# Patient Record
Sex: Female | Born: 1970 | Race: White | Hispanic: No | Marital: Married | Smoking: Never smoker
Health system: Southern US, Community
[De-identification: ages and names within clinical notes are randomized; demographics above are authoritative.]

## PROBLEM LIST (undated history)

## (undated) DIAGNOSIS — D219 Benign neoplasm of connective and other soft tissue, unspecified: Secondary | ICD-10-CM

## (undated) DIAGNOSIS — F419 Anxiety disorder, unspecified: Secondary | ICD-10-CM

## (undated) DIAGNOSIS — F410 Panic disorder [episodic paroxysmal anxiety] without agoraphobia: Secondary | ICD-10-CM

## (undated) DIAGNOSIS — R87612 Low grade squamous intraepithelial lesion on cytologic smear of cervix (LGSIL): Secondary | ICD-10-CM

## (undated) DIAGNOSIS — I10 Essential (primary) hypertension: Secondary | ICD-10-CM

## (undated) DIAGNOSIS — T4145XA Adverse effect of unspecified anesthetic, initial encounter: Secondary | ICD-10-CM

## (undated) HISTORY — DX: Panic disorder (episodic paroxysmal anxiety): F41.0

## (undated) HISTORY — DX: Low grade squamous intraepithelial lesion on cytologic smear of cervix (LGSIL): R87.612

## (undated) HISTORY — DX: Essential (primary) hypertension: I10

## (undated) HISTORY — PX: WISDOM TOOTH EXTRACTION: SHX21

## (undated) HISTORY — DX: Anxiety disorder, unspecified: F41.9

## (undated) HISTORY — DX: Benign neoplasm of connective and other soft tissue, unspecified: D21.9

---
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
## 1989-07-15 HISTORY — PX: CHOLECYSTECTOMY: SHX55

## 1996-07-15 HISTORY — PX: LAPAROSCOPIC OVARIAN CYSTECTOMY: SHX6248

## 1999-07-16 DIAGNOSIS — R87612 Low grade squamous intraepithelial lesion on cytologic smear of cervix (LGSIL): Secondary | ICD-10-CM

## 1999-07-16 HISTORY — DX: Low grade squamous intraepithelial lesion on cytologic smear of cervix (LGSIL): R87.612

## 1999-07-16 HISTORY — PX: LASIK: SHX215

## 2015-01-18 ENCOUNTER — Ambulatory Visit: Payer: 59 | Attending: Gynecologic Oncology | Admitting: Gynecologic Oncology

## 2015-01-18 ENCOUNTER — Encounter: Payer: Self-pay | Admitting: Gynecologic Oncology

## 2015-01-18 ENCOUNTER — Other Ambulatory Visit (HOSPITAL_COMMUNITY)
Admission: RE | Admit: 2015-01-18 | Discharge: 2015-01-18 | Disposition: A | Discharge status: Home / Self Care | Payer: 59 | Source: Ambulatory Visit | Attending: Gynecologic Oncology | Admitting: Gynecologic Oncology

## 2015-01-18 VITALS — BP 166/91 | HR 74 | Temp 98.2°F | Ht 63.5 in | Wt 183.9 lb

## 2015-01-18 DIAGNOSIS — R1900 Intra-abdominal and pelvic swelling, mass and lump, unspecified site: Secondary | ICD-10-CM | POA: Insufficient documentation

## 2015-01-18 DIAGNOSIS — N80129 Deep endometriosis of ovary, unspecified ovary: Secondary | ICD-10-CM | POA: Insufficient documentation

## 2015-01-18 DIAGNOSIS — D219 Benign neoplasm of connective and other soft tissue, unspecified: Secondary | ICD-10-CM | POA: Insufficient documentation

## 2015-01-18 DIAGNOSIS — N839 Noninflammatory disorder of ovary, fallopian tube and broad ligament, unspecified: Secondary | ICD-10-CM | POA: Diagnosis not present

## 2015-01-18 DIAGNOSIS — D259 Leiomyoma of uterus, unspecified: Secondary | ICD-10-CM | POA: Insufficient documentation

## 2015-01-18 DIAGNOSIS — Z01411 Encounter for gynecological examination (general) (routine) with abnormal findings: Secondary | ICD-10-CM | POA: Insufficient documentation

## 2015-01-18 DIAGNOSIS — R971 Elevated cancer antigen 125 [CA 125]: Secondary | ICD-10-CM | POA: Diagnosis not present

## 2015-01-18 DIAGNOSIS — Z1151 Encounter for screening for human papillomavirus (HPV): Secondary | ICD-10-CM | POA: Insufficient documentation

## 2015-01-18 DIAGNOSIS — N838 Other noninflammatory disorders of ovary, fallopian tube and broad ligament: Secondary | ICD-10-CM

## 2015-01-18 DIAGNOSIS — N801 Endometriosis of ovary: Secondary | ICD-10-CM | POA: Insufficient documentation

## 2015-01-18 NOTE — Progress Notes (Signed)
Consult Note: Gyn-Onc  Dana Livingston 44 y.o. female  CC:  Chief Complaint  Patient presents with  . ovarian mass    HPI: Patient is seen today in consultation at the request of Dr. Molli Posey.  Patient is a 44 year old gravida 0 whose last menstrual period was 01/10/2015. She continues to currently have regular cycles then about 4-5 months ago she missed 2 cycles. She took some herbal supplements and that her cycles became normal again. On May 27 when she was lying down she felt an abdominal pelvic mass. She was seen by Dr. Matthew Saras on June 23. At that time she had an ultrasound that revealed intramural fibroids measuring 3.8 cm, 2.6 cm, and 2 cm. The left ovary had a 4.4 x 3 cm cyst filled with low level echoes. It is felt that it could be an endometrioma. In addition there was a 3.4 x 2.1 cm cyst with low-level internal echoes and a thin septation which could be a hemorrhagic cyst or an endometrioma. Within the right ovary there was a 9.7 x 7 x 8.2 cm complex cyst with septations. There is no blood flow seen within the cyst. There was blood flow seen within the ovaries however there is no free fluid is CA-125 was performed it was 177.4.  It is for this reason that she is referred to [place] today. She states that since she first noticed the mass is not changed in any way. She does have some pain in the right lower quadrant. She has some urinary frequency. She's not sure that she'll need to her fibroids or this mass. She does drink a lot of teas that have dandilion that may or may not be contributing to some increased urinary frequency. She does not drink coffee as it causes GI distress but drink caffeinated beverages. She did have some issues with her stools and was worked up for this in [place]. This issue is improved completely when she stopped drinking coffee. She denies any early satiety. She's on a vegan diet. She has regular bowel movements. She weighed well over 200 pounds in the past but  modified her diet and was able to lose weight.  She has not been doing her mammograms. Her Pap smear was last performed 4 years ago. She had a Pap smear showing low-grade dysplasia in 2001.  Review of Systems  Constitutional: Denies fever. Denies any hot flashes but she states that she gets hot or more often than she used to. Skin: No rash Cardiovascular: No chest pain, shortness of breath, or edema  Pulmonary: No cough Gastro Intestinal: Reporting intermittent lower abdominal soreness.  No nausea, vomiting, constipation, or diarrhea reported.  Genitourinary: Increased urinary frequency, no urgency, or dysuria.  Cycles as above Psychology: Increased anxiety related to the mass.  Current Meds:  Outpatient Encounter Prescriptions as of 01/18/2015  Medication Sig  . ALPRAZolam (XANAX) 0.25 MG tablet TK 1 T PO  Q 8 H PRA  . Chaste Tree (VITEX EXTRACT PO) Take 2 capsules by mouth daily.  . SOY ISOFLAVONE PO Take 1 capsule by mouth daily.  . TURMERIC PO Take 2 tablets by mouth daily.   No facility-administered encounter medications on file as of 01/18/2015.    Allergy:  Allergies  Allergen Reactions  . Ciprofloxacin Palpitations  . Tetracyclines & Related Nausea And Vomiting    Social Hx:   History   Social History  . Marital Status: Married    Spouse Name: N/A  . Number of Children:  N/A  . Years of Education: N/A   Occupational History  . Not on file.   Social History Main Topics  . Smoking status: Never Smoker   . Smokeless tobacco: Not on file  . Alcohol Use: No  . Drug Use: No  . Sexual Activity: Yes   Other Topics Concern  . Not on file   Social History Narrative  . No narrative on file    Past Surgical Hx:  Past Surgical History  Procedure Laterality Date  . Cholecystectomy  1991  . Tonsillectomy  1981  . Laparoscopic ovarian cystectomy Left 1998    Past Medical Hx:  Past Medical History  Diagnosis Date  . Anxiety   . Low grade squamous intraepithelial  lesion (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 2001    normal pap smears since 2002    Oncology Hx:   No history exists.    Family Hx:  Family History  Problem Relation Age of Onset  . Thyroid disease Mother   . COPD Father   . Hypertension Father   . Diabetes Father   . Heart disease Father   . Lung cancer Maternal Grandmother   . COPD Maternal Grandfather   . Colon cancer Paternal Uncle     Vitals:  Blood pressure 166/91, pulse 74, temperature 98.2 F (36.8 C), temperature source Oral, height 5' 3.5" (1.613 m), weight 183 lb 14.4 oz (83.416 kg), SpO2 98 %.  Physical Exam: Well-nourished well-developed female in no acute distress.  Neck: Supple, no lymphadenopathy, no thyromegaly.  Lungs: Clear to auscultation bilaterally. Cardiac: Regular rate and rhythm.  Abdomen: Well-healed right upper quadrant transverse incision. No evidence of an incisional hernia. No fluid wave. Abdomen is soft, nontender, nondistended. Within the right lower quadrant right midabdomen there is approximately 10 cm abdominal pelvic mass that is palpable. It is freely mobile.  Groins: No lymphadenopathy.  Extremity: No edema.  Pelvic: Normal external genitalia. The vagina is well epithelialized. The cervix is visualized. Is nulliparous. There's no visible lesions. Pap smear was submitted without difficulty. Bimanual examination reveals the uterus to be mid plane. It is slightly distended measuring approximately 10 weeks size. There is a mass palpable above the uterus it is mobile with the uterus and appears to be separate likely consistent with a right ovarian mass. There is no mass appreciated on the left. Rectal confirms. There is no nodularity.  Assessment/Plan: 44 year old with a complex 9 cm right ovarian mass and a smaller left ovarian masses felt to be an endometrioma. She has known uterine fibroids and has had thwm for some time. Her CA-125 is elevated 177. I reviewed all of this with the patient and her  husband. Greater than 30 minutes face to face time with counseling alone was spent. I discussed with her that I believe that the risk of malignancy is low (proximally 5%) but not 0 and I would recommend surgical evaluation. I discussed with him proceeding with a robotic-assisted right oophorectomy. I do not believe we could proceed with an ovarian cystectomy without risk of rupture. The ovary be delivered through the abdomen except for frozen section. If the ovaries benign, we will evaluate the left ovary referred for consideration of a cystectomy and then conclude the procedure she will be able to go home. If the right ovary has a malignancy I would recommend hysterectomy with removal of the contralateral ovary and fallopian tube and surgical staging.  She had a lot of questions regarding this and they were addressed. We'll  address the role of performing a hysterectomy in the setting of cancer and she ultimately agreed. We discussed the role of preoperative imaging with a CT scan. Based on her exam and the fact that there was no significant free fluid on the ultrasound identified do not believe that a CT was significantly change our surgical approach. She would not be able to proceed in a minimally invasive fashion due to adhesive disease or other factors we would proceed with a laparotomy. She was able to undergo left ovarian cystectomy after her laparotomy there was no significant adhesive disease based on her report.  I did discuss with them that I will be out of the country starting this coming Sunday will not be back to operate until August 2. She was offered surgery in [place] with one of my partners with an earlier date. We otherwise do not have any OR availability here in [place] until August 2. She and her husband will call us back letting us know how they wish to proceed.  We discussed length of stay. If her surgery is consistent only with an oophorectomy should be able to go home the same  day. If we need to proceed with more extensive surgery she notes that she would be in the hospital overnight and discharge when she meets postoperative course. We discussed FMLA paperwork and that we are happy to complete that for her if she send it to [place]. Again, her questions were elicited in answer to her satisfaction. She and her husband will notify us once he make a decision regarding the timing of surgery and where they would like to have it performed. She did express interest in having me to her surgery as she had the opportunity to meet with me today.  Nehal Witting A., MD 01/18/2015, 3:54 PM

## 2015-01-18 NOTE — Patient Instructions (Signed)
Please contact our office at [phone] with your decision to proceed with surgery in [facility] on August 2 with Dr. Alycia Rossetti or sooner at [facility].

## 2015-01-20 ENCOUNTER — Telehealth: Payer: Self-pay | Admitting: *Deleted

## 2015-01-20 LAB — CYTOLOGY - PAP

## 2015-01-20 NOTE — Telephone Encounter (Signed)
Notified pt Pap smear results were normal. Pt verbalized understanding no further concerns.

## 2015-02-08 NOTE — Patient Instructions (Addendum)
Dana Livingston  02/08/2015   Your procedure is scheduled on: Tuesday 02/14/2015  Report to [facility] Main  Entrance take [facility]  elevators to 3rd floor to  [place] at [place] AM.  Call this number if you have problems the morning of surgery [phone]   Remember: ONLY 1 PERSON MAY GO WITH YOU TO SHORT STAY TO GET  READY MORNING OF [place].  Do not eat food or drink liquids :After Midnight.               PLEASE FOLLOW CLEAR LIQUID DIET ALL DAY ON Monday 02/13/2015 TIL MIDNIGHT!   Take these medicines the morning of surgery with A SIP OF WATER: none                               You may not have any metal on your body including hair pins and              piercings  Do not wear jewelry, make-up, lotions, powders or perfumes, deodorant             Do not wear nail polish.  Do not shave  48 hours prior to surgery.              Men may shave face and neck.   Do not bring valuables to the hospital. [place].  Contacts, dentures or bridgework may not be worn into surgery.  Leave suitcase in the car. After surgery it may be brought to your room.     Patients discharged the day of surgery will not be allowed to drive home.  Name and phone number of your driver:  Special Instructions: N/A              Please read over the following fact sheets you were given: _____________________________________________________________________             [facility] - Preparing for Surgery Before surgery, you can play an important role.  Because skin is not sterile, your skin needs to be as free of germs as possible.  You can reduce the number of germs on your skin by washing with CHG (chlorahexidine gluconate) soap before surgery.  CHG is an antiseptic cleaner which kills germs and bonds with the skin to continue killing germs even after washing. Please DO NOT use if you have an allergy to CHG or antibacterial soaps.   If your skin becomes reddened/irritated stop using the CHG and inform your nurse when you arrive at Short Stay. Do not shave (including legs and underarms) for at least 48 hours prior to the first CHG shower.  You may shave your face/neck. Please follow these instructions carefully:  1.  Shower with CHG Soap the night before surgery and the  morning of Surgery.  2.  If you choose to wash your hair, wash your hair first as usual with your  normal  shampoo.  3.  After you shampoo, rinse your hair and body thoroughly to remove the  shampoo.                           4.  Use CHG as you would any other liquid soap.  You  can apply chg directly  to the skin and wash                       Gently with a scrungie or clean washcloth.  5.  Apply the CHG Soap to your body ONLY FROM THE NECK DOWN.   Do not use on face/ open                           Wound or open sores. Avoid contact with eyes, ears mouth and genitals (private parts).                       Wash face,  Genitals (private parts) with your normal soap.             6.  Wash thoroughly, paying special attention to the area where your surgery  will be performed.  7.  Thoroughly rinse your body with warm water from the neck down.  8.  DO NOT shower/wash with your normal soap after using and rinsing off  the CHG Soap.                9.  Pat yourself dry with a clean towel.            10.  Wear clean pajamas.            11.  Place clean sheets on your bed the night of your first shower and do not  sleep with pets. Day of Surgery : Do not apply any lotions/deodorants the morning of surgery.  Please wear clean clothes to the hospital/surgery center.  FAILURE TO FOLLOW THESE INSTRUCTIONS MAY RESULT IN THE CANCELLATION OF YOUR SURGERY PATIENT SIGNATURE_________________________________  NURSE SIGNATURE__________________________________  ________________________________________________________________________   Adam Phenix  An incentive  spirometer is a tool that can help keep your lungs clear and active. This tool measures how well you are filling your lungs with each breath. Taking long deep breaths may help reverse or decrease the chance of developing breathing (pulmonary) problems (especially infection) following:  A long period of time when you are unable to move or be active. BEFORE THE PROCEDURE   If the spirometer includes an indicator to show your best effort, your nurse or respiratory therapist will set it to a desired goal.  If possible, sit up straight or lean slightly forward. Try not to slouch.  Hold the incentive spirometer in an upright position. INSTRUCTIONS FOR USE   Sit on the edge of your bed if possible, or sit up as far as you can in bed or on a chair.  Hold the incentive spirometer in an upright position.  Breathe out normally.  Place the mouthpiece in your mouth and seal your lips tightly around it.  Breathe in slowly and as deeply as possible, raising the piston or the ball toward the top of the column.  Hold your breath for 3-5 seconds or for as long as possible. Allow the piston or ball to fall to the bottom of the column.  Remove the mouthpiece from your mouth and breathe out normally.  Rest for a few seconds and repeat Steps 1 through 7 at least 10 times every 1-2 hours when you are awake. Take your time and take a few normal breaths between deep breaths.  The spirometer may include an indicator to show your best effort. Use the indicator as a goal to work toward during  each repetition.  After each set of 10 deep breaths, practice coughing to be sure your lungs are clear. If you have an incision (the cut made at the time of surgery), support your incision when coughing by placing a pillow or rolled up towels firmly against it. Once you are able to get out of bed, walk around indoors and cough well. You may stop using the incentive spirometer when instructed by your caregiver.  RISKS AND  COMPLICATIONS  Take your time so you do not get dizzy or light-headed.  If you are in pain, you may need to take or ask for pain medication before doing incentive spirometry. It is harder to take a deep breath if you are having pain. AFTER USE  Rest and breathe slowly and easily.  It can be helpful to keep track of a log of your progress. Your caregiver can provide you with a simple table to help with this. If you are using the spirometer at home, follow these instructions: [place] IF:   You are having difficultly using the spirometer.  You have trouble using the spirometer as often as instructed.  Your pain medication is not giving enough relief while using the spirometer.  You develop fever of 100.5 F (38.1 C) or higher. SEEK IMMEDIATE MEDICAL CARE IF:   You cough up bloody sputum that had not been present before.  You develop fever of 102 F (38.9 C) or greater.  You develop worsening pain at or near the incision site. MAKE SURE YOU:   Understand these instructions.  Will watch your condition.  Will get help right away if you are not doing well or get worse. Document Released: 11/11/2006 Document Revised: 09/23/2011 Document Reviewed: 01/12/2007 ExitCare Patient Information 2014 ExitCare, [place].   ________________________________________________________________________  WHAT IS A BLOOD TRANSFUSION? Blood Transfusion Information  A transfusion is the replacement of blood or some of its parts. Blood is made up of multiple cells which provide different functions.  Red blood cells carry oxygen and are used for blood loss replacement.  White blood cells fight against infection.  Platelets control bleeding.  Plasma helps clot blood.  Other blood products are available for specialized needs, such as hemophilia or other clotting disorders. BEFORE THE TRANSFUSION  Who gives blood for transfusions?   Healthy volunteers who are fully evaluated to make sure  their blood is safe. This is blood bank blood. Transfusion therapy is the safest it has ever been in the practice of medicine. Before blood is taken from a donor, a complete history is taken to make sure that person has no history of diseases nor engages in risky social behavior (examples are intravenous drug use or sexual activity with multiple partners). The donor's travel history is screened to minimize risk of transmitting infections, such as malaria. The donated blood is tested for signs of infectious diseases, such as HIV and hepatitis. The blood is then tested to be sure it is compatible with you in order to minimize the chance of a transfusion reaction. If you or a relative donates blood, this is often done in anticipation of surgery and is not appropriate for emergency situations. It takes many days to process the donated blood. RISKS AND COMPLICATIONS Although transfusion therapy is very safe and saves many lives, the main dangers of transfusion include:   Getting an infectious disease.  Developing a transfusion reaction. This is an allergic reaction to something in the blood you were given. Every precaution is taken to prevent  this. The decision to have a blood transfusion has been considered carefully by your caregiver before blood is given. Blood is not given unless the benefits outweigh the risks. AFTER THE TRANSFUSION  Right after receiving a blood transfusion, you will usually feel much better and more energetic. This is especially true if your red blood cells have gotten low (anemic). The transfusion raises the level of the red blood cells which carry oxygen, and this usually causes an energy increase.  The nurse administering the transfusion will monitor you carefully for complications. HOME CARE INSTRUCTIONS  No special instructions are needed after a transfusion. You may find your energy is better. Speak with your caregiver about any limitations on activity for underlying diseases  you may have. SEEK MEDICAL CARE IF:   Your condition is not improving after your transfusion.  You develop redness or irritation at the intravenous (IV) site. SEEK IMMEDIATE MEDICAL CARE IF:  Any of the following symptoms occur over the next 12 hours:  Shaking chills.  You have a temperature by mouth above 102 F (38.9 C), not controlled by medicine.  Chest, back, or muscle pain.  People around you feel you are not acting correctly or are confused.  Shortness of breath or difficulty breathing.  Dizziness and fainting.  You get a rash or develop hives.  You have a decrease in urine output.  Your urine turns a dark color or changes to pink, red, or brown. Any of the following symptoms occur over the next 10 days:  You have a temperature by mouth above 102 F (38.9 C), not controlled by medicine.  Shortness of breath.  Weakness after normal activity.  The white part of the eye turns yellow (jaundice).  You have a decrease in the amount of urine or are urinating less often.  Your urine turns a dark color or changes to pink, red, or brown. Document Released: 06/28/2000 Document Revised: 09/23/2011 Document Reviewed: 02/15/2008 ExitCare Patient Information 2014 [place], [place].  _______________________________________________________________________   CLEAR LIQUID DIET       -Follow day before surgery on Monday 02/13/2015   Foods Allowed                                                                     Foods Excluded  Coffee and tea, regular and decaf                             liquids that you cannot  Plain Jell-O in any flavor                                             see through such as: Fruit ices (not with fruit pulp)                                     milk, soups, orange juice  Iced Popsicles  All solid food Carbonated beverages, regular and diet                                    Cranberry, grape and apple  juices Sports drinks like Gatorade Lightly seasoned clear broth or consume(fat free) Sugar, honey syrup  Sample Menu Breakfast                                Lunch                                     Supper Cranberry juice                    Beef broth                            Chicken broth Jell-O                                     Grape juice                           Apple juice Coffee or tea                        Jell-O                                      Popsicle                                                Coffee or tea                        Coffee or tea  _____________________________________________________________________

## 2015-02-09 ENCOUNTER — Encounter (HOSPITAL_COMMUNITY): Payer: Self-pay

## 2015-02-09 ENCOUNTER — Encounter (HOSPITAL_COMMUNITY)
Admission: RE | Admit: 2015-02-09 | Discharge: 2015-02-09 | Disposition: A | Discharge status: Home / Self Care | Payer: 59 | Source: Ambulatory Visit | Attending: Obstetrics & Gynecology | Admitting: Obstetrics & Gynecology

## 2015-02-09 DIAGNOSIS — Z3200 Encounter for pregnancy test, result unknown: Secondary | ICD-10-CM | POA: Diagnosis not present

## 2015-02-09 HISTORY — DX: Adverse effect of unspecified anesthetic, initial encounter: T41.45XA

## 2015-02-09 LAB — URINALYSIS, ROUTINE W REFLEX MICROSCOPIC
Bilirubin Urine: NEGATIVE
Glucose, UA: NEGATIVE mg/dL
Ketones, ur: NEGATIVE mg/dL
LEUKOCYTES UA: NEGATIVE
NITRITE: NEGATIVE
PH: 6 (ref 5.0–8.0)
PROTEIN: NEGATIVE mg/dL
Specific Gravity, Urine: 1.006 (ref 1.005–1.030)
Urobilinogen, UA: 0.2 mg/dL (ref 0.0–1.0)

## 2015-02-09 LAB — COMPREHENSIVE METABOLIC PANEL
ALT: 12 U/L — ABNORMAL LOW (ref 14–54)
AST: 19 U/L (ref 15–41)
Albumin: 4.1 g/dL (ref 3.5–5.0)
Alkaline Phosphatase: 77 U/L (ref 38–126)
Anion gap: 6 (ref 5–15)
BUN: 11 mg/dL (ref 6–20)
CO2: 25 mmol/L (ref 22–32)
Calcium: 9.1 mg/dL (ref 8.9–10.3)
Chloride: 106 mmol/L (ref 101–111)
Creatinine, Ser: 0.67 mg/dL (ref 0.44–1.00)
GFR calc Af Amer: >60 mL/min (ref >60)
GLUCOSE: 97 mg/dL (ref 65–99)
POTASSIUM: 4.1 mmol/L (ref 3.5–5.1)
SODIUM: 137 mmol/L (ref 135–145)
Total Bilirubin: 0.4 mg/dL (ref 0.3–1.2)
Total Protein: 7.3 g/dL (ref 6.5–8.1)

## 2015-02-09 LAB — CBC WITH DIFFERENTIAL/PLATELET
Basophils Absolute: 0.1 10*3/uL (ref 0.0–0.1)
Basophils Relative: 1 % (ref 0–1)
Eosinophils Absolute: 0.3 10*3/uL (ref 0.0–0.7)
Eosinophils Relative: 3 % (ref 0–5)
HCT: 35.6 % — ABNORMAL LOW (ref 36.0–46.0)
HEMOGLOBIN: 11.5 g/dL — AB (ref 12.0–15.0)
Lymphocytes Relative: 19 % (ref 12–46)
Lymphs Abs: 1.9 10*3/uL (ref 0.7–4.0)
MCH: 28.2 pg (ref 26.0–34.0)
MCHC: 32.3 g/dL (ref 30.0–36.0)
MCV: 87.3 fL (ref 78.0–100.0)
Monocytes Absolute: 0.9 10*3/uL (ref 0.1–1.0)
Monocytes Relative: 9 % (ref 3–12)
NEUTROS ABS: 6.8 10*3/uL (ref 1.7–7.7)
Neutrophils Relative %: 68 % (ref 43–77)
PLATELETS: 338 10*3/uL (ref 150–400)
RBC: 4.08 MIL/uL (ref 3.87–5.11)
RDW: 12.6 % (ref 11.5–15.5)
WBC: 9.9 10*3/uL (ref 4.0–10.5)

## 2015-02-09 LAB — URINE MICROSCOPIC-ADD ON

## 2015-02-09 LAB — PREGNANCY, URINE: PREG TEST UR: NEGATIVE

## 2015-02-09 LAB — ABO/RH: ABO/RH(D): O POS

## 2015-02-14 ENCOUNTER — Ambulatory Visit (HOSPITAL_COMMUNITY): Payer: 59 | Admitting: Anesthesiology

## 2015-02-14 ENCOUNTER — Encounter (HOSPITAL_COMMUNITY)
Admission: RE | Disposition: A | Discharge status: Home / Self Care | Payer: Self-pay | Source: Ambulatory Visit | Attending: Obstetrics & Gynecology

## 2015-02-14 ENCOUNTER — Encounter (HOSPITAL_COMMUNITY): Payer: Self-pay | Admitting: Anesthesiology

## 2015-02-14 ENCOUNTER — Ambulatory Visit (HOSPITAL_COMMUNITY)
Admission: RE | Admit: 2015-02-14 | Discharge: 2015-02-14 | Disposition: A | Discharge status: Home / Self Care | Payer: 59 | Source: Ambulatory Visit | Attending: Obstetrics & Gynecology | Admitting: Obstetrics & Gynecology

## 2015-02-14 DIAGNOSIS — D252 Subserosal leiomyoma of uterus: Secondary | ICD-10-CM

## 2015-02-14 DIAGNOSIS — N801 Endometriosis of ovary: Secondary | ICD-10-CM | POA: Diagnosis not present

## 2015-02-14 DIAGNOSIS — D259 Leiomyoma of uterus, unspecified: Secondary | ICD-10-CM | POA: Diagnosis present

## 2015-02-14 DIAGNOSIS — R971 Elevated cancer antigen 125 [CA 125]: Secondary | ICD-10-CM

## 2015-02-14 DIAGNOSIS — N809 Endometriosis, unspecified: Secondary | ICD-10-CM | POA: Diagnosis not present

## 2015-02-14 DIAGNOSIS — N802 Endometriosis of fallopian tube: Secondary | ICD-10-CM | POA: Insufficient documentation

## 2015-02-14 DIAGNOSIS — N838 Other noninflammatory disorders of ovary, fallopian tube and broad ligament: Secondary | ICD-10-CM

## 2015-02-14 HISTORY — PX: ROBOTIC ASSISTED LAPAROSCOPIC OVARIAN CYSTECTOMY: SHX6081

## 2015-02-14 HISTORY — PX: ROBOTIC ASSISTED SALPINGO OOPHERECTOMY: SHX6082

## 2015-02-14 HISTORY — PX: ROBOTIC ASSISTED TOTAL HYSTERECTOMY WITH BILATERAL SALPINGO OOPHERECTOMY: SHX6086

## 2015-02-14 LAB — TYPE AND SCREEN
ABO/RH(D): O POS
Antibody Screen: NEGATIVE

## 2015-02-14 SURGERY — SALPINGO-OOPHORECTOMY, ROBOT-ASSISTED
Anesthesia: General | Site: Abdomen | Laterality: Right

## 2015-02-14 MED ORDER — KETOROLAC TROMETHAMINE 15 MG/ML IJ SOLN: 15.0000 mg | Freq: Four times a day (QID) | INTRAMUSCULAR | Status: DC

## 2015-02-14 MED ORDER — GLYCOPYRROLATE 0.2 MG/ML IJ SOLN
INTRAMUSCULAR | Status: DC | PRN
  Administered 2015-02-14: .6 mg via INTRAVENOUS

## 2015-02-14 MED ORDER — ROCURONIUM BROMIDE 100 MG/10ML IV SOLN
INTRAVENOUS | Status: AC
  Filled 2015-02-14: qty 1

## 2015-02-14 MED ORDER — MORPHINE SULFATE 10 MG/ML IJ SOLN: 2.0000 mg | INTRAMUSCULAR | Status: DC | PRN

## 2015-02-14 MED ORDER — GLYCOPYRROLATE 0.2 MG/ML IJ SOLN
INTRAMUSCULAR | Status: AC
Start: 2015-02-14 — End: 2015-02-14
  Filled 2015-02-14: qty 3

## 2015-02-14 MED ORDER — FENTANYL CITRATE (PF) 250 MCG/5ML IJ SOLN
INTRAMUSCULAR | Status: AC
  Filled 2015-02-14: qty 25

## 2015-02-14 MED ORDER — FENTANYL CITRATE (PF) 100 MCG/2ML IJ SOLN
25.0000 ug | INTRAMUSCULAR | Status: DC | PRN
  Administered 2015-02-14 (×2): 25 ug via INTRAVENOUS
  Administered 2015-02-14: 50 ug via INTRAVENOUS

## 2015-02-14 MED ORDER — FENTANYL CITRATE (PF) 100 MCG/2ML IJ SOLN
INTRAMUSCULAR | Status: DC | PRN
  Administered 2015-02-14: 100 ug via INTRAVENOUS
  Administered 2015-02-14 (×3): 50 ug via INTRAVENOUS

## 2015-02-14 MED ORDER — PROPOFOL 10 MG/ML IV BOLUS
INTRAVENOUS | Status: DC | PRN
  Administered 2015-02-14: 160 mg via INTRAVENOUS

## 2015-02-14 MED ORDER — LIDOCAINE HCL (CARDIAC) 20 MG/ML IV SOLN
INTRAVENOUS | Status: AC
  Filled 2015-02-14: qty 5

## 2015-02-14 MED ORDER — MIDAZOLAM HCL 2 MG/2ML IJ SOLN
INTRAMUSCULAR | Status: AC
  Filled 2015-02-14: qty 4

## 2015-02-14 MED ORDER — NEOSTIGMINE METHYLSULFATE 10 MG/10ML IV SOLN
INTRAVENOUS | Status: AC
  Filled 2015-02-14: qty 1

## 2015-02-14 MED ORDER — LIDOCAINE HCL (CARDIAC) 20 MG/ML IV SOLN
INTRAVENOUS | Status: DC | PRN
  Administered 2015-02-14: 50 mg via INTRAVENOUS

## 2015-02-14 MED ORDER — MIDAZOLAM HCL 5 MG/5ML IJ SOLN
INTRAMUSCULAR | Status: DC | PRN
  Administered 2015-02-14: 2 mg via INTRAVENOUS

## 2015-02-14 MED ORDER — KETOROLAC TROMETHAMINE 15 MG/ML IJ SOLN
15.0000 mg | Freq: Four times a day (QID) | INTRAMUSCULAR | Status: DC
  Administered 2015-02-14: 15 mg via INTRAVENOUS
  Filled 2015-02-14: qty 1

## 2015-02-14 MED ORDER — STERILE WATER FOR IRRIGATION IR SOLN
Status: DC | PRN
  Administered 2015-02-14: 1000 mL

## 2015-02-14 MED ORDER — MEPERIDINE HCL 50 MG/ML IJ SOLN: 6.2500 mg | INTRAMUSCULAR | Status: DC | PRN

## 2015-02-14 MED ORDER — LACTATED RINGERS IV SOLN: INTRAVENOUS | Status: DC

## 2015-02-14 MED ORDER — PROPOFOL 10 MG/ML IV BOLUS
INTRAVENOUS | Status: AC
  Filled 2015-02-14: qty 20

## 2015-02-14 MED ORDER — NEOSTIGMINE METHYLSULFATE 10 MG/10ML IV SOLN
INTRAVENOUS | Status: DC | PRN
  Administered 2015-02-14: 3.5 mg via INTRAVENOUS

## 2015-02-14 MED ORDER — HYDROMORPHONE HCL 1 MG/ML IJ SOLN
INTRAMUSCULAR | Status: DC | PRN
  Administered 2015-02-14: 0.5 mg via INTRAVENOUS
  Administered 2015-02-14: 1 mg via INTRAVENOUS
  Administered 2015-02-14: 0.5 mg via INTRAVENOUS

## 2015-02-14 MED ORDER — DEXAMETHASONE SODIUM PHOSPHATE 10 MG/ML IJ SOLN
INTRAMUSCULAR | Status: DC | PRN
  Administered 2015-02-14: 10 mg via INTRAVENOUS

## 2015-02-14 MED ORDER — LACTATED RINGERS IR SOLN
Status: DC | PRN
  Administered 2015-02-14: 1000 mL

## 2015-02-14 MED ORDER — FENTANYL CITRATE (PF) 100 MCG/2ML IJ SOLN
INTRAMUSCULAR | Status: AC
  Filled 2015-02-14: qty 2

## 2015-02-14 MED ORDER — PROMETHAZINE HCL 25 MG/ML IJ SOLN: 6.2500 mg | INTRAMUSCULAR | Status: DC | PRN

## 2015-02-14 MED ORDER — ROCURONIUM BROMIDE 100 MG/10ML IV SOLN
INTRAVENOUS | Status: DC | PRN
  Administered 2015-02-14 (×2): 10 mg via INTRAVENOUS
  Administered 2015-02-14: 50 mg via INTRAVENOUS
  Administered 2015-02-14: 20 mg via INTRAVENOUS

## 2015-02-14 MED ORDER — ACETAMINOPHEN 325 MG PO TABS: 650.0000 mg | ORAL_TABLET | ORAL | Status: DC | PRN

## 2015-02-14 MED ORDER — ACETAMINOPHEN 650 MG RE SUPP
650.0000 mg | RECTAL | Status: DC | PRN
  Filled 2015-02-14: qty 1

## 2015-02-14 MED ORDER — SODIUM CHLORIDE 0.9 % IJ SOLN: 3.0000 mL | Freq: Two times a day (BID) | INTRAMUSCULAR | Status: DC

## 2015-02-14 MED ORDER — METOCLOPRAMIDE HCL 5 MG/ML IJ SOLN
10.0000 mg | Freq: Once | INTRAMUSCULAR | Status: AC
  Administered 2015-02-14: 10 mg via INTRAVENOUS
  Filled 2015-02-14: qty 2

## 2015-02-14 MED ORDER — ONDANSETRON HCL 4 MG/2ML IJ SOLN
INTRAMUSCULAR | Status: AC
  Filled 2015-02-14: qty 2

## 2015-02-14 MED ORDER — HYDROMORPHONE HCL 2 MG/ML IJ SOLN
INTRAMUSCULAR | Status: AC
  Filled 2015-02-14: qty 1

## 2015-02-14 MED ORDER — SODIUM CHLORIDE 0.9 % IV SOLN: 250.0000 mL | INTRAVENOUS | Status: DC | PRN

## 2015-02-14 MED ORDER — SODIUM CHLORIDE 0.9 % IJ SOLN: 3.0000 mL | INTRAMUSCULAR | Status: DC | PRN

## 2015-02-14 MED ORDER — ONDANSETRON HCL 4 MG/2ML IJ SOLN
INTRAMUSCULAR | Status: DC | PRN
  Administered 2015-02-14: 4 mg via INTRAVENOUS

## 2015-02-14 MED ORDER — OXYCODONE-ACETAMINOPHEN 5-325 MG PO TABS: 1.0000 | ORAL_TABLET | Freq: Four times a day (QID) | ORAL | Status: DC | PRN

## 2015-02-14 MED ORDER — LACTATED RINGERS IV SOLN
INTRAVENOUS | Status: DC
  Administered 2015-02-14: 1000 mL via INTRAVENOUS
  Administered 2015-02-14: 10:00:00 via INTRAVENOUS

## 2015-02-14 MED ORDER — OXYCODONE HCL 5 MG PO TABS
5.0000 mg | ORAL_TABLET | ORAL | Status: DC | PRN
  Administered 2015-02-14: 5 mg via ORAL
  Filled 2015-02-14: qty 1

## 2015-02-14 SURGICAL SUPPLY — 53 items
BENZOIN TINCTURE PRP APPL 2/3 (GAUZE/BANDAGES/DRESSINGS) ×4 IMPLANT
CABLE HIGH FREQUENCY MONO STRZ (ELECTRODE) ×4 IMPLANT
CHLORAPREP W/TINT 26ML (MISCELLANEOUS) ×4 IMPLANT
CORDS BIPOLAR (ELECTRODE) ×4 IMPLANT
COVER SURGICAL LIGHT HANDLE (MISCELLANEOUS) ×4 IMPLANT
COVER TIP SHEARS 8 DVNC (MISCELLANEOUS) ×3 IMPLANT
COVER TIP SHEARS 8MM DA VINCI (MISCELLANEOUS) ×1
DRAPE SHEET LG 3/4 BI-LAMINATE (DRAPES) ×8 IMPLANT
DRAPE SURG IRRIG POUCH 19X23 (DRAPES) ×4 IMPLANT
DRAPE TABLE BACK 44X90 PK DISP (DRAPES) ×8 IMPLANT
DRAPE UTILITY XL STRL (DRAPES) ×4 IMPLANT
DRAPE WARM FLUID 44X44 (DRAPE) ×4 IMPLANT
DRSG TEGADERM 2-3/8X2-3/4 SM (GAUZE/BANDAGES/DRESSINGS) ×16 IMPLANT
DRSG TEGADERM 4X4.75 (GAUZE/BANDAGES/DRESSINGS) ×4 IMPLANT
DRSG TEGADERM 6X8 (GAUZE/BANDAGES/DRESSINGS) ×4 IMPLANT
ELECT REM PT RETURN 9FT ADLT (ELECTROSURGICAL) ×4
ELECTRODE REM PT RTRN 9FT ADLT (ELECTROSURGICAL) ×3 IMPLANT
GAUZE SPONGE 2X2 8PLY STRL LF (GAUZE/BANDAGES/DRESSINGS) ×3 IMPLANT
GLOVE BIO SURGEON STRL SZ 6.5 (GLOVE) ×16 IMPLANT
GLOVE BIO SURGEON STRL SZ7.5 (GLOVE) IMPLANT
GLOVE BIOGEL PI IND STRL 7.0 (GLOVE) ×6 IMPLANT
GLOVE BIOGEL PI INDICATOR 7.0 (GLOVE) ×2
GOWN STRL REUS W/ TWL XL LVL3 (GOWN DISPOSABLE) ×6 IMPLANT
GOWN STRL REUS W/TWL XL LVL3 (GOWN DISPOSABLE) ×2
HOLDER FOLEY CATH W/STRAP (MISCELLANEOUS) ×4 IMPLANT
KIT ACCESSORY DA VINCI DISP (KITS) ×1
KIT ACCESSORY DVNC DISP (KITS) ×3 IMPLANT
KIT BASIN OR (CUSTOM PROCEDURE TRAY) ×4 IMPLANT
MANIPULATOR UTERINE 4.5 ZUMI (MISCELLANEOUS) ×4 IMPLANT
OCCLUDER COLPOPNEUMO (BALLOONS) ×4 IMPLANT
POUCH SPECIMEN RETRIEVAL 10MM (ENDOMECHANICALS) ×4 IMPLANT
SET TUBE IRRIG SUCTION NO TIP (IRRIGATION / IRRIGATOR) ×4 IMPLANT
SHEET LAVH (DRAPES) ×4 IMPLANT
SOLUTION ANTI FOG 6CC (MISCELLANEOUS) ×4 IMPLANT
SOLUTION ELECTROLUBE (MISCELLANEOUS) ×4 IMPLANT
SPONGE GAUZE 2X2 STER 10/PKG (GAUZE/BANDAGES/DRESSINGS) ×1
SPONGE LAP 18X18 X RAY DECT (DISPOSABLE) IMPLANT
STRIP CLOSURE SKIN 1/2X4 (GAUZE/BANDAGES/DRESSINGS) IMPLANT
SUT VIC AB 0 CT1 27 (SUTURE) ×1
SUT VIC AB 0 CT1 27XBRD ANTBC (SUTURE) ×3 IMPLANT
SUT VIC AB 4-0 PS2 27 (SUTURE) ×8 IMPLANT
SUT VICRYL 0 UR6 27IN ABS (SUTURE) ×4 IMPLANT
SYR 50ML LL SCALE MARK (SYRINGE) ×4 IMPLANT
SYR BULB IRRIGATION 50ML (SYRINGE) IMPLANT
TOWEL OR 17X26 10 PK STRL BLUE (TOWEL DISPOSABLE) ×8 IMPLANT
TRAP SPECIMEN MUCOUS 40CC (MISCELLANEOUS) ×4 IMPLANT
TRAY FOLEY W/METER SILVER 14FR (SET/KITS/TRAYS/PACK) ×4 IMPLANT
TRAY LAPAROSCOPIC (CUSTOM PROCEDURE TRAY) ×4 IMPLANT
TROCAR 12M 150ML BLUNT (TROCAR) ×4 IMPLANT
TROCAR BLADELESS OPT 5 100 (ENDOMECHANICALS) ×4 IMPLANT
TROCAR XCEL 12X100 BLDLESS (ENDOMECHANICALS) ×4 IMPLANT
TUBING INSUFFLATION 10FT LAP (TUBING) ×4 IMPLANT
WATER STERILE IRR 1500ML POUR (IV SOLUTION) IMPLANT

## 2015-02-14 NOTE — Anesthesia Postprocedure Evaluation (Signed)
  Anesthesia Post-op Note  Patient: Dana Livingston  Procedure(s) Performed: Procedure(s) (LRB): ROBOTIC ASSISTED RIGHT OOPHORECTOMY  (Bilateral)  Patient Location: PACU  Anesthesia Type: General  Level of Consciousness: awake and alert   Airway and Oxygen Therapy: Patient Spontanous Breathing  Post-op Pain: mild  Post-op Assessment: Post-op Vital signs reviewed, Patient's Cardiovascular Status Stable, Respiratory Function Stable, Patent Airway and No signs of Nausea or vomiting  Last Vitals:  Filed Vitals:   02/14/15 1152  BP: 129/70  Pulse: 61  Temp: 36.4 C  Resp: 14    Post-op Vital Signs: stable   Complications: No apparent anesthesia complications

## 2015-02-14 NOTE — Transfer of Care (Signed)
Immediate Anesthesia Transfer of Care Note  Patient: Dana Livingston  Procedure(s) Performed: Procedure(s): ROBOTIC ASSISTED RIGHT OOPHORECTOMY  (Bilateral)  Patient Location: PACU  Anesthesia Type:General  Level of Consciousness:  sedated, patient cooperative and responds to stimulation  Airway & Oxygen Therapy:Patient Spontanous Breathing and Patient connected to face mask oxgen  Post-op Assessment:  Report given to PACU RN and Post -op Vital signs reviewed and stable  Post vital signs:  Reviewed and stable  Last Vitals:  Filed Vitals:   02/14/15 0526  BP: 150/96  Pulse: 78  Temp: 36.4 C  Resp: 18    Complications: No apparent anesthesia complications

## 2015-02-14 NOTE — Progress Notes (Signed)
Attempted to get patient out of bed to BR to void. Became nauseated, dizzy and llight headed when stretcher raised to sitting position. Stated she was going to eat a little crystalized ginger she brought from home for the nausea. It did not relieve her and she asked for something for nausea. Call into Dr Marcell Barlow for PRN

## 2015-02-14 NOTE — Progress Notes (Signed)
Bruises on right upper arm noted from BP cuff

## 2015-02-14 NOTE — Discharge Instructions (Signed)
Unilateral Salpingo-Oophorectomy, Care After Refer to this sheet in the next few weeks. These instructions provide you with information on caring for yourself after your procedure. Your health care provider may also give you more specific instructions. Your treatment has been planned according to current medical practices, but problems sometimes occur. Call your health care provider if you have any problems or questions after your procedure. WHAT TO EXPECT AFTER THE PROCEDURE After your procedure, it is typical to have the following:  Abdominal pain that can be controlled with pain medicine.  Vaginal spotting.  Constipation. HOME CARE INSTRUCTIONS   Get plenty of rest and sleep.  Only take over-the-counter or prescription medicines as directed by your health care provider. Do not take aspirin. It can cause bleeding.  Keep incision areas clean and dry. Remove or change any bandages (dressings) only as directed by your health care provider.  Follow your health care provider's advice regarding diet.  Drink enough fluids to keep your urine clear or pale yellow.  Limit exercise and activities as directed by your health care provider. Do not lift anything heavier than 5 pounds (2.3 kg) until your health care provider approves.  Do not drive until your health care provider approves.  Do not drink alcohol until your health care provider approves.  Do not have sexual intercourse until your health care provider says it is OK.  Take your temperature twice a day and write it down.  If you become constipated, you may:  Ask your health care provider about taking a mild laxative.  Add more fruit and bran to your diet.  Drink more fluids.  Follow up with your health care provider as directed. SEEK MEDICAL CARE IF:   You have swelling or redness in the incision area.  You develop a rash.  You feel lightheaded.  You have pain that is not controlled with medicine.  You have pain,  swelling, or redness where the IV access tube was placed. SEEK IMMEDIATE MEDICAL CARE IF:  You have a fever.  You develop increasing abdominal pain.  You see pus coming out of the incision, or the incision is separating.  You notice a bad smell coming from the wound or dressing.  You have excessive vaginal bleeding.  You feel sick to your stomach (nauseous) and vomit.  You have leg or chest pain.  You have pain when you urinate.  You develop shortness of breath.  You pass out. Document Released: 04/27/2009 Document Revised: 04/21/2013 Document Reviewed: 12/23/2012 [address] [facility] Patient Information 2015 [place], [place]. This information is not intended to replace advice given to you by your health care provider. Make sure you discuss any questions you have with your health care provider.                                                   PATIENT INSTRUCTIONS POST-ANESTHESIA  IMMEDIATELY FOLLOWING SURGERY:  Do not drive or operate machinery for the first twenty four hours after surgery.  Do not make any important decisions for twenty four hours after surgery or while taking narcotic pain medications or sedatives.  If you develop intractable nausea and vomiting or a severe headache please notify your doctor immediately.  FOLLOW-UP:  Please make an appointment with your surgeon as instructed.

## 2015-02-14 NOTE — H&P (View-Only) (Signed)
Consult Note: Gyn-Onc  Dana Livingston 44 y.o. female  CC:  Chief Complaint  Patient presents with  . ovarian mass    HPI: Patient is seen today in consultation at the request of Dr. Molli Posey.  Patient is a 44 year old gravida 0 whose last menstrual period was 01/10/2015. She continues to currently have regular cycles then about 4-5 months ago she missed 2 cycles. She took some herbal supplements and that her cycles became normal again. On May 27 when she was lying down she felt an abdominal pelvic mass. She was seen by Dr. Matthew Saras on June 23. At that time she had an ultrasound that revealed intramural fibroids measuring 3.8 cm, 2.6 cm, and 2 cm. The left ovary had a 4.4 x 3 cm cyst filled with low level echoes. It is felt that it could be an endometrioma. In addition there was a 3.4 x 2.1 cm cyst with low-level internal echoes and a thin septation which could be a hemorrhagic cyst or an endometrioma. Within the right ovary there was a 9.7 x 7 x 8.2 cm complex cyst with septations. There is no blood flow seen within the cyst. There was blood flow seen within the ovaries however there is no free fluid is CA-125 was performed it was 177.4.  It is for this reason that she is referred to [place] today. She states that since she first noticed the mass is not changed in any way. She does have some pain in the right lower quadrant. She has some urinary frequency. She's not sure that she'll need to her fibroids or this mass. She does drink a lot of teas that have dandilion that may or may not be contributing to some increased urinary frequency. She does not drink coffee as it causes GI distress but drink caffeinated beverages. She did have some issues with her stools and was worked up for this in [place]. This issue is improved completely when she stopped drinking coffee. She denies any early satiety. She's on a vegan diet. She has regular bowel movements. She weighed well over 200 pounds in the past but  modified her diet and was able to lose weight.  She has not been doing her mammograms. Her Pap smear was last performed 4 years ago. She had a Pap smear showing low-grade dysplasia in 2001.  Review of Systems  Constitutional: Denies fever. Denies any hot flashes but she states that she gets hot or more often than she used to. Skin: No rash Cardiovascular: No chest pain, shortness of breath, or edema  Pulmonary: No cough Gastro Intestinal: Reporting intermittent lower abdominal soreness.  No nausea, vomiting, constipation, or diarrhea reported.  Genitourinary: Increased urinary frequency, no urgency, or dysuria.  Cycles as above Psychology: Increased anxiety related to the mass.  Current Meds:  Outpatient Encounter Prescriptions as of 01/18/2015  Medication Sig  . ALPRAZolam (XANAX) 0.25 MG tablet TK 1 T PO  Q 8 H PRA  . Chaste Tree (VITEX EXTRACT PO) Take 2 capsules by mouth daily.  . SOY ISOFLAVONE PO Take 1 capsule by mouth daily.  . TURMERIC PO Take 2 tablets by mouth daily.   No facility-administered encounter medications on file as of 01/18/2015.    Allergy:  Allergies  Allergen Reactions  . Ciprofloxacin Palpitations  . Tetracyclines & Related Nausea And Vomiting    Social Hx:   History   Social History  . Marital Status: Married    Spouse Name: N/A  . Number of Children:  N/A  . Years of Education: N/A   Occupational History  . Not on file.   Social History Main Topics  . Smoking status: Never Smoker   . Smokeless tobacco: Not on file  . Alcohol Use: No  . Drug Use: No  . Sexual Activity: Yes   Other Topics Concern  . Not on file   Social History Narrative  . No narrative on file    Past Surgical Hx:  Past Surgical History  Procedure Laterality Date  . Cholecystectomy  1991  . Tonsillectomy  1981  . Laparoscopic ovarian cystectomy Left 1998    Past Medical Hx:  Past Medical History  Diagnosis Date  . Anxiety   . Low grade squamous intraepithelial  lesion (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 2001    normal pap smears since 2002    Oncology Hx:   No history exists.    Family Hx:  Family History  Problem Relation Age of Onset  . Thyroid disease Mother   . COPD Father   . Hypertension Father   . Diabetes Father   . Heart disease Father   . Lung cancer Maternal Grandmother   . COPD Maternal Grandfather   . Colon cancer Paternal Uncle     Vitals:  Blood pressure 166/91, pulse 74, temperature 98.2 F (36.8 C), temperature source Oral, height 5' 3.5" (1.613 m), weight 183 lb 14.4 oz (83.416 kg), SpO2 98 %.  Physical Exam: Well-nourished well-developed female in no acute distress.  Neck: Supple, no lymphadenopathy, no thyromegaly.  Lungs: Clear to auscultation bilaterally. Cardiac: Regular rate and rhythm.  Abdomen: Well-healed right upper quadrant transverse incision. No evidence of an incisional hernia. No fluid wave. Abdomen is soft, nontender, nondistended. Within the right lower quadrant right midabdomen there is approximately 10 cm abdominal pelvic mass that is palpable. It is freely mobile.  Groins: No lymphadenopathy.  Extremity: No edema.  Pelvic: Normal external genitalia. The vagina is well epithelialized. The cervix is visualized. Is nulliparous. There's no visible lesions. Pap smear was submitted without difficulty. Bimanual examination reveals the uterus to be mid plane. It is slightly distended measuring approximately 10 weeks size. There is a mass palpable above the uterus it is mobile with the uterus and appears to be separate likely consistent with a right ovarian mass. There is no mass appreciated on the left. Rectal confirms. There is no nodularity.  Assessment/Plan: 44 year old with a complex 9 cm right ovarian mass and a smaller left ovarian masses felt to be an endometrioma. She has known uterine fibroids and has had thwm for some time. Her CA-125 is elevated 177. I reviewed all of this with the patient and her  husband. Greater than 30 minutes face to face time with counseling alone was spent. I discussed with her that I believe that the risk of malignancy is low (proximally 5%) but not 0 and I would recommend surgical evaluation. I discussed with him proceeding with a robotic-assisted right oophorectomy. I do not believe we could proceed with an ovarian cystectomy without risk of rupture. The ovary be delivered through the abdomen except for frozen section. If the ovaries benign, we will evaluate the left ovary referred for consideration of a cystectomy and then conclude the procedure she will be able to go home. If the right ovary has a malignancy I would recommend hysterectomy with removal of the contralateral ovary and fallopian tube and surgical staging.  She had a lot of questions regarding this and they were addressed. We'll  address the role of performing a hysterectomy in the setting of cancer and she ultimately agreed. We discussed the role of preoperative imaging with a CT scan. Based on her exam and the fact that there was no significant free fluid on the ultrasound identified do not believe that a CT was significantly change our surgical approach. She would not be able to proceed in a minimally invasive fashion due to adhesive disease or other factors we would proceed with a laparotomy. She was able to undergo left ovarian cystectomy after her laparotomy there was no significant adhesive disease based on her report.  I did discuss with them that I will be out of the country starting this coming Sunday will not be back to operate until August 2. She was offered surgery in [place] with one of my partners with an earlier date. We otherwise do not have any OR availability here in [place] until August 2. She and her husband will call us back letting us know how they wish to proceed.  We discussed length of stay. If her surgery is consistent only with an oophorectomy should be able to go home the same  day. If we need to proceed with more extensive surgery she notes that she would be in the hospital overnight and discharge when she meets postoperative course. We discussed FMLA paperwork and that we are happy to complete that for her if she send it to [place]. Again, her questions were elicited in answer to her satisfaction. She and her husband will notify us once he make a decision regarding the timing of surgery and where they would like to have it performed. She did express interest in having me to her surgery as she had the opportunity to meet with me today.  Neysa Arts A., MD 01/18/2015, 3:54 PM

## 2015-02-14 NOTE — Anesthesia Preprocedure Evaluation (Signed)
Anesthesia Evaluation  Patient identified by MRN, date of birth, ID band Patient awake    Reviewed: Allergy & Precautions, NPO status , Patient's Chart, lab work & pertinent test results  Airway Mallampati: II  TM Distance: >3 FB Neck ROM: Full    Dental no notable dental hx.    Pulmonary neg pulmonary ROS,  breath sounds clear to auscultation  Pulmonary exam normal       Cardiovascular negative cardio ROS Normal cardiovascular examRhythm:Regular Rate:Normal     Neuro/Psych negative neurological ROS  negative psych ROS   GI/Hepatic negative GI ROS, Neg liver ROS,   Endo/Other  negative endocrine ROS  Renal/GU negative Renal ROS  negative genitourinary   Musculoskeletal negative musculoskeletal ROS (+)   Abdominal   Peds negative pediatric ROS (+)  Hematology negative hematology ROS (+)   Anesthesia Other Findings   Reproductive/Obstetrics negative OB ROS                             Anesthesia Physical Anesthesia Plan  ASA: II  Anesthesia Plan: General   Post-op Pain Management:    Induction: Intravenous  Airway Management Planned: Oral ETT  Additional Equipment:   Intra-op Plan:   Post-operative Plan: Extubation in OR  Informed Consent: I have reviewed the patients History and Physical, chart, labs and discussed the procedure including the risks, benefits and alternatives for the proposed anesthesia with the patient or authorized representative who has indicated his/her understanding and acceptance.   Dental advisory given  Plan Discussed with: CRNA  Anesthesia Plan Comments:         Anesthesia Quick Evaluation

## 2015-02-14 NOTE — Op Note (Signed)
PATIENT: Dana Livingston DATE OF BIRTH: 04-08-1971 ENCOUNTER DATE: 02/14/15   Preop Diagnosis: Complex pelvic mass, elevated CA-125  Postoperative Diagnosis: Endometrioma, fibroid uterus  Surgery: Right salpingo-oophorectomy, drainage of left ovarian cyst. 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 Alycia Rossetti, MD; Lahoma Crocker, MD   Anesthesia: General   Estimated blood loss: 50 ml   IVF:1200  ml   Urine output: 381 ml   Complications: None   Pathology: Right adnexa  Operative findings: 10 cm right ovarian mass consistent with endometrioma with hydrosalpinx on the right. Fibroid uterus. 3 cm left sided endometrioma. Small implants of endometriosis. Normal abdominal survey. Frozen section consistent with hemorrhagic cyst and endometriosis.  Procedure: The patient was identified in the preoperative holding area. Informed consent was signed on the chart. Patient was seen history was reviewed and exam was performed.   The patient was then taken to the operating room and placed in the supine position with SCD hose on. She was then placed in the dorsolithotomy position. Her arms were tucked at her side with appropriate precautions on the gel pad. General anesthesia was then induced without difficulty. Shoulder blocks were then placed in the usual fashion with appropriate precautions. A OG-tube was placed to suction. First timeout was performed to confirm the patient, procedure, antibiotic, allergy status, estimated blood loss and OR time. The perineum was then prepped in the usual fashion with Betadine. A 14 French Foley was inserted into the bladder under sterile conditions. A sterile speculum was placed in the vagina. The cervix was without lesions. The cervix was grasped with a single-tooth tenaculum. The dilator without difficulty. A ZUMI with a medium Koe ring was placed without difficulty. The abdomen was then prepped with 1 Chlor prep sponges per protocol.   Patient  was then draped after the prep was dried. Second timeout was performed to confirm the above. After again confirming OG tube placement and it was to suction. A stab-wound was made in left upper quadrant 2 cm below the costal margin on the left in the midclavicular line. A 5 mm operative report was used to assure intra-abdominal placement. The abdomen was insufflated. At this point all points during the procedure the patient's intra-abdominal pressure was not increased over 15 mm of mercury. After insufflation was complete, the patient was placed in deep Trendelenburg position. 25 cm above the pubic symphysis that area was marked the camera port. Bilateral robotic ports were marked 10 cm from the midline incision at approximately 5 angle. Under direct visualization each of the trochars was placed into the abdomen. The small bowel was folded on its mesentery to allow visualization to the pelvis. The 5 mm LUQ port was then converted to a 10/12 port under direct visualization.  After assuring adequate visualization, the robot was then docked in the usual fashion. Under direct visualization the robotic instruments replaced.   Abdominal washing were obtained.The posterior leaf of the broad ligament on the right was aken down in the usual fashion. The ureter was identified on the medial leaf of the broad ligament. A window was made between the IP and the ureter. The IP was coagulated with bipolar cautery and transected. Using careful dissection for one hour, the ovary was freed from the right pelvic sidewall, posterior cul de sac and posterior uterus. The uteroovarian was coagulated with bipolar cautery and transected. There was unavoidable rupture of the ovarian mass during the dissection and manipulation. Chocolate fluid returned. The mass was placed in an endocatch bag and  delivered through the assistant port. It was sent for frozen section. We did need to slightly expand the assist port fascia to deliver the mass.  The left ovarian cyst was drained. The other small areas of endometriosis in the posterior cul de sac were obliterated with cautery.  Frozen section returned as benign and consistent with endometriosis and per our extensive pre-operative consultation, she desired to retain her uterus and contralateral adnexa.  The abdomen and pelvis were copiously irrigated and noted to be hemostatic. The robotic instruments were removed under direct visualization as were the robotic trochars. The pneumoperitoneum was removed. The patient was then taken out of the Trendelenburg position. Using of 0 Vicryl on a UR 6 needle the midline port fascia was closed after being grasped with allis clamps. The subcutaneous tissues of the port in the left upper quadrant and its fascia were reapproximated with a 0 vicryl. The skin was closed using 4-0 Vicryl. Steri-Strips and benzoin were applied. The ZUMI and Koh ring were removed. The cervix and vagina were hemostatic.  All instrument needle and Ray-Tec counts were correct x2. The patient tolerated the procedure well and was taken to the recovery room in stable condition. This is Dana Livingston dictating an operative note on patient Dana Livingston.

## 2015-02-14 NOTE — Interval H&P Note (Signed)
History and Physical Interval Note:  02/14/2015 7:32 AM  Dana Livingston  has presented today for surgery, with the diagnosis of OVARIAN MASS   The various methods of treatment have been discussed with the patient and family. After consideration of risks, benefits and other options for treatment, the patient has consented to  Procedure(s): ROBOTIC ASSISTED RIGHT OOPHORECTOMY POSS LEFT OVARIAN CYSTECTOMY POSS TOTAL HYSTERECTOMY POSS  BILATERAL SALPINGO OOPHORECTOMY POSS STAGING (Bilateral) as a surgical intervention .  The patient's history has been reviewed, patient examined, no change in status, stable for surgery.  I have reviewed the patient's chart and labs.  Questions were answered to the patient's satisfaction.     [place] A.

## 2015-02-14 NOTE — Anesthesia Procedure Notes (Addendum)
Procedure Name: Intubation Date/Time: 02/14/2015 7:50 AM Performed by: Anne Fu Pre-anesthesia Checklist: Patient identified, Emergency Drugs available, Suction available, Patient being monitored and Timeout performed Patient Re-evaluated:Patient Re-evaluated prior to inductionOxygen Delivery Method: Circle system utilized Preoxygenation: Pre-oxygenation with 100% oxygen Intubation Type: IV induction Ventilation: Mask ventilation without difficulty Laryngoscope Size: Mac and 3 Grade View: Grade I Tube type: Oral Tube size: 7.5 mm Number of attempts: 1 Airway Equipment and Method: Stylet Placement Confirmation: ETT inserted through vocal cords under direct vision,  positive ETCO2,  CO2 detector and breath sounds checked- equal and bilateral Secured at: 19 cm Tube secured with: Tape Dental Injury: Teeth and Oropharynx as per pre-operative assessment

## 2015-02-15 ENCOUNTER — Telehealth: Payer: Self-pay | Admitting: Gynecologic Oncology

## 2015-02-15 ENCOUNTER — Encounter (HOSPITAL_COMMUNITY): Payer: Self-pay | Admitting: Gynecologic Oncology

## 2015-02-15 NOTE — Telephone Encounter (Signed)
Returned call to the patient.  Stating she is having mild post-operative soreness which is relieved with aleve use.  She threw up yesterday afternoon but has tolerated tofu pudding at 1 am and toast this am.  She urinated a large amount at 1 am and again at 6 am this morning.  She feels like she is emptying her bladder and sits longer when urinating to completely empty.  Reports improvement in shoulder pain.  Patient asking about return to work date.  Wanting to go back to work on Monday if possible.  Advised to call at the end of the week and see how she feels then a letter can be sent to her work if she is persistent about returning to work.  She will call for any questions or concerns.

## 2015-02-16 ENCOUNTER — Telehealth: Payer: Self-pay | Admitting: *Deleted

## 2015-02-16 NOTE — Telephone Encounter (Signed)
Received VM from patient requesting return call in regards to a new symptom she is having. Called and spoke with patient - she states she had a fever last night of 100.3 and felt like earlier in the day she was not able to empty her bladder completely. She reports emptying her bladder last night and that her fever went back down to normal. She denies having a fever or difficulty urinating this morning. Told patient that if her fever returns or if she has any additional symptoms including burning with urination or foul smelling urine to please call our office back - patient agreeable to this.

## 2015-02-17 ENCOUNTER — Telehealth: Payer: Self-pay | Admitting: Gynecologic Oncology

## 2015-02-17 NOTE — Telephone Encounter (Signed)
Returned call to patient.  Patient reporting "doing better."  She had some mild lightheadedness last pm but her BP at that time was 130/83 and temp 98.2.  Advised to monitor the lightheadedness and to call if it persists or worsens.  Felling better this am with mild pain.  Wanting to call the beginning on next week to discuss return to work date.  Advised to call for any questions or concerns.

## 2015-02-20 ENCOUNTER — Encounter: Payer: Self-pay | Admitting: *Deleted

## 2015-02-20 ENCOUNTER — Telehealth: Payer: Self-pay | Admitting: *Deleted

## 2015-02-20 NOTE — Telephone Encounter (Signed)
Received call from patient stating she will ready to return to work on 02-27-15. Return to work letter signed by Joylene John, NP faxed to [phone] attn: Jimmy Picket per patient request.

## 2015-02-21 ENCOUNTER — Telehealth: Payer: Self-pay | Admitting: Nurse Practitioner

## 2015-02-21 NOTE — Telephone Encounter (Signed)
Patient calling to ask about the appearance of her surgical incision. S/p procedure on 02/14/15; largest incision appears "concave, puckered, and like there is a divot in the skin." She also reports "skin folds over on itself when I bend over." Patient rates pain 2/10 at the site and denies swelling, redness, or drainage. Per [facility], this is normal healing process; also encourage patient not to bend sharply. Patient informed of this and verbalizes understanding; she is encouraged to call clinic with any new or worsening symptoms or signs of infection which were reviewed with the patient.

## 2015-03-15 ENCOUNTER — Encounter: Payer: Self-pay | Admitting: Gynecologic Oncology

## 2015-03-15 ENCOUNTER — Ambulatory Visit: Payer: 59 | Attending: Gynecologic Oncology | Admitting: Gynecologic Oncology

## 2015-03-15 VITALS — BP 164/88 | HR 70 | Temp 98.1°F | Resp 16 | Ht 63.5 in | Wt 184.6 lb

## 2015-03-15 DIAGNOSIS — N809 Endometriosis, unspecified: Secondary | ICD-10-CM

## 2015-03-15 DIAGNOSIS — Z9889 Other specified postprocedural states: Secondary | ICD-10-CM | POA: Insufficient documentation

## 2015-03-15 MED ORDER — HYDROCORTISONE 2.5 % RE CREA: 1.0000 "application " | TOPICAL_CREAM | Freq: Two times a day (BID) | RECTAL | Status: DC | PRN

## 2015-03-15 NOTE — Progress Notes (Signed)
Consult Note: Gyn-Onc  Dana Livingston 44 y.o. female  CC:  Chief Complaint  Patient presents with  . Routine Post Op    HPI: Dr. Molli Posey.  Patient is a 44 year old gravida 0 whose last menstrual period was 01/10/2015. She continues to currently have regular cycles then about 4-5 months ago she missed 2 cycles. She took some herbal supplements and that her cycles became normal again. On May 27 when she was lying down she felt an abdominal pelvic mass. She was seen by Dr. Matthew Saras on June 23. At that time she had an ultrasound that revealed intramural fibroids measuring 3.8 cm, 2.6 cm, and 2 cm. The left ovary had a 4.4 x 3 cm cyst filled with low level echoes. It is felt that it could be an endometrioma. In addition there was a 3.4 x 2.1 cm cyst with low-level internal echoes and a thin septation which could be a hemorrhagic cyst or an endometrioma. Within the right ovary there was a 9.7 x 7 x 8.2 cm complex cyst with septations. There is no blood flow seen within the cyst. There was blood flow seen within the ovaries however there is no free fluid is CA-125 was performed it was 177.4.  It is for this reason that she is referred to [place] today. She states that since she first noticed the mass is not changed in any way. She does have some pain in the right lower quadrant. She has some urinary frequency. She's not sure that she'll need to her fibroids or this mass. She does drink a lot of teas that have dandilion that may or may not be contributing to some increased urinary frequency. She does not drink coffee as it causes GI distress but drink caffeinated beverages. She did have some issues with her stools and was worked up for this in [place]. This issue is improved completely when she stopped drinking coffee. She denies any early satiety. She's on a vegan diet. She has regular bowel movements. She weighed well over 200 pounds in the past but modified her diet and was able to lose  weight.  02/14/15:  Surgery: Right salpingo-oophorectomy, drainage of left ovarian cyst. 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 Fibroid uterus. 3 cm left sided endometrioma. Small implants of endometriosis. Normal abdominal survey. Frozen section consistent with hemorrhagic cyst and endometriosis.  Pathology: Diagnosis Ovary and fallopian tube, right - BENIGN OVARY WITH ENDOMETRIOMA. - BENIGN FALLOPIAN TUBE WITH ENDOMETRIOSIS.   She comes in today for her postoperative visit. She states that the first week was "rough" in that she does not tolerate anesthesia very well. She returned to work after week and states she feels pretty good now. She did have a menstrual cycle on time that was short. She is complaining of some soreness in the left upper quadrant incision as well as some retraction of the skin. She is having some episodes of anxiety and some palpitations which is relieved with Ativan.   Current Meds:  Outpatient Encounter Prescriptions as of 03/15/2015  Medication Sig  . ALPRAZolam (XANAX) 0.25 MG tablet Take 0.25 mg by mouth 2 (two) times daily as needed for anxiety.  . Chaste Tree (VITEX EXTRACT PO) Take 2 capsules by mouth daily.  . hydrocortisone (ANUSOL-HC) 2.5 % rectal cream Place 1 application rectally 2 (two) times daily as needed for hemorrhoids.  . SOY ISOFLAVONE PO Take 1 capsule by mouth daily.  Marland Kitchen  TURMERIC PO Take 2 tablets by mouth daily.  . naproxen sodium (ANAPROX) 220 MG tablet Take 220 mg by mouth 2 (two) times daily as needed (pain).  . [DISCONTINUED] oxyCODONE-acetaminophen (PERCOCET) 5-325 MG per tablet Take 1-2 tablets by mouth every 6 (six) hours as needed for severe pain.   No facility-administered encounter medications on file as of 03/15/2015.    Allergy:  Allergies  Allergen Reactions  . Ciprofloxacin Palpitations  . Tetracyclines & Related Nausea  And Vomiting    Social Hx:   Social History   Social History  . Marital Status: Married    Spouse Name: N/A  . Number of Children: N/A  . Years of Education: N/A   Occupational History  . Not on file.   Social History Main Topics  . Smoking status: Never Smoker   . Smokeless tobacco: Not on file  . Alcohol Use: No  . Drug Use: No  . Sexual Activity: Yes   Other Topics Concern  . Not on file   Social History Narrative    Past Surgical Hx:  Past Surgical History  Procedure Laterality Date  . Cholecystectomy  1991  . Tonsillectomy  1981  . Laparoscopic ovarian cystectomy Left 1998  . Wisdom tooth extraction    . Tonsillectomy      age 65  . Robotic assisted total hysterectomy with bilateral salpingo oopherectomy Bilateral 02/14/2015    Procedure: ROBOTIC ASSISTED RIGHT OOPHORECTOMY ;  Surgeon: [name], MD;  Location: [facility];  Service: Gynecology;  Laterality: Bilateral;    Past Medical Hx:  Past Medical History  Diagnosis Date  . Anxiety   . 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 2001    normal pap smears since 2002  . Complication of anesthesia     for wisdom teeth extracted in office, could not get her under enough so had to do at hospital    Oncology Hx:   No history exists.    Family Hx:  Family History  Problem Relation Age of Onset  . Thyroid disease Mother   . COPD Father   . Hypertension Father   . Diabetes Father   . Heart disease Father   . Lung cancer Maternal Grandmother   . COPD Maternal Grandfather   . Colon cancer Paternal Uncle     Vitals:  Blood pressure 164/88, pulse 70, temperature 98.1 F (36.7 C), temperature source Oral, resp. rate 16, height 5' 3.5" (1.613 m), weight 184 lb 9.6 oz (83.734 kg), SpO2 100 %.  Physical Exam: Well-nourished well-developed female in no acute distress.  Abdomen: Well-healed right upper quadrant transverse incision. Well-healed surgical incisions. Small amount of  retraction of the left upper quadrant incision. No appreciable hernias  Assessment/Plan: 44 year old with a complex 9 cm right ovarian mass and a smaller left ovarian mass that preoperatively was felt to be an endometrioma. She has known uterine fibroids and has had them for some time. Her CA-125 was elevated 177. Fortunately her pathology was consistent with an endometrioma which is what we suspected preoperatively. His overall doing well and we've released from my clinic. She does all be happy to see her in the future should the need arise. She'll identify primary care physician a follow-up with the palpitations that she is experiencing. She believes these were due to anxiety and stress. They're controlled with Ativan.     [name] A., MD 03/15/2015, 2:45 PM

## 2015-03-15 NOTE — Patient Instructions (Signed)
Dr. Benjie Karvonen Dr. Bobbye Charleston Dr. Darron Doom

## 2015-03-17 ENCOUNTER — Ambulatory Visit: Payer: 59 | Admitting: Gynecologic Oncology

## 2017-02-03 LAB — LIPID PANEL
Cholesterol: 149 (ref 0–200)
HDL: 40 (ref 35–70)
LDL Cholesterol: 86
Triglycerides: 113 (ref 40–160)

## 2017-02-03 LAB — BASIC METABOLIC PANEL WITH GFR: Glucose: 83

## 2017-02-03 LAB — HEMOGLOBIN A1C: HEMOGLOBIN A1C: 5.2

## 2017-02-04 DIAGNOSIS — J00 Acute nasopharyngitis [common cold]: Secondary | ICD-10-CM | POA: Diagnosis not present

## 2017-02-04 DIAGNOSIS — I10 Essential (primary) hypertension: Secondary | ICD-10-CM | POA: Diagnosis not present

## 2017-02-05 DIAGNOSIS — I10 Essential (primary) hypertension: Secondary | ICD-10-CM | POA: Diagnosis not present

## 2017-02-11 DIAGNOSIS — I10 Essential (primary) hypertension: Secondary | ICD-10-CM | POA: Diagnosis not present

## 2017-02-13 ENCOUNTER — Other Ambulatory Visit: Payer: Self-pay

## 2017-02-13 ENCOUNTER — Emergency Department: Payer: 59

## 2017-02-13 ENCOUNTER — Encounter: Payer: Self-pay | Admitting: Emergency Medicine

## 2017-02-13 ENCOUNTER — Emergency Department
Admission: EM | Admit: 2017-02-13 | Discharge: 2017-02-13 | Disposition: A | Discharge status: Home / Self Care | Payer: 59 | Attending: Student in an Organized Health Care Education/Training Program | Admitting: Student in an Organized Health Care Education/Training Program

## 2017-02-13 DIAGNOSIS — F439 Reaction to severe stress, unspecified: Secondary | ICD-10-CM | POA: Diagnosis not present

## 2017-02-13 DIAGNOSIS — R079 Chest pain, unspecified: Secondary | ICD-10-CM | POA: Diagnosis not present

## 2017-02-13 DIAGNOSIS — R0789 Other chest pain: Secondary | ICD-10-CM | POA: Diagnosis not present

## 2017-02-13 DIAGNOSIS — I10 Essential (primary) hypertension: Secondary | ICD-10-CM

## 2017-02-13 LAB — CBC
HCT: 39.3 % (ref 35.0–47.0)
HEMOGLOBIN: 13.5 g/dL (ref 12.0–16.0)
MCH: 30.7 pg (ref 26.0–34.0)
MCHC: 34.3 g/dL (ref 32.0–36.0)
MCV: 89.4 fL (ref 80.0–100.0)
PLATELETS: 353 10*3/uL (ref 150–440)
RBC: 4.4 MIL/uL (ref 3.80–5.20)
RDW: 12.9 % (ref 11.5–14.5)
WBC: 11.5 10*3/uL — ABNORMAL HIGH (ref 3.6–11.0)

## 2017-02-13 LAB — BASIC METABOLIC PANEL
ANION GAP: 8 (ref 5–15)
BUN: 8 mg/dL (ref 6–20)
CALCIUM: 9.8 mg/dL (ref 8.9–10.3)
CO2: 24 mmol/L (ref 22–32)
CREATININE: 0.72 mg/dL (ref 0.44–1.00)
Chloride: 101 mmol/L (ref 101–111)
Glucose, Bld: 114 mg/dL — ABNORMAL HIGH (ref 65–99)
Potassium: 3.7 mmol/L (ref 3.5–5.1)
Sodium: 133 mmol/L — ABNORMAL LOW (ref 135–145)

## 2017-02-13 LAB — TROPONIN I

## 2017-02-13 MED ORDER — ALPRAZOLAM 0.25 MG PO TABS: 0.2500 mg | ORAL_TABLET | Freq: Every evening | ORAL | 0 refills | Status: DC | PRN

## 2017-02-13 MED ORDER — LORAZEPAM 1 MG PO TABS
1.0000 mg | ORAL_TABLET | Freq: Once | ORAL | Status: AC
  Administered 2017-02-13: 1 mg via ORAL
  Filled 2017-02-13: qty 1

## 2017-02-13 NOTE — ED Provider Notes (Signed)
[facility] Emergency Department Provider Note    First MD Initiated Contact with Patient 02/13/17 2303     (approximate)  I have reviewed the triage vital signs and the nursing notes.   HISTORY  Chief Complaint Chest Pain    HPI Dana Livingston is a 46 y.o. female resents with intermittent chest pain and pressure that is nonradiating and has been ongoing for the past several nights whenever she lays down to go to sleep. Patient states she's been very worried about her elevated blood pressure that she just learned about the past several weeks and was started on Norvasc and subsequently atenolol she also has a history of anxiety. States that she's been unable to sleep because when she lies down she feels the chest pressure and she has an overwhelming fear that she can die at night. No previous history of heart attack. No history of high cholesterol. No diabetes. No family history of sudden cardiac death. Does not drink alcohol.   Past Medical History:  Diagnosis Date  . Anxiety   . Complication of anesthesia    for wisdom teeth extracted in office, could not get her under enough so had to do at hospital  . 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 2001   normal pap smears since 2002   Family History  Problem Relation Age of Onset  . Thyroid disease Mother   . COPD Father   . Hypertension Father   . Diabetes Father   . Heart disease Father   . Lung cancer Maternal Grandmother   . COPD Maternal Grandfather   . Colon cancer Paternal Uncle    Past Surgical History:  Procedure Laterality Date  . CHOLECYSTECTOMY  1991  . LAPAROSCOPIC OVARIAN CYSTECTOMY Left 1998  . ROBOTIC ASSISTED TOTAL HYSTERECTOMY WITH BILATERAL SALPINGO OOPHERECTOMY Bilateral 02/14/2015   Procedure: ROBOTIC ASSISTED RIGHT OOPHORECTOMY ;  Surgeon: [name], MD;  Location: [facility];  Service: Gynecology;  Laterality: Bilateral;  . TONSILLECTOMY  1981  .  TONSILLECTOMY     age 11  . WISDOM TOOTH EXTRACTION     Patient Active Problem List   Diagnosis Date Noted  . Ovarian mass, right 01/18/2015  . Elevated cancer antigen 125 (CA-125) 01/18/2015  . Fibroids 01/18/2015      Prior to Admission medications   Medication Sig Start Date End Date Taking? Authorizing Provider  ALPRAZolam (XANAX) 0.25 MG tablet Take 1 tablet (0.25 mg total) by mouth at bedtime as needed for anxiety. 02/13/17   Merlyn Lot, MD  Chaste Tree (VITEX EXTRACT PO) Take 2 capsules by mouth daily.    [provider]  hydrocortisone (PROCTO-MED HC) 2.5 % rectal cream Place 1 application rectally 2 (two) times daily as needed for hemorrhoids. 03/15/15   [name], MD  naproxen sodium (ANAPROX) 220 MG tablet Take 220 mg by mouth 2 (two) times daily as needed (pain).    [provider]  SOY ISOFLAVONE PO Take 1 capsule by mouth daily.    [provider]  TURMERIC PO Take 2 tablets by mouth daily.    [provider]    Allergies Ciprofloxacin and Tetracyclines & related    Social History Social History  Substance Use Topics  . Smoking status: Never Smoker  . Smokeless tobacco: Never Used  . Alcohol use No    Review of Systems Patient denies headaches, rhinorrhea, blurry vision, numbness, shortness of breath, chest pain, edema, cough, abdominal pain, nausea, vomiting,  diarrhea, dysuria, fevers, rashes or hallucinations unless otherwise stated above in HPI. ____________________________________________   PHYSICAL EXAM:  VITAL SIGNS: Vitals:   02/13/17 2242 02/13/17 2300  BP: (!) 173/102 (!) 175/81  Pulse: (!) 58 (!) 49  Resp: 11 12  Temp:      Constitutional: Alert and oriented. Well appearing and in no acute distress. Eyes: Conjunctivae are normal.  Head: Atraumatic. Nose: No congestion/rhinnorhea. Mouth/Throat: Mucous membranes are moist.   Neck: No stridor. Painless ROM.  Cardiovascular: Normal rate, regular  rhythm. Grossly normal heart sounds.  Good peripheral circulation. Respiratory: Normal respiratory effort.  No retractions. Lungs CTAB. Gastrointestinal: Soft and nontender. No distention. No abdominal bruits. No CVA tenderness. Genitourinary:  Musculoskeletal: No lower extremity tenderness nor edema.  No joint effusions. Neurologic:  Normal speech and language. No gross focal neurologic deficits are appreciated. No facial droop Skin:  Skin is warm, dry and intact. No rash noted. Psychiatric: Mood and affect are normal. Speech and behavior are normal.  ____________________________________________   LABS (all labs ordered are listed, but only abnormal results are displayed)  Results for orders placed or performed during the hospital encounter of 02/13/17 (from the past 24 hour(s))  Basic metabolic panel     Status: Abnormal   Collection Time: 02/13/17 10:10 PM  Result Value Ref Range   Sodium 133 (L) 135 - 145 mmol/L   Potassium 3.7 3.5 - 5.1 mmol/L   Chloride 101 101 - 111 mmol/L   CO2 24 22 - 32 mmol/L   Glucose, Bld 114 (H) 65 - 99 mg/dL   BUN 8 6 - 20 mg/dL   Creatinine, Ser 0.72 0.44 - 1.00 mg/dL   Calcium 9.8 8.9 - 10.3 mg/dL   GFR calc non Af Amer >60 >60 mL/min   GFR calc Af Amer >60 >60 mL/min   Anion gap 8 5 - 15  CBC     Status: Abnormal   Collection Time: 02/13/17 10:10 PM  Result Value Ref Range   WBC 11.5 (H) 3.6 - 11.0 K/uL   RBC 4.40 3.80 - 5.20 MIL/uL   Hemoglobin 13.5 12.0 - 16.0 g/dL   HCT 39.3 35.0 - 47.0 %   MCV 89.4 80.0 - 100.0 fL   MCH 30.7 26.0 - 34.0 pg   MCHC 34.3 32.0 - 36.0 g/dL   RDW 12.9 11.5 - 14.5 %   Platelets 353 150 - 440 K/uL  Troponin I     Status: None   Collection Time: 02/13/17 10:10 PM  Result Value Ref Range   Troponin I <0.03 <0.03 ng/mL   ____________________________________________  EKG My review and personal interpretation at Time: 22:08   Indication: chest pain  Rate: 60  Rhythm: sinus Axis: normal Other: normal ekg  with borderline bradycardia ____________________________________________  RADIOLOGY  I personally reviewed all radiographic images ordered to evaluate for the above acute complaints and reviewed radiology reports and findings.  These findings were personally discussed with the patient.  Please see medical record for radiology report.____________________________________________   PROCEDURES  Procedure(s) performed:  Procedures    Critical Care performed: no ____________________________________________   INITIAL IMPRESSION / ASSESSMENT AND PLAN / ED COURSE  Pertinent labs & imaging results that were available during my care of the patient were reviewed by me and considered in my medical decision making (see chart for details).  DDX: ACS, pericarditis, esophagitis, boerhaaves,dissection, pna, bronchitis, costochondritis    Dana Livingston is a 46 y.o. who presents to the ED with chest pain and  elevated blood pressures described above. Patient well-appearing and in no acute distress. She is low risk heart score of 2. Her EKG is nonischemic. Her troponin is negative and as she's been having chest pains at night for the past several nights. This is not clinically consistent with ACS. She is no evidence of pneumothorax. No evidence of pneumonia. This is not clinically consistent with dissection. Patient does appear very anxious and is describing overwhelming sense of doom. We'll give her a dose of Ativan. Patient's blood pressure is mildly elevated but she was recently started on medications. I do feel the patient is appropriate for follow-up with her PCP.  Have discussed with the patient and available family all diagnostics and treatments performed thus far and all questions were answered to the best of my ability. The patient demonstrates understanding and agreement with plan.       ____________________________________________   FINAL CLINICAL IMPRESSION(S) / ED DIAGNOSES  Final  diagnoses:  Chest pain, unspecified type  Stress  Hypertension, unspecified type      NEW MEDICATIONS STARTED DURING THIS VISIT:  Current Discharge Medication List       Note:  This document was prepared using Dragon voice recognition software and may include unintentional dictation errors.    Merlyn Lot, MD 02/13/17 [phone]

## 2017-02-13 NOTE — ED Notes (Signed)
Patient verbalizes understanding of d/c instructions and follow-up. VS stable and pain controlled per patient.  Patient in NAD at time of d/c and denies further concerns regarding this visit. Patient stable at the time of departure from the unit, departing unit by the safest and most appropriate manner per that patients condition and limitations. Patient advised to return to the ED at any time for emergent concerns, or for new/worsening symptoms.   PATIENT REFUSED [place]

## 2017-02-13 NOTE — ED Triage Notes (Addendum)
Pt ambulatory to triage with steady gait, no distress noted. Pt  C/o left sided intermittent chest pain x1 day. Pt reports nausea, palpitations, hypertension  and denies SOB. Pt is speaking rapidly and is anxious in triage. Pt has HX of anxiety but denies taking medication for such. Pt recently changed BP meds from Amlodipine 2.5mg  to Atenolol 50mg .

## 2017-02-19 DIAGNOSIS — I10 Essential (primary) hypertension: Secondary | ICD-10-CM | POA: Diagnosis not present

## 2017-02-19 DIAGNOSIS — R0602 Shortness of breath: Secondary | ICD-10-CM | POA: Diagnosis not present

## 2017-03-24 DIAGNOSIS — R0602 Shortness of breath: Secondary | ICD-10-CM | POA: Diagnosis not present

## 2017-03-25 ENCOUNTER — Ambulatory Visit: Payer: Self-pay | Admitting: Family Medicine

## 2017-03-26 ENCOUNTER — Ambulatory Visit: Payer: Self-pay | Admitting: Family Medicine

## 2017-03-26 ENCOUNTER — Encounter: Payer: Self-pay | Admitting: Family Medicine

## 2017-03-26 ENCOUNTER — Ambulatory Visit (INDEPENDENT_AMBULATORY_CARE_PROVIDER_SITE_OTHER): Payer: 59 | Admitting: Family Medicine

## 2017-03-26 VITALS — BP 162/94 | HR 80 | Temp 97.7°F | Resp 16 | Ht 64.0 in | Wt 189.0 lb

## 2017-03-26 DIAGNOSIS — N801 Endometriosis of ovary: Secondary | ICD-10-CM | POA: Diagnosis not present

## 2017-03-26 DIAGNOSIS — I10 Essential (primary) hypertension: Secondary | ICD-10-CM | POA: Diagnosis not present

## 2017-03-26 DIAGNOSIS — F419 Anxiety disorder, unspecified: Secondary | ICD-10-CM | POA: Insufficient documentation

## 2017-03-26 DIAGNOSIS — E669 Obesity, unspecified: Secondary | ICD-10-CM | POA: Diagnosis not present

## 2017-03-26 DIAGNOSIS — Z6832 Body mass index (BMI) 32.0-32.9, adult: Secondary | ICD-10-CM

## 2017-03-26 DIAGNOSIS — Z7689 Persons encountering health services in other specified circumstances: Secondary | ICD-10-CM

## 2017-03-26 DIAGNOSIS — F410 Panic disorder [episodic paroxysmal anxiety] without agoraphobia: Secondary | ICD-10-CM | POA: Insufficient documentation

## 2017-03-26 DIAGNOSIS — N80129 Deep endometriosis of ovary, unspecified ovary: Secondary | ICD-10-CM

## 2017-03-26 MED ORDER — ALPRAZOLAM 0.25 MG PO TABS: 0.2500 mg | ORAL_TABLET | Freq: Every evening | ORAL | 1 refills | Status: DC | PRN

## 2017-03-26 MED ORDER — AMLODIPINE BESYLATE 2.5 MG PO TABS: 2.5000 mg | ORAL_TABLET | Freq: Every day | ORAL | 3 refills | Status: DC

## 2017-03-26 NOTE — Assessment & Plan Note (Signed)
Infrequent, htough Benzo use has increased Discussed that this is not an everyday medication and that it is potentially addictive Will Rx Xanax with expectation that this Rx with 1 refill will last for close to 6 months Discussed possibility of SSRI - patient declines Recommended therapy F/u in 3 months

## 2017-03-26 NOTE — Assessment & Plan Note (Signed)
Discussed diet and exercise Recent Lipid panel wnl

## 2017-03-26 NOTE — Progress Notes (Signed)
Patient: Dana Livingston, Female    DOB: 1970-09-21, 46 y.o.   MRN: [id number] Visit Date: 03/26/2017  Today's Provider: Lavon Paganini, MD   Chief Complaint  Patient presents with  . New Patient (Initial Visit)   Subjective:    Establish care Dana Livingston is a 46 y.o. female who presents today for health maintenance and complete physical. She feels well. She reports exercising 6 days a week. Walks for 30 minutes and strength training. She reports she is sleeping well. She agrees to a tetanus vaccine, but declines a flu vaccine.  Last pap- 01/18/2016- Negative. HPV negative Refuses mammogram; states she prefers to wait until she is postmenopausal.  -----------------------------------------------------------------  Hypertension, follow-up:  BP Readings from Last 3 Encounters:  03/26/17 (!) 162/94  02/13/17 (!) 175/81  03/15/15 (!) 164/88    [facility] elevated BP. She was started on Amlodipine 2.5 mg po qd. She also tried Atenolol, but did not want to continue this medication because she was experiencing chest pain, palpitations, muscle cramps. This was later diagnosed as a panic attack, and not a reaction to the atenolol.  Previously diagnosed in 1s with HTN - took HCTZ, lost weight and no longer needed medications. She reports good compliance with treatment. She is not having side effects.  She is exercising. She is adherent to low salt diet.   Outside blood pressures are in the 130's/80's. She states she has white coat syndrome. She is experiencing none.  Patient denies chest pain, chest pressure/discomfort, claudication, dyspnea, exertional chest pressure/discomfort, fatigue, irregular heart beat, lower extremity edema, near-syncope, orthopnea, palpitations and syncope.   Cardiovascular risk factors include hypertension and obesity (BMI >= 30 kg/m2).  Use of agents associated with hypertension: none.     Weight trend: stable Wt Readings  from Last 3 Encounters:  03/26/17 189 lb (85.7 kg)  02/13/17 184 lb (83.5 kg)  03/15/15 184 lb 9.6 oz (83.7 kg)    Current diet: Vegan. She states she is working on losing weight. She was 196 lb in July. Does take B complex vitamin supplement ------------------------------------------------------------------------ Anxiety and panic attacks - was diagnosed many years ago - previously xanax 0.25 mg daily prn #30 would last for an entire year - thinks panic attacks were happening more frequently after gyn-onc surgery and health concerns around HTN - now back to taking xanax only 1-2 times weekly - hoping to wean back down - thinking of starting therapy - taking herbs for perimenopausal symptoms - thinks this affects anxiety  Elevated CA-125, s/p R ovarian removal for endometrioma - reviewed Gyn Onc notes - all pathology was negative  Review of Systems  Constitutional: Negative.   HENT: Negative.   Eyes: Negative.   Respiratory: Negative.   Cardiovascular: Negative.   Gastrointestinal: Negative.   Endocrine: Negative.   Genitourinary: Negative.   Musculoskeletal: Negative.   Skin: Negative.   Neurological: Negative.   Hematological: Negative.   Psychiatric/Behavioral: Negative for agitation, behavioral problems, confusion, decreased concentration, dysphoric mood, hallucinations, self-injury, sleep disturbance and suicidal ideas. The patient is nervous/anxious. The patient is not hyperactive.     Social History      She  reports that she has never smoked. She has never used smokeless tobacco. She reports that she does not drink alcohol or use drugs.       Social History   Social History  . Marital status: Married    Spouse name: Grafton Folk  . Number of  children: 0  . Years of education: masters x 2   Occupational History  .  Lab [employer]   Social History Main Topics  . Smoking status: Never Smoker  . Smokeless tobacco: Never Used  . Alcohol use No  . Drug use: No  .  Sexual activity: Yes    Birth control/ protection: None   Other Topics Concern  . None   Social History Narrative  . None    Past Medical History:  Diagnosis Date  . Anxiety   . Complication of anesthesia    for wisdom teeth extracted in office, could not get her under enough so had to do at hospital  . Hypertension   . 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 2001   normal pap smears since 2002     Patient Active Problem List   Diagnosis Date Noted  . Ovarian mass, right 01/18/2015  . Elevated cancer antigen 125 (CA-125) 01/18/2015  . Fibroids 01/18/2015    Past Surgical History:  Procedure Laterality Date  . CHOLECYSTECTOMY  1991  . LAPAROSCOPIC OVARIAN CYSTECTOMY Left 1998  . LASIK  2001  . ROBOTIC ASSISTED TOTAL HYSTERECTOMY WITH BILATERAL SALPINGO OOPHERECTOMY Bilateral 02/14/2015   Procedure: ROBOTIC ASSISTED RIGHT OOPHORECTOMY ;  Surgeon: [name], MD;  Location: [facility];  Service: Gynecology;  Laterality: Bilateral;  . TONSILLECTOMY  1981  . TONSILLECTOMY     age 47  . WISDOM TOOTH EXTRACTION      Family History        Family Status  Relation Status  . Mother Alive  . Father Alive  . MGM Deceased  . MGF Deceased  . Annamarie Major (Not Specified)        Her family history includes COPD in her father and maternal grandfather; Colon cancer in her paternal uncle; Diabetes in her father; Heart disease in her father; Hypertension in her father and mother; Lung cancer in her maternal grandmother; Thyroid disease in her mother.     Allergies  Allergen Reactions  . Ciprofloxacin Palpitations  . Tetracyclines & Related Nausea And Vomiting  . Other Other (See Comments)    Cats cause sneezing     Current Outpatient Prescriptions:  .  ALPRAZolam (XANAX) 0.25 MG tablet, Take 1 tablet (0.25 mg total) by mouth at bedtime as needed for anxiety., Disp: 10 tablet, Rfl: 0 .  amLODipine (NORVASC) 2.5 MG tablet, Take 2.5 mg by mouth daily.,  Disp: , Rfl: 0 .  Chaste Tree (VITEX EXTRACT PO), Take 2 capsules by mouth daily., Disp: , Rfl:  .  Cholecalciferol (VITAMIN D) 2000 units CAPS, Take 1 capsule by mouth daily., Disp: , Rfl:  .  Cyanocobalamin (B-12 SL), Place under the tongue., Disp: , Rfl:  .  IRON PO, Take by mouth., Disp: , Rfl:  .  Multiple Vitamin (MULTIVITAMIN) capsule, Take 1 capsule by mouth daily., Disp: , Rfl:  .  naproxen sodium (ANAPROX) 220 MG tablet, Take 220 mg by mouth 2 (two) times daily as needed (pain)., Disp: , Rfl:  .  Omega-3 Fatty Acids (OMEGA 3 PO), Take by mouth., Disp: , Rfl:  .  TURMERIC PO, Take 2 tablets by mouth daily., Disp: , Rfl:  .  UNABLE TO FIND, Med Name: Natural Mood Lift, Disp: , Rfl:    Patient Care Team: Virginia Crews, MD as PCP - General (Family Medicine)      Objective:   Vitals: BP (!) 162/94 (BP Location: Left Arm, Patient  Position: Sitting, Cuff Size: Large)   Pulse 80   Temp 97.7 F (36.5 C) (Oral)   Resp 16   Ht 5\' 4"  (1.626 m)   Wt 189 lb (85.7 kg)   LMP 03/12/2017   BMI 32.44 kg/m    Vitals:   03/26/17 1350  BP: (!) 162/94  Pulse: 80  Resp: 16  Temp: 97.7 F (36.5 C)  TempSrc: Oral  Weight: 189 lb (85.7 kg)  Height: 5\' 4"  (1.626 m)     Physical Exam  Constitutional: She is oriented to person, place, and time. She appears well-developed and well-nourished.  HENT:  Head: Normocephalic and atraumatic.  Right Ear: External ear normal.  Left Ear: External ear normal.  Nose: Nose normal.  Mouth/Throat: Oropharynx is clear and moist.  Eyes: Conjunctivae are normal. No scleral icterus.  Neck: Neck supple. No thyromegaly present.  Cardiovascular: Normal rate, regular rhythm, normal heart sounds and intact distal pulses.   No murmur heard. Pulmonary/Chest: Effort normal and breath sounds normal. No respiratory distress. She has no wheezes. She has no rales.  Abdominal: Soft. She exhibits no distension. There is no tenderness. There is no rebound and  no guarding.  Musculoskeletal: She exhibits no edema or deformity.  Lymphadenopathy:    She has no cervical adenopathy.  Neurological: She is alert and oriented to person, place, and time.  Skin: Skin is warm and dry. No rash noted.  Psychiatric: She has a normal mood and affect. Her behavior is normal.  Vitals reviewed.    Depression Screen PHQ 2/9 Scores 03/26/2017  PHQ - 2 Score 0    Assessment & Plan:     Routine Health Maintenance and Physical Exam  Exercise Activities and Dietary recommendations Goals    None       There is no immunization history on file for this patient.  Health Maintenance  Topic Date Due  . HIV Screening  06/29/1984  . TETANUS/TDAP  06/29/1988  . INFLUENZA VACCINE  02/12/2017  . PAP SMEAR  01/17/2018     Discussed health benefits of physical activity, and encouraged her to engage in regular exercise appropriate for her age and condition.  Patient initially agrees to TDAP, but then wants to think about it for longer   -------------------------------------------------------------------- Problem List Items Addressed This Visit      Cardiovascular and Mediastinum   Hypertension    Uncontrolled today States this is white coat HTN Well controlled at home Continue low dose amlodipine Reviewed recent BMP - wnl      Relevant Medications   amLODipine (NORVASC) 2.5 MG tablet     Genitourinary   Endometrioma of ovary    Managed by Gyn Onc, s/p surgical removal of R ovary        Other   Anxiety    See plan above for panic attacks      Relevant Medications   ALPRAZolam (XANAX) 0.25 MG tablet   Panic attacks    Infrequent, htough Benzo use has increased Discussed that this is not an everyday medication and that it is potentially addictive Will Rx Xanax with expectation that this Rx with 1 refill will last for close to 6 months Discussed possibility of SSRI - patient declines Recommended therapy F/u in 3 months      Relevant  Medications   ALPRAZolam (XANAX) 0.25 MG tablet   Obesity    Discussed diet and exercise Recent Lipid panel wnl       Other Visit Diagnoses  Encounter to establish care    -  Primary      Return in about 3 months (around 06/25/2017) for BP f/u.  The entirety of the information documented in the History of Present Illness, Review of Systems and Physical Exam were personally obtained by me. Portions of this information were initially documented by Raquel Sarna Ratchford, CMA and reviewed by me for thoroughness and accuracy.    Lavon Paganini, MD  [facility]

## 2017-03-26 NOTE — Patient Instructions (Signed)

## 2017-03-26 NOTE — Assessment & Plan Note (Addendum)
Uncontrolled today States this is white coat HTN Well controlled at home Continue low dose amlodipine Reviewed recent BMP - wnl

## 2017-03-26 NOTE — Assessment & Plan Note (Signed)
Managed by Andria Frames, s/p surgical removal of R ovary

## 2017-03-26 NOTE — Assessment & Plan Note (Signed)
See plan above for panic attacks

## 2017-03-27 ENCOUNTER — Encounter: Payer: Self-pay | Admitting: Family Medicine

## 2017-03-31 DIAGNOSIS — R0602 Shortness of breath: Secondary | ICD-10-CM | POA: Diagnosis not present

## 2017-03-31 DIAGNOSIS — I10 Essential (primary) hypertension: Secondary | ICD-10-CM | POA: Diagnosis not present

## 2017-05-26 ENCOUNTER — Other Ambulatory Visit: Payer: Self-pay | Admitting: Family Medicine

## 2017-05-26 MED ORDER — AMLODIPINE BESYLATE 2.5 MG PO TABS: 2.5000 mg | ORAL_TABLET | Freq: Every day | ORAL | 3 refills | Status: DC

## 2017-05-26 NOTE — Telephone Encounter (Signed)
LOV 03/26/2017. Has HTN FU 06/25/2017.

## 2017-05-26 NOTE — Telephone Encounter (Signed)
[facility]ed [facility] request for a 90-days supply for the following medication. Thanks CC  amLODipine (NORVASC) 2.5 MG tablet

## 2017-06-25 ENCOUNTER — Ambulatory Visit: Payer: 59 | Admitting: Family Medicine

## 2017-06-25 ENCOUNTER — Encounter: Payer: Self-pay | Admitting: Family Medicine

## 2017-06-25 VITALS — BP 150/90 | HR 68 | Temp 98.2°F | Resp 16 | Wt 191.0 lb

## 2017-06-25 DIAGNOSIS — F419 Anxiety disorder, unspecified: Secondary | ICD-10-CM | POA: Diagnosis not present

## 2017-06-25 DIAGNOSIS — E669 Obesity, unspecified: Secondary | ICD-10-CM

## 2017-06-25 DIAGNOSIS — I10 Essential (primary) hypertension: Secondary | ICD-10-CM | POA: Diagnosis not present

## 2017-06-25 DIAGNOSIS — Z6832 Body mass index (BMI) 32.0-32.9, adult: Secondary | ICD-10-CM | POA: Diagnosis not present

## 2017-06-25 NOTE — Assessment & Plan Note (Signed)
No further panic attacks Not needing Xanax at this time Likely contributing to HTN, though

## 2017-06-25 NOTE — Assessment & Plan Note (Signed)
Long discussion regarding diet and exercise Planning to join a gym soon

## 2017-06-25 NOTE — Progress Notes (Signed)
Patient: Dana Livingston Female    DOB: Dec 23, 1970   46 y.o.   MRN: [id number] Visit Date: 06/25/2017  Today's Provider: Lavon Paganini, MD   Chief Complaint  Patient presents with  . Hypertension   Subjective:    HPI  Hypertension, follow-up, Obesity:  BP Readings from Last 3 Encounters:  06/25/17 (!) 150/90  03/26/17 (!) 162/94  02/13/17 (!) 175/81    She was last seen for hypertension 3 months ago.  BP at that visit was 162/94, as above. Management changes since that visit include no med changes,  Walking 30 min daily 5 times weekly at work. She reports excellent compliance with treatment. She is not having side effects.  She is exercising. She is adherent to low salt diet.   Outside blood pressures are stable pt reports 130/80's.  States that she is anxious about coming to doctor's office States that BP monitor wascalibrated when she first got it and feels like it is accurate She is experiencing none.  Patient denies chest pain, SOB, LE edema, headaches  Cardiovascular risk factors include hypertension and obesity (BMI >= 30 kg/m2).  As well as family history Use of agents associated with hypertension: none.     Weight trend: stable Wt Readings from Last 3 Encounters:  06/25/17 191 lb (86.6 kg)  03/26/17 189 lb (85.7 kg)  02/13/17 184 lb (83.5 kg)    Current diet: in general, a "healthy" diet    ------------------------------------------------------------------------ Anxiety: not currently taking any meds.  No further panic attacks. Remains skeptical of all pharmaceuticals.       Allergies  Allergen Reactions  . Ciprofloxacin Palpitations  . Tetracyclines & Related Nausea And Vomiting  . Other Other (See Comments)    Cats cause sneezing     Current Outpatient Medications:  .  ALPRAZolam (XANAX) 0.25 MG tablet, Take 1 tablet (0.25 mg total) by mouth at bedtime as needed for anxiety., Disp: 30 tablet, Rfl: 1 .  amLODipine (NORVASC) 2.5 MG  tablet, Take 1 tablet (2.5 mg total) daily by mouth., Disp: 90 tablet, Rfl: 3 .  BIOTIN PO, Take by mouth., Disp: , Rfl:  .  Chaste Tree (VITEX EXTRACT PO), Take 2 capsules by mouth daily., Disp: , Rfl:  .  Cholecalciferol (VITAMIN D) 2000 units CAPS, Take 1 capsule by mouth daily., Disp: , Rfl:  .  Cyanocobalamin (B-12 SL), Place under the tongue., Disp: , Rfl:  .  IRON PO, Take by mouth., Disp: , Rfl:  .  Multiple Vitamin (MULTIVITAMIN) capsule, Take 1 capsule by mouth daily., Disp: , Rfl:  .  PASSION FLOWER-VALERIAN PO, Take by mouth., Disp: , Rfl:  .  TURMERIC PO, Take 2 tablets by mouth daily., Disp: , Rfl:   Review of Systems  Constitutional: Negative.   Respiratory: Negative.  Negative for cough and shortness of breath.   Cardiovascular: Negative.   Endocrine: Negative.   Genitourinary: Negative.   Neurological: Negative.   Hematological: Negative.   Psychiatric/Behavioral: Negative.     Social History   Tobacco Use  . Smoking status: Never Smoker  . Smokeless tobacco: Never Used  Substance Use Topics  . Alcohol use: No   Objective:   BP (!) 150/90 (BP Location: Left Arm, Patient Position: Sitting, Cuff Size: Large)   Pulse 68   Temp 98.2 F (36.8 C) (Oral)   Resp 16   Wt 191 lb (86.6 kg)   LMP 06/21/2017 (Exact Date)   SpO2 98%   BMI 32.79  kg/m  Vitals:   06/25/17 1625  BP: (!) 150/90  Pulse: 68  Resp: 16  Temp: 98.2 F (36.8 C)  TempSrc: Oral  SpO2: 98%  Weight: 191 lb (86.6 kg)     Physical Exam  Constitutional: She is oriented to person, place, and time. She appears well-developed and well-nourished. No distress.  HENT:  Head: Normocephalic and atraumatic.  Eyes: Conjunctivae are normal. No scleral icterus.  Cardiovascular: Normal rate and regular rhythm.  Pulmonary/Chest: Effort normal. No respiratory distress.  Musculoskeletal: She exhibits no edema.  Neurological: She is alert and oriented to person, place, and time.  Skin: Skin is warm and  dry.  Psychiatric:  Anxious. Appropriate groom and dress.  Rambling speech, not able to interrupt  Vitals reviewed.       Assessment & Plan:      Problem List Items Addressed This Visit      Cardiovascular and Mediastinum   Hypertension - Primary    Uncontrolled today Reports well controlled on home reading Likely related to anxiety and white coat syndrome Continue amlodipine 2.5mg  daily F/u in 6 weeks        Other   Anxiety    No further panic attacks Not needing Xanax at this time Likely contributing to HTN, though      Obesity    Long discussion regarding diet and exercise Planning to join a gym soon         Return in about 6 months (around 12/24/2017) for BP f/u.     The entirety of the information documented in the History of Present Illness, Review of Systems and Physical Exam were personally obtained by me. Portions of this information were initially documented by Lynford Humphrey, CMA and reviewed by me for thoroughness and accuracy.    Virginia Crews, MD, MPH [facility] 06/25/2017 5:10 PM

## 2017-06-25 NOTE — Patient Instructions (Signed)

## 2017-06-25 NOTE — Assessment & Plan Note (Signed)
Uncontrolled today Reports well controlled on home reading Likely related to anxiety and white coat syndrome Continue amlodipine 2.5mg  daily F/u in 6 weeks

## 2017-11-24 ENCOUNTER — Ambulatory Visit: Payer: 59 | Admitting: Family Medicine

## 2017-12-01 ENCOUNTER — Ambulatory Visit (INDEPENDENT_AMBULATORY_CARE_PROVIDER_SITE_OTHER): Payer: 59 | Admitting: Family Medicine

## 2017-12-01 ENCOUNTER — Encounter: Payer: Self-pay | Admitting: Family Medicine

## 2017-12-01 VITALS — BP 176/106 | HR 69 | Temp 98.3°F | Resp 16 | Wt 190.0 lb

## 2017-12-01 DIAGNOSIS — F419 Anxiety disorder, unspecified: Secondary | ICD-10-CM | POA: Diagnosis not present

## 2017-12-01 DIAGNOSIS — F410 Panic disorder [episodic paroxysmal anxiety] without agoraphobia: Secondary | ICD-10-CM

## 2017-12-01 DIAGNOSIS — I10 Essential (primary) hypertension: Secondary | ICD-10-CM

## 2017-12-01 MED ORDER — ALPRAZOLAM 0.25 MG PO TABS: 0.2500 mg | ORAL_TABLET | Freq: Every evening | ORAL | 1 refills | Status: DC | PRN

## 2017-12-01 MED ORDER — AMLODIPINE BESYLATE 5 MG PO TABS: 5.0000 mg | ORAL_TABLET | Freq: Every day | ORAL | 5 refills | Status: DC

## 2017-12-01 NOTE — Assessment & Plan Note (Signed)
Uncontrolled today Know that some of this is white coat hypertension It is also elevated at home, however as well Discussed diet and exercise Increase amlodipine to 5mg  daily Reviewed recent labs

## 2017-12-01 NOTE — Progress Notes (Signed)
Patient: Dana Livingston Female    DOB: May 08, 1971   47 y.o.   MRN: [id number] Visit Date: 12/01/2017  Today's Provider: Lavon Paganini, MD   I, Martha Clan, CMA, am acting as scribe for Lavon Paganini, MD.  Chief Complaint  Patient presents with  . Hypertension   Subjective:    HPI      Hypertension, follow-up:  BP Readings from Last 3 Encounters:  12/01/17 (!) 176/106  06/25/17 (!) 150/90  03/26/17 (!) 162/94    She was last seen for hypertension 6 months ago.  BP at that visit was 150/90. Management since that visit includes none, as this has been controlled at home and provider believed this was secondary to anxiety/ white coat syndrome. She reports good compliance with treatment. She is not having side effects.  She is not exercising. She joined a gym, and is planning to start going now that the weather is hot. She is adherent to low salt diet.   Outside blood pressures are 140's/90's. She is experiencing none.  Patient denies chest pain, chest pressure/discomfort, claudication, dyspnea, exertional chest pressure/discomfort, fatigue, irregular heart beat, lower extremity edema, near-syncope, orthopnea, palpitations and syncope.   Cardiovascular risk factors include family history of premature cardiovascular disease and hypertension.  Use of agents associated with hypertension: none.     Weight trend: stable Wt Readings from Last 3 Encounters:  12/01/17 190 lb (86.2 kg)  06/25/17 191 lb (86.6 kg)  03/26/17 189 lb (85.7 kg)    Current diet: vegan and low sodium  ------------------------------------------------------------------------   Allergies  Allergen Reactions  . Ciprofloxacin Palpitations  . Tetracyclines & Related Nausea And Vomiting  . Other Other (See Comments)    Cats cause sneezing     Current Outpatient Medications:  .  ALPRAZolam (XANAX) 0.25 MG tablet, Take 1 tablet (0.25 mg total) by mouth at bedtime as needed for  anxiety., Disp: 30 tablet, Rfl: 1 .  amLODipine (NORVASC) 5 MG tablet, Take 1 tablet (5 mg total) by mouth daily., Disp: 30 tablet, Rfl: 5 .  BIOTIN PO, Take by mouth., Disp: , Rfl:  .  Chaste Tree (VITEX EXTRACT PO), Take 2 capsules by mouth daily., Disp: , Rfl:  .  Cholecalciferol (VITAMIN D) 2000 units CAPS, Take 1 capsule by mouth daily., Disp: , Rfl:  .  Cyanocobalamin (B-12 SL), Place under the tongue., Disp: , Rfl:  .  IRON PO, Take by mouth., Disp: , Rfl:  .  Multiple Vitamin (MULTIVITAMIN) capsule, Take 1 capsule by mouth daily., Disp: , Rfl:  .  PASSION FLOWER-VALERIAN PO, Take by mouth., Disp: , Rfl:  .  TURMERIC PO, Take 2 tablets by mouth daily., Disp: , Rfl:   Review of Systems  Constitutional: Negative for activity change, appetite change, chills, diaphoresis, fatigue, fever and unexpected weight change.  Respiratory: Negative for shortness of breath.   Cardiovascular: Negative for chest pain, palpitations and leg swelling.  Psychiatric/Behavioral: The patient is nervous/anxious.     Social History   Tobacco Use  . Smoking status: Never Smoker  . Smokeless tobacco: Never Used  Substance Use Topics  . Alcohol use: No   Objective:   BP (!) 176/106 (BP Location: Left Arm, Patient Position: Sitting, Cuff Size: Large)   Pulse 69   Temp 98.3 F (36.8 C) (Oral)   Resp 16   Wt 190 lb (86.2 kg)   LMP 10/20/2017   SpO2 99%   BMI 32.61 kg/m  Vitals:  12/01/17 1550  BP: (!) 176/106  Pulse: 69  Resp: 16  Temp: 98.3 F (36.8 C)  TempSrc: Oral  SpO2: 99%  Weight: 190 lb (86.2 kg)     Physical Exam  Constitutional: She is oriented to person, place, and time. She appears well-developed and well-nourished. No distress.  HENT:  Head: Normocephalic and atraumatic.  Mouth/Throat: No oropharyngeal exudate.  Eyes: Conjunctivae are normal. No scleral icterus.  Cardiovascular: Normal rate, regular rhythm, normal heart sounds and intact distal pulses.  No murmur  heard. Pulmonary/Chest: Effort normal and breath sounds normal. No respiratory distress. She has no wheezes.  Musculoskeletal: She exhibits no edema.  Neurological: She is alert and oriented to person, place, and time.  Skin: Skin is warm and dry. Capillary refill takes less than 2 seconds.  Psychiatric: Her speech is normal and behavior is normal. Her mood appears anxious. Cognition and memory are normal.  Vitals reviewed.       Assessment & Plan:   Problem List Items Addressed This Visit      Cardiovascular and Mediastinum   Hypertension - Primary    Uncontrolled today Know that some of this is white coat hypertension It is also elevated at home, however as well Discussed diet and exercise Increase amlodipine to 5mg  daily Reviewed recent labs      Relevant Medications   amLODipine (NORVASC) 5 MG tablet     Other   Anxiety   Relevant Medications   ALPRAZolam (XANAX) 0.25 MG tablet   Panic attacks   Relevant Medications   ALPRAZolam (XANAX) 0.25 MG tablet       Return in about 3 months (around 03/03/2018) for CPE.   The entirety of the information documented in the History of Present Illness, Review of Systems and Physical Exam were personally obtained by me. Portions of this information were initially documented by Raquel Sarna Ratchford, CMA and reviewed by me for thoroughness and accuracy.    Virginia Crews, MD, MPH [facility] 12/01/2017 4:44 PM

## 2017-12-01 NOTE — Patient Instructions (Signed)
[email or web address]   Hypertension Hypertension, commonly called high blood pressure, is when the force of blood pumping through the arteries is too strong. The arteries are the blood vessels that carry blood from the heart throughout the body. Hypertension forces the heart to work harder to pump blood and may cause arteries to become narrow or stiff. Having untreated or uncontrolled hypertension can cause heart attacks, strokes, kidney disease, and other problems. A blood pressure reading consists of a higher number over a lower number. Ideally, your blood pressure should be below 120/80. The first ("top") number is called the systolic pressure. It is a measure of the pressure in your arteries as your heart beats. The second ("bottom") number is called the diastolic pressure. It is a measure of the pressure in your arteries as the heart relaxes. What are the causes? The cause of this condition is not known. What increases the risk? Some risk factors for high blood pressure are under your control. Others are not. Factors you can change  Smoking.  Having type 2 diabetes mellitus, high cholesterol, or both.  Not getting enough exercise or physical activity.  Being overweight.  Having too much fat, sugar, calories, or salt (sodium) in your diet.  Drinking too much alcohol. Factors that are difficult or impossible to change  Having chronic kidney disease.  Having a family history of high blood pressure.  Age. Risk increases with age.  Race. You may be at higher risk if you are African-American.  Gender. Men are at higher risk than women before age 45. After age 20, women are at higher risk than men.  Having obstructive sleep apnea.  Stress. What are the signs or symptoms? Extremely high blood pressure (hypertensive crisis) may cause:  Headache.  Anxiety.  Shortness of breath.  Nosebleed.  Nausea and  vomiting.  Severe chest pain.  Jerky movements you cannot control (seizures).  How is this diagnosed? This condition is diagnosed by measuring your blood pressure while you are seated, with your arm resting on a surface. The cuff of the blood pressure monitor will be placed directly against the skin of your upper arm at the level of your heart. It should be measured at least twice using the same arm. Certain conditions can cause a difference in blood pressure between your right and left arms. Certain factors can cause blood pressure readings to be lower or higher than normal (elevated) for a short period of time:  When your blood pressure is higher when you are in a health care provider's office than when you are at home, this is called white coat hypertension. Most people with this condition do not need medicines.  When your blood pressure is higher at home than when you are in a health care provider's office, this is called masked hypertension. Most people with this condition may need medicines to control blood pressure.  If you have a high blood pressure reading during one visit or you have normal blood pressure with other risk factors:  You may be asked to return on a different day to have your blood pressure checked again.  You may be asked to monitor your blood pressure at home for 1 week or longer.  If you are diagnosed with hypertension, you may have other blood or imaging tests to help your health care provider understand your overall risk for other conditions. How is this treated? This condition is treated by making healthy lifestyle changes, such as eating healthy foods, exercising more, and reducing your  alcohol intake. Your health care provider may prescribe medicine if lifestyle changes are not enough to get your blood pressure under control, and if:  Your systolic blood pressure is above 130.  Your diastolic blood pressure is above 80.  Your personal target blood pressure  may vary depending on your medical conditions, your age, and other factors. Follow these instructions at home: Eating and drinking  Eat a diet that is high in fiber and potassium, and low in sodium, added sugar, and fat. An example eating plan is called the DASH (Dietary Approaches to Stop Hypertension) diet. To eat this way: ? Eat plenty of fresh fruits and vegetables. Try to fill half of your plate at each meal with fruits and vegetables. ? Eat whole grains, such as whole wheat pasta, brown rice, or whole grain bread. Fill about one quarter of your plate with whole grains. ? Eat or drink low-fat dairy products, such as skim milk or low-fat yogurt. ? Avoid fatty cuts of meat, processed or cured meats, and poultry with skin. Fill about one quarter of your plate with lean proteins, such as fish, chicken without skin, beans, eggs, and tofu. ? Avoid premade and processed foods. These tend to be higher in sodium, added sugar, and fat.  Reduce your daily sodium intake. Most people with hypertension should eat less than 1,500 mg of sodium a day.  Limit alcohol intake to no more than 1 drink a day for nonpregnant women and 2 drinks a day for men. One drink equals 12 oz of beer, 5 oz of wine, or 1 oz of hard liquor. Lifestyle  Work with your health care provider to maintain a healthy body weight or to lose weight. Ask what an ideal weight is for you.  Get at least 30 minutes of exercise that causes your heart to beat faster (aerobic exercise) most days of the week. Activities may include walking, swimming, or biking.  Include exercise to strengthen your muscles (resistance exercise), such as pilates or lifting weights, as part of your weekly exercise routine. Try to do these types of exercises for 30 minutes at least 3 days a week.  Do not use any products that contain nicotine or tobacco, such as cigarettes and e-cigarettes. If you need help quitting, ask your health care provider.  Monitor your  blood pressure at home as told by your health care provider.  Keep all follow-up visits as told by your health care provider. This is important. Medicines  Take over-the-counter and prescription medicines only as told by your health care provider. Follow directions carefully. Blood pressure medicines must be taken as prescribed.  Do not skip doses of blood pressure medicine. Doing this puts you at risk for problems and can make the medicine less effective.  Ask your health care provider about side effects or reactions to medicines that you should watch for. Contact a health care provider if:  You think you are having a reaction to a medicine you are taking.  You have headaches that keep coming back (recurring).  You feel dizzy.  You have swelling in your ankles.  You have trouble with your vision. Get help right away if:  You develop a severe headache or confusion.  You have unusual weakness or numbness.  You feel faint.  You have severe pain in your chest or abdomen.  You vomit repeatedly.  You have trouble breathing. Summary  Hypertension is when the force of blood pumping through your arteries is too strong. If this  condition is not controlled, it may put you at risk for serious complications.  Your personal target blood pressure may vary depending on your medical conditions, your age, and other factors. For most people, a normal blood pressure is less than 120/80.  Hypertension is treated with lifestyle changes, medicines, or a combination of both. Lifestyle changes include weight loss, eating a healthy, low-sodium diet, exercising more, and limiting alcohol. This information is not intended to replace advice given to you by your health care provider. Make sure you discuss any questions you have with your health care provider. Document Released: 07/01/2005 Document Revised: 05/29/2016 Document Reviewed: 05/29/2016 Elsevier Interactive Patient Education  [employer].

## 2017-12-09 ENCOUNTER — Telehealth: Payer: Self-pay | Admitting: Family Medicine

## 2017-12-09 NOTE — Telephone Encounter (Signed)
Pt would like a nurse to call her back regarding her blood pressure and taking the amlodipine 5 mg.  She is still having some some elevated blood pressure while taking the rx.  Pt's call back is [phone]  Thanks teri

## 2017-12-10 NOTE — Telephone Encounter (Signed)
Pt states her BP was reading 130's/85 in the beginning of the week, but now her BP is increasing. Currebt readings are 140-155/88-98. States she was eating more sodium when it was elevated, and it "was hot that day". Denies chest pain, SOB. Please advise.

## 2017-12-11 NOTE — Telephone Encounter (Signed)
Pt advised, and agrees with plan.

## 2017-12-11 NOTE — Telephone Encounter (Signed)
Work on eating low sodium diet and continue to monitor.  In another 2 weeks if BP is still >140/90 consistently, we can consider increasing amlodipine dose again  Virginia Crews, MD, MPH [facility] 12/11/2017 9:39 AM

## 2017-12-17 DIAGNOSIS — H04123 Dry eye syndrome of bilateral lacrimal glands: Secondary | ICD-10-CM | POA: Diagnosis not present

## 2018-02-17 LAB — BASIC METABOLIC PANEL
Creatinine: 0.8 (ref 0.5–1.1)
Glucose: 94

## 2018-02-17 LAB — LIPID PANEL
CHOLESTEROL: 175 (ref 0–200)
HDL: 41 (ref 35–70)
LDL CALC: 101
TRIGLYCERIDES: 166 — AB (ref 40–160)

## 2018-02-17 LAB — HEMOGLOBIN A1C: HEMOGLOBIN A1C: 5.5

## 2018-03-04 ENCOUNTER — Encounter: Payer: Self-pay | Admitting: Family Medicine

## 2018-03-04 ENCOUNTER — Ambulatory Visit (INDEPENDENT_AMBULATORY_CARE_PROVIDER_SITE_OTHER): Payer: 59 | Admitting: Family Medicine

## 2018-03-04 VITALS — BP 156/100 | HR 84 | Temp 98.4°F | Resp 16 | Ht 64.0 in | Wt 195.0 lb

## 2018-03-04 DIAGNOSIS — I10 Essential (primary) hypertension: Secondary | ICD-10-CM

## 2018-03-04 DIAGNOSIS — Z6833 Body mass index (BMI) 33.0-33.9, adult: Secondary | ICD-10-CM

## 2018-03-04 DIAGNOSIS — Z0000 Encounter for general adult medical examination without abnormal findings: Secondary | ICD-10-CM | POA: Diagnosis not present

## 2018-03-04 DIAGNOSIS — E669 Obesity, unspecified: Secondary | ICD-10-CM

## 2018-03-04 DIAGNOSIS — Z23 Encounter for immunization: Secondary | ICD-10-CM

## 2018-03-04 NOTE — Addendum Note (Signed)
Addended by: Brennan Bailey on: 03/04/2018 04:48 PM   Modules accepted: Orders

## 2018-03-04 NOTE — Progress Notes (Signed)
Patient: Dana Livingston, Female    DOB: [birth year], 47 y.o.   MRN: [id number] Visit Date: 03/04/2018  Today's Provider: Lavon Paganini, MD   I, Martha Clan, CMA, am acting as scribe for Lavon Paganini, MD.  Chief Complaint  Patient presents with  . Annual Exam  . Hypertension   Subjective:    Annual physical exam Dana Livingston is a 47 y.o. female who presents today for health maintenance and complete physical. She feels fairly well. She is c/o menopausal sx. She reports exercising none. She reports she is sleeping well.  Last pap- 01/28/2015- NIL; HPV negative (of note history is wrong and lists total hysterectomy but she only had R oophorectomy for endometrioma) Last mammogram- never, and declines this until she is postmenopausal. (willing to start at age 77) -----------------------------------------------------------------   Hypertension, follow-up:  BP Readings from Last 3 Encounters:  03/04/18 (!) 156/100  12/01/17 (!) 176/106  06/25/17 (!) 150/90    She was last seen for hypertension 3 months ago.  BP at that visit was 176/106. Management since that visit includes increasing amlodipine to 5 mg. She reports good compliance with treatment. She is not having side effects.  She is not exercising. She is adherent to low salt diet.  Only eating 500-700 mg sodium daily. Outside blood pressures are 120's-130's/80's. She is experiencing none.  Patient denies chest pain, chest pressure/discomfort, claudication, dyspnea, exertional chest pressure/discomfort, fatigue, irregular heart beat, lower extremity edema, near-syncope, orthopnea, palpitations and syncope.   Cardiovascular risk factors include hypertension.  Use of agents associated with hypertension: none.     Weight trend: stable Wt Readings from Last 3 Encounters:  03/04/18 195 lb (88.5 kg)  12/01/17 190 lb (86.2 kg)  06/25/17 191 lb (86.6 kg)    Current diet: high fiber, low fat/  cholesterol, low salt, vegan. Avoids processed foods.  ------------------------------------------------------------------------   Review of Systems  Constitutional: Negative.   HENT: Negative.   Eyes: Negative.   Respiratory: Negative.   Cardiovascular: Negative.   Gastrointestinal: Negative.   Endocrine: Negative.   Genitourinary: Negative.   Musculoskeletal: Negative.   Skin: Negative.   Allergic/Immunologic: Negative.   Neurological: Negative.   Hematological: Negative.   Psychiatric/Behavioral: Negative.     Social History      She  reports that she has never smoked. She has never used smokeless tobacco. She reports that she does not drink alcohol or use drugs.       Social History   Socioeconomic History  . Marital status: Married    Spouse name: Grafton Folk  . Number of children: 0  . Years of education: masters x 2  . Highest education level: Not on file  Occupational History    Employer: [employer]  . Financial resource strain: Not on file  . Food insecurity:    Worry: Not on file    Inability: Not on file  . Transportation needs:    Medical: Not on file    Non-medical: Not on file  Tobacco Use  . Smoking status: Never Smoker  . Smokeless tobacco: Never Used  Substance and Sexual Activity  . Alcohol use: No  . Drug use: No  . Sexual activity: Yes    Birth control/protection: None  Lifestyle  . Physical activity:    Days per week: Not on file    Minutes per session: Not on file  . Stress: Not on file  Relationships  . Social connections:  Talks on phone: Not on file    Gets together: Not on file    Attends religious service: Not on file    Active member of club or organization: Not on file    Attends meetings of clubs or organizations: Not on file    Relationship status: Not on file  Other Topics Concern  . Not on file  Social History Narrative  . Not on file    Past Medical History:  Diagnosis Date  . Anxiety   .  Complication of anesthesia 1989   for wisdom teeth extracted in office, could not get her under enough so had to do at hospital  . Fibroids   . Hypertension   . 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 2001   normal pap smears since 2002  . Panic attacks      Patient Active Problem List   Diagnosis Date Noted  . Obesity 03/26/2017  . Hypertension   . Anxiety   . Panic attacks   . 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 Elevated cancer antigen 125 (CA-125) 01/18/2015  . Fibroids 01/18/2015    Past Surgical History:  Procedure Laterality Date  . CHOLECYSTECTOMY  1991  . LAPAROSCOPIC OVARIAN CYSTECTOMY Left 1998  . LASIK  2001  . ROBOTIC ASSISTED TOTAL HYSTERECTOMY WITH BILATERAL SALPINGO OOPHERECTOMY Bilateral 02/14/2015   Procedure: ROBOTIC ASSISTED RIGHT OOPHORECTOMY ;  Surgeon: [name], MD;  Location: [facility];  Service: Gynecology;  Laterality: Bilateral;  . TONSILLECTOMY  1981  . WISDOM TOOTH EXTRACTION      Family History        Family Status  Relation Name Status  . Mother  Alive  . Father  Alive  . MGM  Deceased  . MGF  Deceased  . Annamarie Major  (Not Specified)  . Neg Hx  (Not Specified)        Her family history includes COPD in her father and maternal grandfather; Colon cancer in her paternal uncle; Diabetes in her father; Heart failure in her father; Hypertension in her father and mother; Lung cancer in her maternal grandmother; Thyroid disease in her mother. There is no history of Breast cancer, Ovarian cancer, or Cervical cancer.      Allergies  Allergen Reactions  . Ciprofloxacin Palpitations  . Tetracyclines & Related Nausea And Vomiting  . Other Other (See Comments)    Cats cause sneezing     Current Outpatient Medications:  .  ALPRAZolam (XANAX) 0.25 MG tablet, Take 1 tablet (0.25 mg total) by mouth at bedtime as needed for anxiety., Disp: 30 tablet, Rfl: 1 .  amLODipine (NORVASC) 5 MG tablet, Take 1 tablet (5 mg total)  by mouth daily., Disp: 30 tablet, Rfl: 5 .  Chaste Tree (VITEX EXTRACT PO), Take 2 capsules by mouth daily., Disp: , Rfl:  .  Cholecalciferol (VITAMIN D) 2000 units CAPS, Take 1 capsule by mouth daily., Disp: , Rfl:  .  Cyanocobalamin (B-12 SL), Place under the tongue., Disp: , Rfl:  .  IRON PO, Take by mouth., Disp: , Rfl:  .  Multiple Vitamin (MULTIVITAMIN) capsule, Take 1 capsule by mouth daily., Disp: , Rfl:  .  PASSION FLOWER-VALERIAN PO, Take by mouth., Disp: , Rfl:  .  TURMERIC PO, Take 2 tablets by mouth daily., Disp: , Rfl:    Patient Care Team: Virginia Crews, MD as PCP - General (Family Medicine)      Objective:   Vitals: BP (!) 156/100 (BP  Location: Left Arm, Patient Position: Sitting, Cuff Size: Large)   Pulse 84   Temp 98.4 F (36.9 C) (Oral)   Resp 16   Ht 5\' 4"  (1.626 m)   Wt 195 lb (88.5 kg)   SpO2 99%   BMI 33.47 kg/m    Vitals:   03/04/18 1536  BP: (!) 156/100  Pulse: 84  Resp: 16  Temp: 98.4 F (36.9 C)  TempSrc: Oral  SpO2: 99%  Weight: 195 lb (88.5 kg)  Height: 5\' 4"  (1.626 m)     Physical Exam  Constitutional: She is oriented to person, place, and time. She appears well-developed and well-nourished. No distress.  HENT:  Head: Normocephalic and atraumatic.  Right Ear: External ear normal.  Left Ear: External ear normal.  Nose: Nose normal.  Mouth/Throat: Oropharynx is clear and moist.  Eyes: Pupils are equal, round, and reactive to light. Conjunctivae and EOM are normal. No scleral icterus.  Neck: Neck supple. No thyromegaly present.  Cardiovascular: Normal rate, regular rhythm, normal heart sounds and intact distal pulses.  No murmur heard. Pulmonary/Chest: Effort normal and breath sounds normal. No respiratory distress. She has no wheezes. She has no rales.  Abdominal: Soft. Bowel sounds are normal. She exhibits no distension. There is no tenderness. There is no rebound and no guarding.  Genitourinary:  Genitourinary Comments:  Breasts: breasts appear normal, no suspicious masses, no skin or nipple changes or axillary nodes.   Musculoskeletal: She exhibits no edema or deformity.  Lymphadenopathy:    She has no cervical adenopathy.  Neurological: She is alert and oriented to person, place, and time.  Skin: Skin is warm and dry. Capillary refill takes less than 2 seconds. No rash noted.  Psychiatric: She has a normal mood and affect. Her behavior is normal.  Vitals reviewed.    Depression Screen PHQ 2/9 Scores 03/04/2018 03/26/2017  PHQ - 2 Score 0 0     Assessment & Plan:     Routine Health Maintenance and Physical Exam  Exercise Activities and Dietary recommendations Goals   None      There is no immunization history on file for this patient.  Health Maintenance  Topic Date Due  . HIV Screening  06/29/1984  . TETANUS/TDAP  06/29/1988  . INFLUENZA VACCINE  02/12/2018  . PAP SMEAR  01/18/2020     Discussed health benefits of physical activity, and encouraged her to engage in regular exercise appropriate for her age and condition.    --------------------------------------------------------------------  Problem List Items Addressed This Visit      Cardiovascular and Mediastinum   Hypertension    Uncontrolled Has white coat hypertension Home BPs well controlled  Eating low sodium diet Continue amlodipine at current dose Reviewed recent labs (abstracted)        Other   Obesity    Long discussion regarding diet and exercise       Other Visit Diagnoses    Encounter for annual physical exam    -  Primary       Return in about 6 months (around 09/04/2018) for BP f/u.   The entirety of the information documented in the History of Present Illness, Review of Systems and Physical Exam were personally obtained by me. Portions of this information were initially documented by Raquel Sarna Ratchford, CMA and reviewed by me for thoroughness and accuracy.    Virginia Crews, MD,  [facility] 03/04/2018 4:28 PM

## 2018-03-04 NOTE — Assessment & Plan Note (Signed)
Uncontrolled Has white coat hypertension Home BPs well controlled  Eating low sodium diet Continue amlodipine at current dose Reviewed recent labs (abstracted)

## 2018-03-04 NOTE — Patient Instructions (Signed)
Preventive Care 40-64 Years, Female Preventive care refers to lifestyle choices and visits with your health care provider that can promote health and wellness. What does preventive care include?  A yearly physical exam. This is also called an annual well check.  Dental exams once or twice a year.  Routine eye exams. Ask your health care provider how often you should have your eyes checked.  Personal lifestyle choices, including: ? Daily care of your teeth and gums. ? Regular physical activity. ? Eating a healthy diet. ? Avoiding tobacco and drug use. ? Limiting alcohol use. ? Practicing safe sex. ? Taking low-dose aspirin daily starting at age 58. ? Taking vitamin and mineral supplements as recommended by your health care provider. What happens during an annual well check? The services and screenings done by your health care provider during your annual well check will depend on your age, overall health, lifestyle risk factors, and family history of disease. Counseling Your health care provider may ask you questions about your:  Alcohol use.  Tobacco use.  Drug use.  Emotional well-being.  Home and relationship well-being.  Sexual activity.  Eating habits.  Work and work Statistician.  Method of birth control.  Menstrual cycle.  Pregnancy history.  Screening You may have the following tests or measurements:  Height, weight, and BMI.  Blood pressure.  Lipid and cholesterol levels. These may be checked every 5 years, or more frequently if you are over 81 years old.  Skin check.  Lung cancer screening. You may have this screening every year starting at age 78 if you have a 30-pack-year history of smoking and currently smoke or have quit within the past 15 years.  Fecal occult blood test (FOBT) of the stool. You may have this test every year starting at age 65.  Flexible sigmoidoscopy or colonoscopy. You may have a sigmoidoscopy every 5 years or a colonoscopy  every 10 years starting at age 30.  Hepatitis C blood test.  Hepatitis B blood test.  Sexually transmitted disease (STD) testing.  Diabetes screening. This is done by checking your blood sugar (glucose) after you have not eaten for a while (fasting). You may have this done every 1-3 years.  Mammogram. This may be done every 1-2 years. Talk to your health care provider about when you should start having regular mammograms. This may depend on whether you have a family history of breast cancer.  BRCA-related cancer screening. This may be done if you have a family history of breast, ovarian, tubal, or peritoneal cancers.  Pelvic exam and Pap test. This may be done every 3 years starting at age 80. Starting at age 36, this may be done every 5 years if you have a Pap test in combination with an HPV test.  Bone density scan. This is done to screen for osteoporosis. You may have this scan if you are at high risk for osteoporosis.  Discuss your test results, treatment options, and if necessary, the need for more tests with your health care provider. Vaccines Your health care provider may recommend certain vaccines, such as:  Influenza vaccine. This is recommended every year.  Tetanus, diphtheria, and acellular pertussis (Tdap, Td) vaccine. You may need a Td booster every 10 years.  Varicella vaccine. You may need this if you have not been vaccinated.  Zoster vaccine. You may need this after age 5.  Measles, mumps, and rubella (MMR) vaccine. You may need at least one dose of MMR if you were born in  1957 or later. You may also need a second dose.  Pneumococcal 13-valent conjugate (PCV13) vaccine. You may need this if you have certain conditions and were not previously vaccinated.  Pneumococcal polysaccharide (PPSV23) vaccine. You may need one or two doses if you smoke cigarettes or if you have certain conditions.  Meningococcal vaccine. You may need this if you have certain  conditions.  Hepatitis A vaccine. You may need this if you have certain conditions or if you travel or work in places where you may be exposed to hepatitis A.  Hepatitis B vaccine. You may need this if you have certain conditions or if you travel or work in places where you may be exposed to hepatitis B.  Haemophilus influenzae type b (Hib) vaccine. You may need this if you have certain conditions.  Talk to your health care provider about which screenings and vaccines you need and how often you need them. This information is not intended to replace advice given to you by your health care provider. Make sure you discuss any questions you have with your health care provider. Document Released: 07/28/2015 Document Revised: 03/20/2016 Document Reviewed: 05/02/2015 Elsevier Interactive Patient Education  2018 [employer]  

## 2018-03-04 NOTE — Assessment & Plan Note (Signed)
Long discussion regarding diet and exercise

## 2018-03-30 ENCOUNTER — Other Ambulatory Visit: Payer: Self-pay | Admitting: Family Medicine

## 2018-03-30 MED ORDER — AMLODIPINE BESYLATE 5 MG PO TABS: 5.0000 mg | ORAL_TABLET | Freq: Every day | ORAL | 3 refills | Status: DC

## 2018-03-30 NOTE — Telephone Encounter (Signed)
[facility] faxed a refill request for a 90-days supply for the following medication. Thanks CC  amLODipine (NORVASC) 5 MG tablet

## 2018-05-16 ENCOUNTER — Encounter: Payer: Self-pay | Admitting: Physician Assistant

## 2018-05-16 ENCOUNTER — Ambulatory Visit: Payer: 59 | Admitting: Physician Assistant

## 2018-05-16 VITALS — BP 128/72 | HR 82 | Temp 98.6°F | Resp 16 | Wt 190.0 lb

## 2018-05-16 DIAGNOSIS — J40 Bronchitis, not specified as acute or chronic: Secondary | ICD-10-CM | POA: Diagnosis not present

## 2018-05-16 MED ORDER — ALBUTEROL SULFATE HFA 108 (90 BASE) MCG/ACT IN AERS
2.0000 | INHALATION_SPRAY | Freq: Four times a day (QID) | RESPIRATORY_TRACT | 2 refills | Status: DC | PRN
Start: 2018-05-16 — End: 2019-07-21

## 2018-05-16 NOTE — Patient Instructions (Signed)

## 2018-05-16 NOTE — Progress Notes (Signed)
Patient: Dana Livingston Female    DOB: [birth year]   47 y.o.   MRN: [id number] Visit Date: 05/16/2018  Today's Provider: Trinna Post, PA-C   Chief Complaint  Patient presents with  . Cough   Subjective:    HPI Patient comes in today c/o cough. Patient report she has had symptoms X 4 weeks. She reports that she has had history of asthma, but she has not had an attack in about 10 years since she went vegan. She reports she had a URI around 04/20/2018. After, she reports that yesterday she had shortness of breath, and her friend gave her a nebulizer treatment and it helped. Patient feels that she may need another treatment and an inhaler.     Allergies  Allergen Reactions  . Ciprofloxacin Palpitations  . Tetracyclines & Related Nausea And Vomiting  . Other Other (See Comments)    Cats cause sneezing     Current Outpatient Medications:  .  ALPRAZolam (XANAX) 0.25 MG tablet, Take 1 tablet (0.25 mg total) by mouth at bedtime as needed for anxiety., Disp: 30 tablet, Rfl: 1 .  amLODipine (NORVASC) 5 MG tablet, Take 1 tablet (5 mg total) by mouth daily., Disp: 90 tablet, Rfl: 3 .  Chaste Tree (VITEX EXTRACT PO), Take 2 capsules by mouth daily., Disp: , Rfl:  .  Cholecalciferol (VITAMIN D) 2000 units CAPS, Take 1 capsule by mouth daily., Disp: , Rfl:  .  Cyanocobalamin (B-12 SL), Place under the tongue., Disp: , Rfl:  .  IRON PO, Take by mouth., Disp: , Rfl:  .  Multiple Vitamin (MULTIVITAMIN) capsule, Take 1 capsule by mouth daily., Disp: , Rfl:  .  PASSION FLOWER-VALERIAN PO, Take by mouth., Disp: , Rfl:  .  TURMERIC PO, Take 2 tablets by mouth daily., Disp: , Rfl:   Review of Systems  Constitutional: Negative.   Respiratory: Positive for cough and shortness of breath. Negative for wheezing.   Cardiovascular: Positive for chest pain. Negative for palpitations and leg swelling.       Describes it as "tightness".   Skin: Negative.   Neurological: Negative for  light-headedness and headaches.    Social History   Tobacco Use  . Smoking status: Never Smoker  . Smokeless tobacco: Never Used  Substance Use Topics  . Alcohol use: No   Objective:   BP 128/72 (BP Location: Right Arm, Patient Position: Sitting, Cuff Size: Large)   Pulse 82   Temp 98.6 F (37 C)   Resp 16   Wt 190 lb (86.2 kg)   SpO2 97%   BMI 32.61 kg/m  Vitals:   05/16/18 0911  BP: 128/72  Pulse: 82  Resp: 16  Temp: 98.6 F (37 C)  SpO2: 97%  Weight: 190 lb (86.2 kg)     Physical Exam  Constitutional: She is oriented to person, place, and time. She appears well-developed and well-nourished.  Cardiovascular: Normal rate and regular rhythm.  Pulmonary/Chest: Effort normal and breath sounds normal.  Neurological: She is alert and oriented to person, place, and time.  Skin: Skin is warm and dry.  Psychiatric: She has a normal mood and affect. Her behavior is normal.        Assessment & Plan:     1. Bronchitis  Do not hear any wheezing on exam today though may have been helped by nebulizer treatment. Will give inhaler as below. Patient declined prednisone. Counseled that cough can linger several weeks to one month  after URI.   - albuterol (PROVENTIL HFA;VENTOLIN HFA) 108 (90 Base) MCG/ACT inhaler; Inhale 2 puffs into the lungs every 6 (six) hours as needed for wheezing or shortness of breath.  Dispense: 1 Inhaler; Refill: 2  Return if symptoms worsen or fail to improve.  The entirety of the information documented in the History of Present Illness, Review of Systems and Physical Exam were personally obtained by me. Portions of this information were initially documented by Wilburt Finlay, CMA and reviewed by me for thoroughness and accuracy.            Trinna Post, PA-C  [facility]

## 2018-05-21 ENCOUNTER — Other Ambulatory Visit: Payer: Self-pay | Admitting: Family Medicine

## 2018-09-03 ENCOUNTER — Ambulatory Visit: Payer: Self-pay | Admitting: Family Medicine

## 2018-09-07 ENCOUNTER — Ambulatory Visit: Payer: Self-pay | Admitting: Family Medicine

## 2018-09-09 ENCOUNTER — Ambulatory Visit (INDEPENDENT_AMBULATORY_CARE_PROVIDER_SITE_OTHER): Payer: 59 | Admitting: Family Medicine

## 2018-09-09 ENCOUNTER — Encounter: Payer: Self-pay | Admitting: Family Medicine

## 2018-09-09 VITALS — BP 151/94 | HR 79 | Temp 98.7°F | Wt 195.4 lb

## 2018-09-09 DIAGNOSIS — Z6833 Body mass index (BMI) 33.0-33.9, adult: Secondary | ICD-10-CM

## 2018-09-09 DIAGNOSIS — F410 Panic disorder [episodic paroxysmal anxiety] without agoraphobia: Secondary | ICD-10-CM | POA: Diagnosis not present

## 2018-09-09 DIAGNOSIS — E669 Obesity, unspecified: Secondary | ICD-10-CM | POA: Diagnosis not present

## 2018-09-09 DIAGNOSIS — F419 Anxiety disorder, unspecified: Secondary | ICD-10-CM | POA: Diagnosis not present

## 2018-09-09 DIAGNOSIS — I10 Essential (primary) hypertension: Secondary | ICD-10-CM

## 2018-09-09 MED ORDER — ALPRAZOLAM 0.25 MG PO TABS: ORAL_TABLET | ORAL | 1 refills | Status: DC

## 2018-09-09 NOTE — Patient Instructions (Signed)

## 2018-09-09 NOTE — Progress Notes (Signed)
Patient: Dana Livingston Female    DOB: [birth year]   48 y.o.   MRN: [id number] Visit Date: 09/09/2018  Today's Provider: Lavon Paganini, MD   Chief Complaint  Patient presents with  . Hypertension   Subjective:    I, Tiburcio Pea, CMA, am acting as a Education administrator for Lavon Paganini, MD.   HPI  Hypertension, follow-up:  BP Readings from Last 3 Encounters:  09/09/18 (!) 151/94  05/16/18 128/72  03/04/18 (!) 156/100   She was last seen for hypertension 6 months ago.  BP at that visit was 156/100. Management since that visit includes continue Amlodipine and low sodium diet  She reports good compliance with treatment. She is not having side effects.  She is exercising. She is adherent to low salt diet.   Outside blood pressures are being checked at home. She reports readings are WNL in the 120s over 70s. She is experiencing none.  Patient denies chest pain, chest pressure/discomfort, claudication, dyspnea, exertional chest pressure/discomfort, fatigue, irregular heart beat, lower extremity edema, near-syncope, orthopnea, palpitations and syncope.   Cardiovascular risk factors include hypertension.  Use of agents associated with hypertension: none.                Weight trend: stable Wt Readings from Last 3 Encounters:  09/09/18 195 lb 6.4 oz (88.6 kg)  05/16/18 190 lb (86.2 kg)  03/04/18 195 lb (88.5 kg)   Current diet: high fiber, low fat/ cholesterol, low salt, vegan, lots of fruits and veggies, Avoids processed foods.  Patient states that her anxiety is fairly well controlled.  She is using Xanax rarely for panic attack symptoms.  Allergies  Allergen Reactions  . Ciprofloxacin Palpitations  . Tetracyclines & Related Nausea And Vomiting  . Other Other (See Comments)    Cats cause sneezing     Current Outpatient Medications:  .  amLODipine (NORVASC) 5 MG tablet, Take 1 tablet (5 mg total) by mouth daily., Disp: 90 tablet, Rfl: 3 .  Chaste Tree (VITEX  EXTRACT PO), Take 2 capsules by mouth daily., Disp: , Rfl:  .  Cholecalciferol (VITAMIN D) 2000 units CAPS, Take 1 capsule by mouth daily., Disp: , Rfl:  .  Cyanocobalamin (B-12 SL), Place under the tongue., Disp: , Rfl:  .  IRON PO, Take by mouth., Disp: , Rfl:  .  Multiple Vitamin (MULTIVITAMIN) capsule, Take 1 capsule by mouth daily., Disp: , Rfl:  .  PASSION FLOWER-VALERIAN PO, Take by mouth., Disp: , Rfl:  .  TURMERIC PO, Take 2 tablets by mouth daily., Disp: , Rfl:  .  albuterol (PROVENTIL HFA;VENTOLIN HFA) 108 (90 Base) MCG/ACT inhaler, Inhale 2 puffs into the lungs every 6 (six) hours as needed for wheezing or shortness of breath. (Patient not taking: Reported on 09/09/2018), Disp: 1 Inhaler, Rfl: 2  Review of Systems  Constitutional: Negative.   Respiratory: Negative.   Cardiovascular: Negative.   Musculoskeletal: Negative.     Social History   Tobacco Use  . Smoking status: Never Smoker  . Smokeless tobacco: Never Used  Substance Use Topics  . Alcohol use: No      Objective:   BP (!) 151/94 (BP Location: Right Arm, Patient Position: Sitting, Cuff Size: Large)   Pulse 79   Temp 98.7 F (37.1 C) (Oral)   Wt 195 lb 6.4 oz (88.6 kg)   SpO2 99%   BMI 33.54 kg/m  Vitals:   09/09/18 1533  BP: (!) 151/94  Pulse: 79  Temp: 98.7 F (37.1 C)  TempSrc: Oral  SpO2: 99%  Weight: 195 lb 6.4 oz (88.6 kg)     Physical Exam Vitals signs reviewed.  Constitutional:      General: She is not in acute distress.    Appearance: Normal appearance. She is well-developed. She is not diaphoretic.  HENT:     Head: Normocephalic and atraumatic.  Eyes:     General: No scleral icterus.    Conjunctiva/sclera: Conjunctivae normal.  Neck:     Musculoskeletal: Neck supple.     Thyroid: No thyromegaly.  Cardiovascular:     Rate and Rhythm: Normal rate and regular rhythm.     Pulses: Normal pulses.     Heart sounds: Normal heart sounds. No murmur.  Pulmonary:     Effort: Pulmonary  effort is normal. No respiratory distress.     Breath sounds: Normal breath sounds. No wheezing or rales.  Musculoskeletal:        General: No deformity.     Right lower leg: No edema.     Left lower leg: No edema.  Lymphadenopathy:     Cervical: No cervical adenopathy.  Skin:    General: Skin is warm and dry.     Capillary Refill: Capillary refill takes less than 2 seconds.     Findings: No rash.  Neurological:     Mental Status: She is alert and oriented to person, place, and time. Mental status is at baseline.     Gait: Gait normal.  Psychiatric:        Mood and Affect: Mood normal.        Behavior: Behavior normal.        Thought Content: Thought content normal.         Assessment & Plan   Problem List Items Addressed This Visit      Cardiovascular and Mediastinum   Hypertension - Primary    Uncontrolled, but has whitecoat hypertension Home blood pressures are well controlled Eating low-sodium diet Continue amlodipine at current dose Recheck BMP Follow-up in 6 months      Relevant Orders   Basic Metabolic Panel (BMET) (Completed)     Other   Anxiety    Fairly well controlled Contributes to hypertension Can continue very low-dose Xanax sparingly for any panic attack symptoms      Relevant Medications   ALPRAZolam (XANAX) 0.25 MG tablet   Panic attacks    Minimal symptoms and only rarely using Xanax Refill of this was given      Relevant Medications   ALPRAZolam (XANAX) 0.25 MG tablet   Obesity    Discussed diet and exercise and healthy weight management          Return in about 6 months (around 03/10/2019) for CPE.   The entirety of the information documented in the History of Present Illness, Review of Systems and Physical Exam were personally obtained by me. Portions of this information were initially documented by Tiburcio Pea, CMA and reviewed by me for thoroughness and accuracy.    Virginia Crews, MD, MPH [facility] 09/10/2018 10:10 AM

## 2018-09-10 LAB — BASIC METABOLIC PANEL
BUN / CREAT RATIO: 11 (ref 9–23)
BUN: 8 mg/dL (ref 6–24)
CHLORIDE: 101 mmol/L (ref 96–106)
CO2: 21 mmol/L (ref 20–29)
Calcium: 9.3 mg/dL (ref 8.7–10.2)
Creatinine, Ser: 0.74 mg/dL (ref 0.57–1.00)
GFR calc Af Amer: 110 mL/min/{1.73_m2} (ref >59)
GFR calc non Af Amer: 95 mL/min/{1.73_m2} (ref >59)
Glucose: 89 mg/dL (ref 65–99)
POTASSIUM: 4.2 mmol/L (ref 3.5–5.2)
SODIUM: 138 mmol/L (ref 134–144)

## 2018-09-10 NOTE — Assessment & Plan Note (Signed)
Discussed diet and exercise and healthy weight management 

## 2018-09-10 NOTE — Assessment & Plan Note (Signed)
Minimal symptoms and only rarely using Xanax Refill of this was given

## 2018-09-10 NOTE — Assessment & Plan Note (Signed)
Uncontrolled, but has whitecoat hypertension Home blood pressures are well controlled Eating low-sodium diet Continue amlodipine at current dose Recheck BMP Follow-up in 6 months

## 2018-09-10 NOTE — Assessment & Plan Note (Signed)
Fairly well controlled Contributes to hypertension Can continue very low-dose Xanax sparingly for any panic attack symptoms

## 2019-01-14 LAB — HEMOGLOBIN A1C: Hemoglobin A1C: 5.1

## 2019-01-14 LAB — LIPID PANEL
Cholesterol: 161 (ref 0–200)
HDL: 39 (ref 35–70)
LDL Cholesterol: 99
LDl/HDL Ratio: 4.1
Triglycerides: 117 (ref 40–160)

## 2019-01-14 LAB — BASIC METABOLIC PANEL
Creatinine: 0.8 (ref <=1.1)
Glucose: 92

## 2019-03-11 ENCOUNTER — Telehealth: Payer: Self-pay

## 2019-03-11 ENCOUNTER — Encounter: Payer: Self-pay | Admitting: Family Medicine

## 2019-03-11 ENCOUNTER — Other Ambulatory Visit: Payer: Self-pay

## 2019-03-11 ENCOUNTER — Ambulatory Visit (INDEPENDENT_AMBULATORY_CARE_PROVIDER_SITE_OTHER): Payer: 59 | Admitting: Family Medicine

## 2019-03-11 VITALS — BP 147/86 | HR 83 | Temp 97.3°F | Ht 64.0 in | Wt 193.8 lb

## 2019-03-11 DIAGNOSIS — F410 Panic disorder [episodic paroxysmal anxiety] without agoraphobia: Secondary | ICD-10-CM

## 2019-03-11 DIAGNOSIS — B078 Other viral warts: Secondary | ICD-10-CM

## 2019-03-11 DIAGNOSIS — Z23 Encounter for immunization: Secondary | ICD-10-CM | POA: Diagnosis not present

## 2019-03-11 DIAGNOSIS — Z6833 Body mass index (BMI) 33.0-33.9, adult: Secondary | ICD-10-CM

## 2019-03-11 DIAGNOSIS — Z0000 Encounter for general adult medical examination without abnormal findings: Secondary | ICD-10-CM

## 2019-03-11 DIAGNOSIS — I10 Essential (primary) hypertension: Secondary | ICD-10-CM

## 2019-03-11 DIAGNOSIS — E669 Obesity, unspecified: Secondary | ICD-10-CM

## 2019-03-11 MED ORDER — AMLODIPINE BESYLATE 5 MG PO TABS: 5.0000 mg | ORAL_TABLET | Freq: Every day | ORAL | 3 refills | Status: DC

## 2019-03-11 MED ORDER — ALPRAZOLAM 0.25 MG PO TABS: ORAL_TABLET | ORAL | 1 refills | Status: DC

## 2019-03-11 NOTE — Assessment & Plan Note (Signed)
Discussed importance of healthy weight management Discussed diet and exercise  

## 2019-03-11 NOTE — Assessment & Plan Note (Signed)
Uncontrolled today, but well controlled on home readings White coat syndrome Continue DASH diet Continue amllodipine Recheck CMP F/u in 6 months

## 2019-03-11 NOTE — Telephone Encounter (Signed)
Told Dana Livingston that this would be changed, However, when going into patient's history the surgical procedure was created from a surgical log. Called IT and a ticket is put in to have access to change surgical procedure in EMR.

## 2019-03-11 NOTE — Assessment & Plan Note (Signed)
Discussed at home remedies and treatments Patient would like to go ahead with cryotherapy in office today Area was cleansed with alcohol swab Informed consent obtained.  Cryopen applied for 10 seconds, removed and then applied again for freeze, thaw, refreeze cycle. Patient tolerated procedure well. No complications Discussed that additional treatments are often necessary in ~8 weeks

## 2019-03-11 NOTE — Assessment & Plan Note (Signed)
Minimal symptoms and only rarely using Xanx Refill given

## 2019-03-11 NOTE — Progress Notes (Signed)
Patient: Dana Livingston, Female    DOB: August 07, 1970, 48 y.o.   MRN: [id number] Visit Date: 03/11/2019  Today's Provider: Lavon Paganini, MD   Chief Complaint  Patient presents with  . Annual Exam   Subjective:    Annual physical exam Dana Livingston is a 48 y.o. female who presents today for health maintenance and complete physical. She feels well. She reports exercising includes walking. She reports she is sleeping well.  ----------------------------------------------------------------- Last Pap:01/18/2015 Last mammogram:  Patient declines and will reassess next year  Home BPs: 120s/80. White coat syndrome  Has wart on lateral surface of L 4th toe.  Unable to reach to try at home remedies  Review of Systems  Constitutional: Negative.   HENT: Negative.   Eyes: Negative.   Respiratory: Negative.   Cardiovascular: Negative.   Gastrointestinal: Negative.   Endocrine: Negative.   Genitourinary: Negative.   Musculoskeletal: Negative.   Skin: Negative.   Allergic/Immunologic: Negative.   Neurological: Negative.   Hematological: Negative.   Psychiatric/Behavioral: Negative.     Social History She  reports that she has never smoked. She has never used smokeless tobacco. She reports that she does not drink alcohol or use drugs. Social History   Socioeconomic History  . Marital status: Married    Spouse name: Grafton Folk  . Number of children: 0  . Years of education: masters x 2  . Highest education level: Not on file  Occupational History    Employer: [employer]  . Financial resource strain: Not on file  . Food insecurity    Worry: Not on file    Inability: Not on file  . Transportation needs    Medical: Not on file    Non-medical: Not on file  Tobacco Use  . Smoking status: Never Smoker  . Smokeless tobacco: Never Used  Substance and Sexual Activity  . Alcohol use: No  . Drug use: No  . Sexual activity: Yes    Birth  control/protection: None  Lifestyle  . Physical activity    Days per week: Not on file    Minutes per session: Not on file  . Stress: Not on file  Relationships  . Social Herbalist on phone: Not on file    Gets together: Not on file    Attends religious service: Not on file    Active member of club or organization: Not on file    Attends meetings of clubs or organizations: Not on file    Relationship status: Not on file  Other Topics Concern  . Not on file  Social History Narrative  . Not on file    Patient Active Problem List   Diagnosis Date Noted  . 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 Obesity 03/26/2017  . Hypertension   . Anxiety   . Panic attacks   . 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 Elevated cancer antigen 125 (CA-125) 01/18/2015  . Fibroids 01/18/2015    Past Surgical History:  Procedure Laterality Date  . CHOLECYSTECTOMY  1991  . LAPAROSCOPIC OVARIAN CYSTECTOMY Left 1998  . LASIK  2001  . ROBOTIC ASSISTED TOTAL HYSTERECTOMY WITH BILATERAL SALPINGO OOPHERECTOMY Bilateral 02/14/2015   Procedure: ROBOTIC ASSISTED RIGHT OOPHORECTOMY ;  Surgeon: [name], MD;  Location: [facility];  Service: Gynecology;  Laterality: Bilateral;  . TONSILLECTOMY  1981  . WISDOM TOOTH EXTRACTION      Family History  Family Status  Relation Name Status  .  Mother  Alive  . Father  Alive  . MGM  Deceased  . MGF  Deceased  . Annamarie Major  (Not Specified)  . Neg Hx  (Not Specified)   Her family history includes COPD in her father and maternal grandfather; Colon cancer in her paternal uncle; Diabetes in her father; Heart failure in her father; Hypertension in her father and mother; Lung cancer in her maternal grandmother; Thyroid disease in her mother.     Allergies  Allergen Reactions  . Ciprofloxacin Palpitations  . Tetracyclines & Related Nausea And Vomiting  . Other Other (See Comments)    Cats cause sneezing    Previous Medications   ALBUTEROL (PROVENTIL HFA;VENTOLIN  HFA) 108 (90 BASE) MCG/ACT INHALER    Inhale 2 puffs into the lungs every 6 (six) hours as needed for wheezing or shortness of breath.   CHASTE TREE (VITEX EXTRACT PO)    Take 2 capsules by mouth daily.   CHOLECALCIFEROL (VITAMIN D) 2000 UNITS CAPS    Take 1 capsule by mouth daily.   CYANOCOBALAMIN (B-12 SL)    Place under the tongue.   IRON PO    Take by mouth.   MULTIPLE VITAMIN (MULTIVITAMIN) CAPSULE    Take 1 capsule by mouth daily.   PASSION FLOWER-VALERIAN PO    Take by mouth.   TURMERIC PO    Take 2 tablets by mouth daily.    Patient Care Team: Virginia Crews, MD as PCP - General (Family Medicine)      Objective:   Vitals: BP (!) 147/86 (BP Location: Left Arm, Patient Position: Sitting, Cuff Size: Large)   Pulse 83   Temp (!) 97.3 F (36.3 C) (Temporal)   Ht 5\' 4"  (1.626 m)   Wt 193 lb 12.8 oz (87.9 kg)   BMI 33.27 kg/m    Physical Exam Vitals signs reviewed.  Constitutional:      General: She is not in acute distress.    Appearance: Normal appearance. She is well-developed. She is not diaphoretic.  HENT:     Head: Normocephalic and atraumatic.     Right Ear: Tympanic membrane, ear canal and external ear normal.     Left Ear: Tympanic membrane, ear canal and external ear normal.  Eyes:     General: No scleral icterus.    Extraocular Movements: Extraocular movements intact.     Conjunctiva/sclera: Conjunctivae normal.     Pupils: Pupils are equal, round, and reactive to light.  Neck:     Musculoskeletal: Neck supple.     Thyroid: No thyromegaly.  Cardiovascular:     Rate and Rhythm: Normal rate and regular rhythm.     Pulses: Normal pulses.     Heart sounds: Normal heart sounds. No murmur.  Pulmonary:     Effort: Pulmonary effort is normal. No respiratory distress.     Breath sounds: Normal breath sounds. No wheezing or rales.  Abdominal:     General: There is no distension.     Palpations: Abdomen is soft.     Tenderness: There is no abdominal  tenderness.  Musculoskeletal:        General: No deformity.     Right lower leg: No edema.     Left lower leg: No edema.  Lymphadenopathy:     Cervical: No cervical adenopathy.  Skin:    General: Skin is warm and dry.     Capillary Refill: Capillary refill takes less than 2 seconds.     Findings: No rash.  Neurological:     Mental Status: She is alert and oriented to person, place, and time. Mental status is at baseline.  Psychiatric:        Mood and Affect: Mood normal.        Behavior: Behavior normal.        Thought Content: Thought content normal.      Depression Screen PHQ 2/9 Scores 03/11/2019 03/04/2018 03/26/2017  PHQ - 2 Score 0 0 0  PHQ- 9 Score 0 - -      Assessment & Plan:     Routine Health Maintenance and Physical Exam  Exercise Activities and Dietary recommendations Goals   None     Immunization History  Administered Date(s) Administered  . Influenza,inj,Quad PF,6+ Mos 03/11/2019  . Tdap 03/04/2018    Health Maintenance  Topic Date Due  . HIV Screening  06/29/1984  . INFLUENZA VACCINE  02/13/2019  . PAP SMEAR-Modifier  01/18/2020  . TETANUS/TDAP  03/04/2028     Discussed health benefits of physical activity, and encouraged her to engage in regular exercise appropriate for her age and condition.    --------------------------------------------------------------------  Problem List Items Addressed This Visit      Cardiovascular and Mediastinum   Hypertension    Uncontrolled today, but well controlled on home readings White coat syndrome Continue DASH diet Continue amllodipine Recheck CMP F/u in 6 months      Relevant Medications   amLODipine (NORVASC) 5 MG tablet     Other   Panic attacks    Minimal symptoms and only rarely using Xanx Refill given      Relevant Medications   ALPRAZolam (XANAX) 0.25 MG tablet   Obesity    Discussed importance of healthy weight management Discussed diet and exercise       Other viral warts     Discussed at home remedies and treatments Patient would like to go ahead with cryotherapy in office today Area was cleansed with alcohol swab Informed consent obtained.  Cryopen applied for 10 seconds, removed and then applied again for freeze, thaw, refreeze cycle. Patient tolerated procedure well. No complications Discussed that additional treatments are often necessary in ~8 weeks       Other Visit Diagnoses    Encounter for annual physical exam    -  Primary   Need for influenza vaccination       Relevant Orders   Flu Vaccine QUAD 36+ mos IM (Completed)       Return in about 6 months (around 09/11/2019) for chronic disease f/u.   The entirety of the information documented in the History of Present Illness, Review of Systems and Physical Exam were personally obtained by me. Portions of this information were initially documented by [facility], CMA and reviewed by me for thoroughness and accuracy.    Bacigalupo, Dionne Bucy, MD MPH [facility]

## 2019-03-11 NOTE — Patient Instructions (Signed)
Preventive Care 20-50 Years Old, Female Preventive care refers to visits with your health care provider and lifestyle choices that can promote health and wellness. This includes:  A yearly physical exam. This may also be called an annual well check.  Regular dental visits and eye exams.  Immunizations.  Screening for certain conditions.  Healthy lifestyle choices, such as eating a healthy diet, getting regular exercise, not using drugs or products that contain nicotine and tobacco, and limiting alcohol use. What can I expect for my preventive care visit? Physical exam Your health care provider will check your:  Height and weight. This may be used to calculate body mass index (BMI), which tells if you are at a healthy weight.  Heart rate and blood pressure.  Skin for abnormal spots. Counseling Your health care provider may ask you questions about your:  Alcohol, tobacco, and drug use.  Emotional well-being.  Home and relationship well-being.  Sexual activity.  Eating habits.  Work and work Statistician.  Method of birth control.  Menstrual cycle.  Pregnancy history. What immunizations do I need?  Influenza (flu) vaccine  This is recommended every year. Tetanus, diphtheria, and pertussis (Tdap) vaccine  You may need a Td booster every 10 years. Varicella (chickenpox) vaccine  You may need this if you have not been vaccinated. Zoster (shingles) vaccine  You may need this after age 28. Measles, mumps, and rubella (MMR) vaccine  You may need at least one dose of MMR if you were born in 1957 or later. You may also need a second dose. Pneumococcal conjugate (PCV13) vaccine  You may need this if you have certain conditions and were not previously vaccinated. Pneumococcal polysaccharide (PPSV23) vaccine  You may need one or two doses if you smoke cigarettes or if you have certain conditions. Meningococcal conjugate (MenACWY) vaccine  You may need this if you  have certain conditions. Hepatitis A vaccine  You may need this if you have certain conditions or if you travel or work in places where you may be exposed to hepatitis A. Hepatitis B vaccine  You may need this if you have certain conditions or if you travel or work in places where you may be exposed to hepatitis B. Haemophilus influenzae type b (Hib) vaccine  You may need this if you have certain conditions. Human papillomavirus (HPV) vaccine  If recommended by your health care provider, you may need three doses over 6 months. You may receive vaccines as individual doses or as more than one vaccine together in one shot (combination vaccines). Talk with your health care provider about the risks and benefits of combination vaccines. What tests do I need? Blood tests  Lipid and cholesterol levels. These may be checked every 5 years, or more frequently if you are over 68 years old.  Hepatitis C test.  Hepatitis B test. Screening  Lung cancer screening. You may have this screening every year starting at age 57 if you have a 30-pack-year history of smoking and currently smoke or have quit within the past 15 years.  Colorectal cancer screening. All adults should have this screening starting at age 52 and continuing until age 44. Your health care provider may recommend screening at age 30 if you are at increased risk. You will have tests every 1-10 years, depending on your results and the type of screening test.  Diabetes screening. This is done by checking your blood sugar (glucose) after you have not eaten for a while (fasting). You may have this  done every 1-3 years.  Mammogram. This may be done every 1-2 years. Talk with your health care provider about when you should start having regular mammograms. This may depend on whether you have a family history of breast cancer.  BRCA-related cancer screening. This may be done if you have a family history of breast, ovarian, tubal, or peritoneal  cancers.  Pelvic exam and Pap test. This may be done every 3 years starting at age 24. Starting at age 66, this may be done every 5 years if you have a Pap test in combination with an HPV test. Other tests  Sexually transmitted disease (STD) testing.  Bone density scan. This is done to screen for osteoporosis. You may have this scan if you are at high risk for osteoporosis. Follow these instructions at home: Eating and drinking  Eat a diet that includes fresh fruits and vegetables, whole grains, lean protein, and low-fat dairy.  Take vitamin and mineral supplements as recommended by your health care provider.  Do not drink alcohol if: ? Your health care provider tells you not to drink. ? You are pregnant, may be pregnant, or are planning to become pregnant.  If you drink alcohol: ? Limit how much you have to 0-1 drink a day. ? Be aware of how much alcohol is in your drink. In the [place] one drink equals one 12 oz bottle of beer (355 mL), one 5 oz glass of wine (148 mL), or one 1 oz glass of hard liquor (44 mL). Lifestyle  Take daily care of your teeth and gums.  Stay active. Exercise for at least 30 minutes on 5 or more days each week.  Do not use any products that contain nicotine or tobacco, such as cigarettes, e-cigarettes, and chewing tobacco. If you need help quitting, ask your health care provider.  If you are sexually active, practice safe sex. Use a condom or other form of birth control (contraception) in order to prevent pregnancy and STIs (sexually transmitted infections).  If told by your health care provider, take low-dose aspirin daily starting at age 45. What's next?  Visit your health care provider once a year for a well check visit.  Ask your health care provider how often you should have your eyes and teeth checked.  Stay up to date on all vaccines. This information is not intended to replace advice given to you by your health care provider. Make sure you  discuss any questions you have with your health care provider. Document Released: 07/28/2015 Document Revised: 03/12/2018 Document Reviewed: 03/12/2018 Elsevier Patient Education  2020 [employer].

## 2019-04-14 ENCOUNTER — Encounter: Payer: Self-pay | Admitting: Gynecologic Oncology

## 2019-05-04 ENCOUNTER — Encounter (HOSPITAL_COMMUNITY): Payer: Self-pay | Admitting: Gynecologic Oncology

## 2019-05-04 ENCOUNTER — Telehealth: Payer: Self-pay

## 2019-05-05 NOTE — Telephone Encounter (Signed)
ENCOUNTER OPENED IN ERROR

## 2019-06-21 ENCOUNTER — Telehealth: Payer: Self-pay

## 2019-06-21 ENCOUNTER — Encounter (HOSPITAL_COMMUNITY): Payer: Self-pay | Admitting: Gynecologic Oncology

## 2019-06-21 NOTE — Telephone Encounter (Signed)
rom: Barron [email or web address]> Sent: Monday, June 21, 2019 2:49 PM To: Tamika [email or web address]> Cc: [email or web address] [email or web address]>; Arline Asp [email or web address]>; Leta [email or web address]> Subject: Secure: question           This is what I see plus bottom right I forgot to put arrow. What do you see? Can you send me a screenshot of what you are seeing as wrong?  Thanks     Barron Schmid, RN, BS, CNOR, RNFA  [employer], AMB Certified [place]  Direct Dial: [phone]  Fax: [phone]  Cell: [phone]  [email or web address] [email or web address]

## 2019-06-21 NOTE — Telephone Encounter (Signed)
ENCOUNTER OPENED IN ERROR

## 2019-06-22 NOTE — Telephone Encounter (Signed)
LM for patient that the correct surgical history now in her epic record.  It does not say that she has had a total hysterectomy and BSO. It was IT department that worked on getting the documentation correct.

## 2019-06-22 NOTE — Telephone Encounter (Signed)
LM for patient that the correct surgical history now in her epic record.  It does not say that she has had a total hysterectomy and BSO. It was it department that worked on getting the documentation correct.

## 2019-06-22 NOTE — Telephone Encounter (Signed)
Sent e-mail to Bethena Roys that this nurse sees the corrected surgical history.

## 2019-07-12 ENCOUNTER — Ambulatory Visit: Payer: Self-pay | Admitting: Family Medicine

## 2019-07-12 NOTE — Telephone Encounter (Signed)
Pt reports 07/04/2019 "Pulled muscle" lower back, right side. States she has done this before, same area. States "Felt better over weekend, at work today, sneezed, spasms again." Pt requesting "Muscle relaxer be called in" as no availability until Wednesday.States she had called walk in clinics who either would not see her or "Two hour wait." States "I don't think I can even get to an appt."  Call placed to practice, reiterated would need to be seen first. Pt tearful, states "I just need a few, I don't take drugs, I don't drink, they know my history, I'm not drug seeking." Made aware NT would route to practice for review. Please advise: CB# [id number]  Reason for Disposition . [1] MODERATE back pain (e.g., interferes with normal activities) AND [2] present > 3 days  Answer Assessment - Initial Assessment Questions 1. ONSET: "When did the pain begin?"     12/20 2. LOCATION: "Where does it hurt?" (upper, mid or lower back)       Lower back, across both sides 3. SEVERITY: "How bad is the pain?"  (e.g., Scale 1-10; mild, moderate, or severe)   - MILD (1-3): doesn't interfere with normal activities    - MODERATE (4-7): interferes with normal activities or awakens from sleep    - SEVERE (8-10): excruciating pain, unable to do any normal activities     Moderate- severe spasms "Locked up." 4. PATTERN: "Is the pain constant?" (e.g., yes, no; constant, intermittent)      spasms 5. RADIATION: "Does the pain shoot into your legs or elsewhere?"      6. CAUSE:  "What do you think is causing the back pain?"      H/O 7. BACK OVERUSE:  "Any recent lifting of heavy objects, strenuous work or exercise?"     *No Answer* 8. MEDICATIONS: "What have you taken so far for the pain?" (e.g., nothing, acetaminophen, NSAIDS)     9. [place] SYMPTOMS: "Do you have any weakness, numbness, or problems with bowel/bladder control?"     no 10HER SYMPTOMS: "Do you have any other symptoms?" (e.g., fever, abdominal pain,  burning with urination, blood in urine)        no  Protocols used: BACK PAIN-A-AH

## 2019-07-13 NOTE — Telephone Encounter (Signed)
Patient was advised and states that she will wait until tomorrow for her visit.FYI

## 2019-07-13 NOTE — Telephone Encounter (Signed)
No medications without office visit or assessment. If she cannot wait until tomorrow she can consider an urgent care.

## 2019-07-13 NOTE — Telephone Encounter (Signed)
Patient of Dr B that is requesting medications

## 2019-07-14 ENCOUNTER — Other Ambulatory Visit: Payer: Self-pay | Admitting: Physician Assistant

## 2019-07-14 ENCOUNTER — Ambulatory Visit (INDEPENDENT_AMBULATORY_CARE_PROVIDER_SITE_OTHER): Payer: No Typology Code available for payment source | Admitting: Physician Assistant

## 2019-07-14 DIAGNOSIS — M545 Low back pain, unspecified: Secondary | ICD-10-CM

## 2019-07-14 MED ORDER — CYCLOBENZAPRINE HCL 10 MG PO TABS: 10.0000 mg | ORAL_TABLET | Freq: Three times a day (TID) | ORAL | 0 refills | Status: DC | PRN

## 2019-07-14 MED ORDER — MELOXICAM 15 MG PO TABS: 15.0000 mg | ORAL_TABLET | Freq: Every day | ORAL | 0 refills | Status: DC

## 2019-07-14 NOTE — Telephone Encounter (Signed)
Requested medication (s) are due for refill today: yes  Requested medication (s) are on the active medication list: yes  Last refill:  07/14/2019  Future visit scheduled: no  Notes to clinic: Patient requesting 90 day supply   Requested Prescriptions  Pending Prescriptions Disp Refills   meloxicam (MOBIC) 15 MG tablet [Pharmacy Med Name: MELOXICAM 15MG  TABLETS] 90 tablet     Sig: TAKE 1 TABLET(15 MG) BY MOUTH DAILY      Analgesics:  COX2 Inhibitors Failed - 07/14/2019 12:02 PM      Failed - HGB in normal range and within 360 days    Hemoglobin  Date Value Ref Range Status  02/13/2017 13.5 12.0 - 16.0 g/dL Final          Passed - Cr in normal range and within 360 days    Creatinine  Date Value Ref Range Status  01/14/2019 0.8 0.5 - 1.1 Final   Creatinine, Ser  Date Value Ref Range Status  09/09/2018 0.74 0.57 - 1.00 mg/dL Final          Passed - Patient is not pregnant      Passed - Valid encounter within last 12 months    Recent Outpatient Visits           Today Acute bilateral low back pain without sciatica   [facility] Trinna Post, PA-C   4 months ago Encounter for annual physical exam   [facility], Dionne Bucy, MD   10 months ago Essential hypertension   [facility], Dionne Bucy, MD   1 year ago [facility] Trinna Post, [place]   1 year ago Encounter for annual physical exam   [facility], Dionne Bucy, MD

## 2019-07-14 NOTE — Progress Notes (Signed)
Patient: Dana Livingston Female    DOB: 20-Nov-1970   48 y.o.   MRN: [id number] Visit Date: 07/14/2019  Today's Provider: Trinna Post, PA-C   Chief Complaint  Patient presents with  . Back Pain   Subjective:    I, Dana Livingston CMA am acting as a Education administrator for [employer].   Virtual Visit via Telephone Note  I connected with Dana Livingston on 07/14/19 at 11:20 AM EST by telephone and verified that I am speaking with the correct person using two identifiers.  Location: Patient: Home Provider: Office   I discussed the limitations, risks, security and privacy concerns of performing an evaluation and management service by telephone and the availability of in person appointments. I also discussed with the patient that there may be a patient responsible charge related to this service. The patient expressed understanding and agreed to proceed.    Back Pain This is a new problem. The current episode started 1 to 4 weeks ago. The problem is unchanged. The quality of the pain is described as cramping (spasming). Radiates to: legs and hip. The pain is moderate. The pain is the same all the time. The symptoms are aggravated by position, lying down, sitting and standing. Pertinent negatives include no abdominal pain, chest pain, pelvic pain or tingling. She has tried nothing for the symptoms.   Patient reports hurting her back on the weekend of 12/20 and this improved with some OTC remedies. On 07/08/2019 she reports sneezing which worsened her pain. She reports 07/10/2019 the pain worsened. Reports this improved on 12/27 and 12/28. On Monday she reported some cramping and spasming which made walking difficult. No incontinence.   Allergies  Allergen Reactions  . Ciprofloxacin Palpitations  . Tetracyclines & Related Nausea And Vomiting  . Other Other (See Comments)    Cats cause sneezing     Current Outpatient Medications:  .  ALPRAZolam (XANAX) 0.25 MG tablet, TAKE 1  TABLET(0.25 MG) BY MOUTH AT BEDTIME AS NEEDED FOR ANXIETY, Disp: 30 tablet, Rfl: 1 .  amLODipine (NORVASC) 5 MG tablet, Take 1 tablet (5 mg total) by mouth daily., Disp: 90 tablet, Rfl: 3 .  Chaste Tree (VITEX EXTRACT PO), Take 2 capsules by mouth daily., Disp: , Rfl:  .  Cholecalciferol (VITAMIN D) 2000 units CAPS, Take 1 capsule by mouth daily., Disp: , Rfl:  .  Cyanocobalamin (B-12 SL), Place under the tongue., Disp: , Rfl:  .  IRON PO, Take by mouth., Disp: , Rfl:  .  Multiple Vitamin (MULTIVITAMIN) capsule, Take 1 capsule by mouth daily., Disp: , Rfl:  .  PASSION FLOWER-VALERIAN PO, Take by mouth., Disp: , Rfl:  .  TURMERIC PO, Take 2 tablets by mouth daily., Disp: , Rfl:  .  albuterol (PROVENTIL HFA;VENTOLIN HFA) 108 (90 Base) MCG/ACT inhaler, Inhale 2 puffs into the lungs every 6 (six) hours as needed for wheezing or shortness of breath. (Patient not taking: Reported on 09/09/2018), Disp: 1 Inhaler, Rfl: 2  Review of Systems  Cardiovascular: Negative for chest pain.  Gastrointestinal: Negative for abdominal pain.  Genitourinary: Negative for pelvic pain.  Musculoskeletal: Positive for back pain.  Neurological: Negative for tingling.    Social History   Tobacco Use  . Smoking status: Never Smoker  . Smokeless tobacco: Never Used  Substance Use Topics  . Alcohol use: No      Objective:   There were no vitals taken for this visit. There were no vitals filed  for this visit.There is no height or weight on file to calculate BMI.   Physical Exam   No results found for any visits on 07/14/19.     Assessment & Plan    1. Acute bilateral low back pain without sciatica  - cyclobenzaprine (FLEXERIL) 10 MG tablet; Take 1 tablet (10 mg total) by mouth 3 (three) times daily as needed for muscle spasms.  Dispense: 30 tablet; Refill: 0 - meloxicam (MOBIC) 15 MG tablet; Take 1 tablet (15 mg total) by mouth daily.  Dispense: 30 tablet; Refill: 0  I discussed the assessment and  treatment plan with the patient. The patient was provided an opportunity to ask questions and all were answered. The patient agreed with the plan and demonstrated an understanding of the instructions.   The patient was advised to call back or seek an in-person evaluation if the symptoms worsen or if the condition fails to improve as anticipated.  I provided 15 minutes of non-face-to-face time during this encounter.  The entirety of the information documented in the History of Present Illness, Review of Systems and Physical Exam were personally obtained by me. Portions of this information were initially documented by [facility] and reviewed by me for thoroughness and accuracy.      Trinna Post, PA-C  [facility]

## 2019-07-15 ENCOUNTER — Telehealth: Payer: Self-pay | Admitting: *Deleted

## 2019-07-15 ENCOUNTER — Ambulatory Visit: Payer: Self-pay

## 2019-07-15 DIAGNOSIS — S39012A Strain of muscle, fascia and tendon of lower back, initial encounter: Secondary | ICD-10-CM

## 2019-07-15 MED ORDER — METHYLPREDNISOLONE 4 MG PO TBPK
ORAL_TABLET | ORAL | 0 refills | Status: DC
Start: 2019-07-15 — End: 2019-07-21

## 2019-07-15 NOTE — Telephone Encounter (Signed)
Stop mobic, start medrol dose pak

## 2019-07-15 NOTE — Telephone Encounter (Signed)
Patient was advised and states that she would go pick up the medication.

## 2019-07-15 NOTE — Telephone Encounter (Signed)
Copied from [place] [phone]. Topic: General - Inquiry >> Jul 15, 2019 10:52 AM Alease Frame wrote: Reason for CRM: Patient called in wanting to speak with Nurse Magda Paganini . Regarding her recent messages . Please call back [phone]

## 2019-07-15 NOTE — Telephone Encounter (Signed)
Pt. Is calling back. Upset that a Medrol Dose pack was sent in."I don't want to take that.I want pain medication." Instructed pt. That the office is now closed for the holiday. Instructed pt. That Medrol dose packs are very effective for back pain. Pt. Upset states "I've been calling since Monday to get pain medicine." Instructed to go to UC or ED for evaluation of low back pain.

## 2019-07-15 NOTE — Telephone Encounter (Signed)
Pt. Reports she was seen yesterday for her back pain and given Flexeril and Mobic. The medication is not helping her pain "at all. I've only had side effects from the medicine - nausea, diarrhea." Requesting something else be prescribed for her pain. Please advise pt.  Answer Assessment - Initial Assessment Questions 1. ONSET: "When did the pain begin?"      Started 2 weeks ago 2. LOCATION: "Where does it hurt?" (upper, mid or lower back)     Low back 3. SEVERITY: "How bad is the pain?"  (e.g., Scale 1-10; mild, moderate, or severe)   - MILD (1-3): doesn't interfere with normal activities    - MODERATE (4-7): interferes with normal activities or awakens from sleep    - SEVERE (8-10): excruciating pain, unable to do any normal activities       6 4. PATTERN: "Is the pain constant?" (e.g., yes, no; constant, intermittent)      Comes and goes 5. RADIATION: "Does the pain shoot into your legs or elsewhere?"     Legs 6. CAUSE:  "What do you think is causing the back pain?"      Muscle 7. BACK OVERUSE:  "Any recent lifting of heavy objects, strenuous work or exercise?"     Yes 8. MEDICATIONS: "What have you taken so far for the pain?" (e.g., nothing, acetaminophen, NSAIDS)     Flexeril, Mobic 9. NEUROLOGIC SYMPTOMS: "Do you have any weakness, numbness, or problems with bowel/bladder control?"     Cramping 10. OTHER SYMPTOMS: "Do you have any other symptoms?" (e.g., fever, abdominal pain, burning with urination, blood in urine)       No 11. PREGNANCY: "Is there any chance you are pregnant?" (e.g., yes, no; LMP)       No  Protocols used: BACK PAIN-A-AH

## 2019-07-15 NOTE — Telephone Encounter (Signed)
Please review for Adriana.   Thanks,   -Mickel Baas

## 2019-07-16 ENCOUNTER — Emergency Department: Payer: No Typology Code available for payment source

## 2019-07-16 ENCOUNTER — Other Ambulatory Visit: Payer: Self-pay

## 2019-07-16 ENCOUNTER — Emergency Department
Admission: EM | Admit: 2019-07-16 | Discharge: 2019-07-16 | Disposition: A | Discharge status: Home / Self Care | Payer: No Typology Code available for payment source | Attending: Emergency Medicine | Admitting: Emergency Medicine

## 2019-07-16 ENCOUNTER — Encounter: Payer: Self-pay | Admitting: Emergency Medicine

## 2019-07-16 DIAGNOSIS — I10 Essential (primary) hypertension: Secondary | ICD-10-CM | POA: Insufficient documentation

## 2019-07-16 DIAGNOSIS — M5416 Radiculopathy, lumbar region: Secondary | ICD-10-CM

## 2019-07-16 DIAGNOSIS — M549 Dorsalgia, unspecified: Secondary | ICD-10-CM | POA: Diagnosis present

## 2019-07-16 DIAGNOSIS — M545 Low back pain, unspecified: Secondary | ICD-10-CM

## 2019-07-16 DIAGNOSIS — Z79899 Other long term (current) drug therapy: Secondary | ICD-10-CM | POA: Insufficient documentation

## 2019-07-16 LAB — URINALYSIS, COMPLETE (UACMP) WITH MICROSCOPIC
Bacteria, UA: NONE SEEN
Bilirubin Urine: NEGATIVE
Glucose, UA: NEGATIVE mg/dL
Ketones, ur: 5 mg/dL — AB
Leukocytes,Ua: NEGATIVE
Nitrite: NEGATIVE
Protein, ur: 30 mg/dL — AB
Specific Gravity, Urine: 1.01 (ref 1.005–1.030)
pH: 6 (ref 5.0–8.0)

## 2019-07-16 LAB — POCT PREGNANCY, URINE: Preg Test, Ur: NEGATIVE

## 2019-07-16 MED ORDER — ORPHENADRINE CITRATE 30 MG/ML IJ SOLN
30.0000 mg | Freq: Two times a day (BID) | INTRAMUSCULAR | Status: DC
  Administered 2019-07-16: 19:00:00 30 mg via INTRAMUSCULAR
  Filled 2019-07-16: qty 2

## 2019-07-16 MED ORDER — PREDNISONE 10 MG PO TABS: ORAL_TABLET | ORAL | 0 refills | Status: DC

## 2019-07-16 MED ORDER — KETOROLAC TROMETHAMINE 10 MG PO TABS: 10.0000 mg | ORAL_TABLET | Freq: Four times a day (QID) | ORAL | 0 refills | Status: DC | PRN

## 2019-07-16 MED ORDER — KETOROLAC TROMETHAMINE 30 MG/ML IJ SOLN
30.0000 mg | Freq: Once | INTRAMUSCULAR | Status: AC
  Administered 2019-07-16: 30 mg via INTRAMUSCULAR
  Filled 2019-07-16: qty 1

## 2019-07-16 MED ORDER — LIDOCAINE 5 % EX PTCH: 1.0000 | MEDICATED_PATCH | CUTANEOUS | 0 refills | Status: DC

## 2019-07-16 MED ORDER — LIDOCAINE 5 % EX PTCH
1.0000 | MEDICATED_PATCH | CUTANEOUS | Status: DC
  Administered 2019-07-16: 1 via TRANSDERMAL
  Filled 2019-07-16: qty 1

## 2019-07-16 MED ORDER — BACLOFEN 5 MG PO TABS: 5.0000 mg | ORAL_TABLET | Freq: Three times a day (TID) | ORAL | 0 refills | Status: DC | PRN

## 2019-07-16 MED ORDER — METHYLPREDNISOLONE SODIUM SUCC 125 MG IJ SOLR
125.0000 mg | Freq: Once | INTRAMUSCULAR | Status: AC
  Administered 2019-07-16: 125 mg via INTRAMUSCULAR
  Filled 2019-07-16: qty 2

## 2019-07-16 NOTE — ED Provider Notes (Signed)
[facility] Emergency Department Provider Note  ____________________________________________  Time seen: Approximately 4:59 PM  I have reviewed the triage vital signs and the nursing notes.   HISTORY  Chief Complaint Back Pain    HPI Dana Livingston is a 49 y.o. female that presents to the emergency department for evaluation of low back pain that radiates into right leg for 1 week.  Patient states that her back was spasming over the weekend.  On Monday she turned in the car and felt the whole right side of her back tighten up.  Now she has some numbness to the inside of her distal right thigh and the outside of her thigh.  It has "not seem to affect the function of her right leg." She is walking.  No bowel or bladder incontinence or saddle anesthesias.  She currently rates her pain as a 4 out of 10.  Pain has been keeping her up at night.  Her primary care called her in some meloxicam and Flexeril.  The meloxicam did not help and the Flexeril made her feel bad so she discontinued these.  Her primary care then called in some steroids but the steroids were not at the pharmacy when her husband went to pick them up.  She read online that she may have a herniated disc and got concerned so she came to the emergency department.   Past Medical History:  Diagnosis Date  . Anxiety   . Complication of anesthesia 1989   for wisdom teeth extracted in office, could not get her under enough so had to do at hospital  . Fibroids   . Hypertension   . 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 2001   normal pap smears since 2002  . Panic attacks     Patient Active Problem List   Diagnosis Date Noted  . 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 Obesity 03/26/2017  . Hypertension   . Anxiety   . Panic attacks   . 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 Elevated cancer antigen 125 (CA-125) 01/18/2015  . Fibroids 01/18/2015    Past Surgical History:   Procedure Laterality Date  . CHOLECYSTECTOMY  1991  . LAPAROSCOPIC OVARIAN CYSTECTOMY Left 1998  . LASIK  2001  . ROBOTIC ASSISTED LAPAROSCOPIC OVARIAN CYSTECTOMY Left 02/14/2015   Procedure: ROBOTIC ASSISTED DRAINAGE LEFT OVARIAN CYST; FULAGRATION ON PERITONEAL ENDOMETRIOSIS;  Surgeon: [name], MD;  Location: [facility];  Service: Gynecology;  Laterality: Left;  . ROBOTIC ASSISTED SALPINGO OOPHERECTOMY Right 02/14/2015   Procedure: ROBOTIC ASSISTED RIGHT SALPINGO OOPHORECTOMY;  Surgeon: [name], MD;  Location: [facility];  Service: Gynecology;  Laterality: Right;  . TONSILLECTOMY  1981  . WISDOM TOOTH EXTRACTION      Prior to Admission medications   Medication Sig Start Date End Date Taking? Authorizing Provider  albuterol (PROVENTIL HFA;VENTOLIN HFA) 108 (90 Base) MCG/ACT inhaler Inhale 2 puffs into the lungs every 6 (six) hours as needed for wheezing or shortness of breath. Patient not taking: Reported on 09/09/2018 05/16/18   Trinna Post, PA-C  ALPRAZolam Duanne Moron) 0.25 MG tablet TAKE 1 TABLET(0.25 MG) BY MOUTH AT BEDTIME AS NEEDED FOR ANXIETY 03/11/19   Virginia Crews, MD  amLODipine (NORVASC) 5 MG tablet Take 1 tablet (5 mg total) by mouth daily. 03/11/19   Bacigalupo, Dionne Bucy, MD  Baclofen 5 MG TABS Take 5 mg by mouth 3 (three) times daily as needed. 07/16/19   Laban Emperor, PA-C  Chaste Tree (  VITEX EXTRACT PO) Take 2 capsules by mouth daily.    [provider]  Cholecalciferol (VITAMIN D) 2000 units CAPS Take 1 capsule by mouth daily.    [provider]  Cyanocobalamin (B-12 SL) Place under the tongue.    [provider]  cyclobenzaprine (FLEXERIL) 10 MG tablet Take 1 tablet (10 mg total) by mouth 3 (three) times daily as needed for muscle spasms. 07/14/19   Trinna Post, PA-C  IRON PO Take by mouth.    [provider]  ketorolac (TORADOL) 10 MG tablet Take 1 tablet (10 mg total) by mouth every 6 (six) hours as needed. 07/16/19   Laban Emperor, PA-C  lidocaine (LIDODERM) 5 % Place 1 patch onto the skin daily. Remove & Discard patch within 12 hours or as directed by MD 07/16/19   Laban Emperor, PA-C  meloxicam (MOBIC) 15 MG tablet TAKE 1 TABLET(15 MG) BY MOUTH DAILY 07/14/19   Fenton Malling M, PA-C  methylPREDNISolone (MEDROL) 4 MG TBPK tablet 6 day taper; take as directed on package instructions 07/15/19   Mar Daring, PA-C  Multiple Vitamin (MULTIVITAMIN) capsule Take 1 capsule by mouth daily.    [provider]  PASSION FLOWER-VALERIAN PO Take by mouth.    [provider]  predniSONE (DELTASONE) 10 MG tablet Take 6 tablets on day 1, take 5 tablets on day 2, take 4 tablets on day 3, take 3 tablets on day 4, take 2 tablets on day 5, take 1 tablet on day 6 07/16/19   Laban Emperor, PA-C  TURMERIC PO Take 2 tablets by mouth daily.    [provider]    Allergies Ciprofloxacin, Tetracyclines & related, and Other  Family History  Problem Relation Age of Onset  . Thyroid disease Mother   . Hypertension Mother   . COPD Father        former smoker  . Hypertension Father   . Diabetes Father   . Heart failure Father        former smoker   . Lung cancer Maternal Grandmother   . COPD Maternal Grandfather   . Colon cancer Paternal Uncle   . Breast cancer Neg Hx   . Ovarian cancer Neg Hx   . Cervical cancer Neg Hx     Social History Social History   Tobacco Use  . Smoking status: Never Smoker  . Smokeless tobacco: Never Used  Substance Use Topics  . Alcohol use: No  . Drug use: No     Review of Systems  Cardiovascular: No chest pain. Respiratory: No SOB. Gastrointestinal: No abdominal pain.  No nausea, no vomiting.  Genitourinary: Negative for dysuria. Musculoskeletal: Positive for back and leg pain. Skin: Negative for rash, abrasions, lacerations, ecchymosis. Neurological: Negative for headaches.  Positive for  numbness.   ____________________________________________   PHYSICAL EXAM:  VITAL SIGNS: ED Triage Vitals  Enc Vitals Group     BP 07/16/19 1554 (!) 171/85     Pulse Rate 07/16/19 1554 78     Resp 07/16/19 1554 18     Temp 07/16/19 1554 99.4 F (37.4 C)     Temp Source 07/16/19 1554 Oral     SpO2 07/16/19 1554 95 %     Weight 07/16/19 1554 195 lb (88.5 kg)     Height 07/16/19 1554 5\' 4"  (1.626 m)     Head Circumference --      Peak Flow --      Pain Score 07/16/19 1604 2  Pain Loc --      Pain Edu? --      Excl. in [place]? --      Constitutional: Alert and oriented. Well appearing and in no acute distress. Eyes: Conjunctivae are normal. PERRL. EOMI. Head: Atraumatic. ENT:      Ears:      Nose: No congestion/rhinnorhea.      Mouth/Throat: Mucous membranes are moist.  Neck: No stridor.  Cardiovascular: Normal rate, regular rhythm.  Good peripheral circulation. Respiratory: Normal respiratory effort without tachypnea or retractions. Lungs CTAB. Good air entry to the bases with no decreased or absent breath sounds. Gastrointestinal: Bowel sounds 4 quadrants. Soft and nontender to palpation. No guarding or rigidity. No palpable masses. No distention. Musculoskeletal: Full range of motion to all extremities. No gross deformities appreciated.  Tenderness to palpation to right lumbar paraspinal muscles.  Strength equal in lower extremities bilaterally. Normal gait. Full ROM of toes. Neurologic:  Normal speech and language. No gross focal neurologic deficits are appreciated. Patient reports change in sensation to medial distal thigh and lateral thigh. Skin:  Skin is warm, dry and intact. No rash noted. Psychiatric: Mood and affect are normal. Speech and behavior are normal. Patient exhibits appropriate insight and judgement.   ____________________________________________   LABS (all labs ordered are listed, but only abnormal results are displayed)  Labs Reviewed  URINALYSIS,  COMPLETE (UACMP) WITH MICROSCOPIC - Abnormal; Notable for the following components:      Result Value   Color, Urine YELLOW (*)    APPearance CLEAR (*)    Hgb urine dipstick SMALL (*)    Ketones, ur 5 (*)    Protein, ur 30 (*)    All other components within normal limits  POC URINE PREG, ED  POCT PREGNANCY, URINE   ____________________________________________  EKG   ____________________________________________  RADIOLOGY Robinette Haines, personally viewed and evaluated these images (plain radiographs) as part of my medical decision making, as well as reviewing the written report by the radiologist.  DG Lumbar Spine 2-3 Views  Result Date: 07/16/2019 CLINICAL DATA:  Pt complains of lower back more to right. Initially hurt when twisted wrong getting out of car Monday and has not been getting better. EXAM: LUMBAR SPINE - 2-3 VIEW COMPARISON:  None. FINDINGS: Normal alignment. Vertebral body heights and intervertebral disc spaces are maintained. No significant degenerative changes. No focal bony lesion. SI joints are open. Nonobstructive bowel gas pattern. IMPRESSION: Negative lumbar spine radiographs. Electronically Signed   By: Audie Pinto M.D.   On: 07/16/2019 18:35    ____________________________________________    PROCEDURES  Procedure(s) performed:    Procedures    Medications  orphenadrine (NORFLEX) injection 30 mg (30 mg Intramuscular Given 07/16/19 1853)  lidocaine (LIDODERM) 5 % 1 patch (1 patch Transdermal Patch Applied 07/16/19 1857)  methylPREDNISolone sodium succinate (SOLU-MEDROL) 125 mg/2 mL injection 125 mg (125 mg Intramuscular Given 07/16/19 1852)  ketorolac (TORADOL) 30 MG/ML injection 30 mg (30 mg Intramuscular Given 07/16/19 1853)     ____________________________________________   INITIAL IMPRESSION / ASSESSMENT AND PLAN / ED COURSE  Pertinent labs & imaging results that were available during my care of the patient were reviewed by me and considered  in my medical decision making (see chart for details).  Review of the [place] CSRS was performed in accordance of the [place] prior to dispensing any controlled drugs.   Patient presents to the emergency department for evaluation of low back pain for 1 week.  Vital signs and exam  are reassuring.  X-ray negative for acute bony abnormalities.  Pain improved with Toradol, Norflex, Solu-Medrol, Lidoderm.  Patient feels well.  Patient will be discharged home with prescriptions for Toradol, baclofen, Lidoderm, prednisone.  Patient is to follow up with neurosurgery or orthopedics as directed.  Referrals were given.  Patient is given ED precautions to return to the ED for any worsening or new symptoms.  Dana Livingston was evaluated in Emergency Department on 07/16/2019 for the symptoms described in the history of present illness. She was evaluated in the context of the global COVID-19 pandemic, which necessitated consideration that the patient might be at risk for infection with the SARS-CoV-2 virus that causes COVID-19. Institutional protocols and algorithms that pertain to the evaluation of patients at risk for COVID-19 are in a state of rapid change based on information released by regulatory bodies including the CDC and federal and state organizations. These policies and algorithms were followed during the patient's care in the ED.   ____________________________________________  FINAL CLINICAL IMPRESSION(S) / ED DIAGNOSES  Final diagnoses:  Acute right-sided low back pain, unspecified whether sciatica present  Lumbar radiculopathy      NEW MEDICATIONS STARTED DURING THIS VISIT:  ED Discharge Orders         Ordered    predniSONE (DELTASONE) 10 MG tablet     07/16/19 1917    lidocaine (LIDODERM) 5 %  Every 24 hours     07/16/19 1917    Baclofen 5 MG TABS  3 times daily PRN     07/16/19 1917    ketorolac (TORADOL) 10 MG tablet  Every 6 hours PRN     07/16/19 1917              This chart was  dictated using voice recognition software/Dragon. Despite best efforts to proofread, errors can occur which can change the meaning. Any change was purely unintentional.    Laban Emperor, PA-C 07/16/19 2323    Carrie Mew, MD 07/17/19 2249

## 2019-07-16 NOTE — ED Notes (Signed)
poct pregnancy negative

## 2019-07-16 NOTE — ED Triage Notes (Addendum)
Pt c/o lower back more to right. Initially hurt when twisted wrong getting out of car Monday and has not been getting better. PCP sent in meloxicam and muscle relaxer to pharmacy but reports muscle relaxer made her sick so didn't take. Took two doses of meloxicam and didn't help much so stopped taking.  Was sent in steroid but reports was at pharmacy but when she went to pick up it wasn't there and doctor office was closed. Has a tingling sensation down right leg from back. Pt appears anxious.  No loss bowel or bladder.  Able to ambulated from triage room to waiting room. Pt reports she googled her sx and is worried may have a herniated disc.

## 2019-07-16 NOTE — ED Notes (Signed)
First Nurse Note: pt to ED via POV for back pain that radiates into right leg. Pt states that she has some numbness in her right leg. Pt is in NAD.

## 2019-07-16 NOTE — Discharge Instructions (Addendum)
Your x-ray was normal.  You can use heat to your back tonight.  Begin medications tomorrow.  You can take Toradol and prednisone for pain and inflammation.  Take baclofen to relax your muscles.  Use Lidoderm patch on your back.  Discontinue the Mobic and the Flexeril from primary care.  Also do not pick up the steroids that primary care prescribed.  I have given you 2 referrals for your back.  You may make an appointment with either.

## 2019-07-17 ENCOUNTER — Emergency Department
Admission: EM | Admit: 2019-07-17 | Discharge: 2019-07-17 | Disposition: A | Discharge status: Home / Self Care | Payer: No Typology Code available for payment source | Attending: Emergency Medicine | Admitting: Emergency Medicine

## 2019-07-17 ENCOUNTER — Other Ambulatory Visit: Payer: Self-pay

## 2019-07-17 ENCOUNTER — Encounter: Payer: Self-pay | Admitting: Emergency Medicine

## 2019-07-17 DIAGNOSIS — Z79899 Other long term (current) drug therapy: Secondary | ICD-10-CM | POA: Diagnosis not present

## 2019-07-17 DIAGNOSIS — F419 Anxiety disorder, unspecified: Secondary | ICD-10-CM | POA: Diagnosis present

## 2019-07-17 DIAGNOSIS — Z711 Person with feared health complaint in whom no diagnosis is made: Secondary | ICD-10-CM

## 2019-07-17 DIAGNOSIS — I10 Essential (primary) hypertension: Secondary | ICD-10-CM | POA: Diagnosis not present

## 2019-07-17 MED ORDER — FAMOTIDINE 20 MG PO TABS
40.0000 mg | ORAL_TABLET | Freq: Once | ORAL | Status: AC
  Administered 2019-07-17: 40 mg via ORAL
  Filled 2019-07-17: qty 2

## 2019-07-17 MED ORDER — DIPHENHYDRAMINE HCL 25 MG PO CAPS
25.0000 mg | ORAL_CAPSULE | Freq: Once | ORAL | Status: AC
  Administered 2019-07-17: 17:00:00 25 mg via ORAL
  Filled 2019-07-17: qty 1

## 2019-07-17 NOTE — ED Notes (Signed)
Patient felt like she was somewhat short of breath and had an "odd sensation" to her skin after getting a shot for back pain yesterday.  Patient states back pain has improved and states allergic reaction, "seems to be stable'.  Patient is in no obvious distress at this time.  No obvious hives.

## 2019-07-17 NOTE — ED Triage Notes (Signed)
Patient arrived with stated sensation of SOB after taking injection yesterday here. States this am at 10am she took po meds and had same sensation. Patient is observed while waiting for triage. No resp distress. Hyperverbal without labored respirations.

## 2019-07-17 NOTE — ED Provider Notes (Signed)
Emergency Department Provider Note  ____________________________________________  Time seen: Approximately 4:54 PM  I have reviewed the triage vital signs and the nursing notes.   HISTORY  Chief Complaint possible medication reaction   Historian Patient    HPI Dana Livingston is a 49 y.o. female presents to the emergency department with concern for possible allergic reaction.  Patient was seen and evaluated for low back pain 1 day ago and received Solu-Medrol, Toradol and Norflex.  Patient reports that when she was discharged from the emergency department, she had a sensation of anxiousness. Patient reports that she has taken prednisone in the past and has had no complications.  She took baclofen, prednisone and oral Toradol this morning and states that she felt a similar sensation of anxiousness is when she received IM injections in the emergency department.  Patient denies rash, shortness of breath, chest tightness, chest pain, diarrhea, vomiting or syncope.  No prior history of anaphylaxis.  She takes anti-inflammatories without complication.  Patient reports that she has never taken Toradol in the past.   Past Medical History:  Diagnosis Date  . Anxiety   . Complication of anesthesia 1989   for wisdom teeth extracted in office, could not get her under enough so had to do at hospital  . Fibroids   . Hypertension   . 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 2001   normal pap smears since 2002  . Panic attacks      Immunizations up to date:  Yes.     Past Medical History:  Diagnosis Date  . Anxiety   . Complication of anesthesia 1989   for wisdom teeth extracted in office, could not get her under enough so had to do at hospital  . Fibroids   . Hypertension   . 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 2001   normal pap smears since 2002  . Panic attacks     Patient Active Problem List   Diagnosis Date  Noted  . 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 Obesity 03/26/2017  . Hypertension   . Anxiety   . Panic attacks   . 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 Elevated cancer antigen 125 (CA-125) 01/18/2015  . Fibroids 01/18/2015    Past Surgical History:  Procedure Laterality Date  . CHOLECYSTECTOMY  1991  . LAPAROSCOPIC OVARIAN CYSTECTOMY Left 1998  . LASIK  2001  . ROBOTIC ASSISTED LAPAROSCOPIC OVARIAN CYSTECTOMY Left 02/14/2015   Procedure: ROBOTIC ASSISTED DRAINAGE LEFT OVARIAN CYST; FULAGRATION ON PERITONEAL ENDOMETRIOSIS;  Surgeon: [name], MD;  Location: [facility];  Service: Gynecology;  Laterality: Left;  . ROBOTIC ASSISTED SALPINGO OOPHERECTOMY Right 02/14/2015   Procedure: ROBOTIC ASSISTED RIGHT SALPINGO OOPHORECTOMY;  Surgeon: [name], MD;  Location: [facility];  Service: Gynecology;  Laterality: Right;  . TONSILLECTOMY  1981  . WISDOM TOOTH EXTRACTION      Prior to Admission medications   Medication Sig Start Date End Date Taking? Authorizing Provider  albuterol (PROVENTIL HFA;VENTOLIN HFA) 108 (90 Base) MCG/ACT inhaler Inhale 2 puffs into the lungs every 6 (six) hours as needed for wheezing or shortness of breath. Patient not taking: Reported on 09/09/2018 05/16/18   Trinna Post, PA-C  ALPRAZolam Duanne Moron) 0.25 MG tablet TAKE 1 TABLET(0.25 MG) BY MOUTH AT BEDTIME AS NEEDED FOR ANXIETY 03/11/19   Virginia Crews, MD  amLODipine (NORVASC) 5 MG tablet Take 1 tablet (5 mg total) by mouth daily. 03/11/19   Virginia Crews,  MD  Baclofen 5 MG TABS Take 5 mg by mouth 3 (three) times daily as needed. 07/16/19   Laban Emperor, PA-C  Chaste Tree (VITEX EXTRACT PO) Take 2 capsules by mouth daily.    [provider]  Cholecalciferol (VITAMIN D) 2000 units CAPS Take 1 capsule by mouth daily.    [provider]  Cyanocobalamin (B-12 SL) Place under the tongue.    [provider]  cyclobenzaprine (FLEXERIL) 10 MG tablet Take 1 tablet (10 mg total) by mouth 3  (three) times daily as needed for muscle spasms. 07/14/19   Trinna Post, PA-C  IRON PO Take by mouth.    [provider]  ketorolac (TORADOL) 10 MG tablet Take 1 tablet (10 mg total) by mouth every 6 (six) hours as needed. 07/16/19   Laban Emperor, PA-C  lidocaine (LIDODERM) 5 % Place 1 patch onto the skin daily. Remove & Discard patch within 12 hours or as directed by MD 07/16/19   Laban Emperor, PA-C  meloxicam (MOBIC) 15 MG tablet TAKE 1 TABLET(15 MG) BY MOUTH DAILY 07/14/19   Fenton Malling M, PA-C  methylPREDNISolone (MEDROL) 4 MG TBPK tablet 6 day taper; take as directed on package instructions 07/15/19   Mar Daring, PA-C  Multiple Vitamin (MULTIVITAMIN) capsule Take 1 capsule by mouth daily.    [provider]  PASSION FLOWER-VALERIAN PO Take by mouth.    [provider]  predniSONE (DELTASONE) 10 MG tablet Take 6 tablets on day 1, take 5 tablets on day 2, take 4 tablets on day 3, take 3 tablets on day 4, take 2 tablets on day 5, take 1 tablet on day 6 07/16/19   Laban Emperor, PA-C  TURMERIC PO Take 2 tablets by mouth daily.    [provider]    Allergies Ciprofloxacin, Tetracyclines & related, and Other  Family History  Problem Relation Age of Onset  . Thyroid disease Mother   . Hypertension Mother   . COPD Father        former smoker  . Hypertension Father   . Diabetes Father   . Heart failure Father        former smoker   . Lung cancer Maternal Grandmother   . COPD Maternal Grandfather   . Colon cancer Paternal Uncle   . Breast cancer Neg Hx   . Ovarian cancer Neg Hx   . Cervical cancer Neg Hx     Social History Social History   Tobacco Use  . Smoking status: Never Smoker  . Smokeless tobacco: Never Used  Substance Use Topics  . Alcohol use: No  . Drug use: No     Review of Systems  Constitutional: No fever/chills Eyes:  No discharge ENT: No upper respiratory complaints. Respiratory: no cough. No SOB/  use of accessory muscles to breath Gastrointestinal:   No nausea, no vomiting.  No diarrhea.  No constipation. Skin: Negative for rash, abrasions, lacerations, ecchymosis.    ____________________________________________   PHYSICAL EXAM:  VITAL SIGNS: ED Triage Vitals  Enc Vitals Group     BP 07/17/19 1442 (!) 161/88     Pulse Rate 07/17/19 1442 77     Resp 07/17/19 1442 20     Temp 07/17/19 1442 98.8 F (37.1 C)     Temp Source 07/17/19 1442 Oral     SpO2 07/17/19 1442 96 %     Weight 07/17/19 1445 195 lb (88.5 kg)     Height 07/17/19 1445 5\' 4"  (  1.626 m)     Head Circumference --      Peak Flow --      Pain Score 07/17/19 1445 2     Pain Loc --      Pain Edu? --      Excl. in [place]? --      Constitutional: Alert and oriented. Well appearing and in no acute distress. Eyes: Conjunctivae are normal. PERRL. EOMI. Head: Atraumatic. ENT:      Nose: No congestion/rhinnorhea.      Mouth/Throat: Mucous membranes are moist.  Neck: No stridor.  No cervical spine tenderness to palpation. Cardiovascular: Normal rate, regular rhythm. Normal S1 and S2.  Good peripheral circulation. Respiratory: Normal respiratory effort without tachypnea or retractions. Lungs CTAB. Good air entry to the bases with no decreased or absent breath sounds Gastrointestinal: Bowel sounds x 4 quadrants. Soft and nontender to palpation. No guarding or rigidity. No distention. Musculoskeletal: Full range of motion to all extremities. No obvious deformities noted Neurologic:  Normal for age. No gross focal neurologic deficits are appreciated.  Skin:  Skin is warm, dry and intact. No rash noted. Psychiatric: Mood and affect are normal for age. Speech and behavior are normal.   ____________________________________________   LABS (all labs ordered are listed, but only abnormal results are displayed)  Labs Reviewed - No data to  display ____________________________________________  EKG   ____________________________________________  RADIOLOGY Unk Pinto, personally viewed and evaluated these images (plain radiographs) as part of my medical decision making, as well as reviewing the written report by the radiologist.  DG Lumbar Spine 2-3 Views  Result Date: 07/16/2019 CLINICAL DATA:  Pt complains of lower back more to right. Initially hurt when twisted wrong getting out of car Monday and has not been getting better. EXAM: LUMBAR SPINE - 2-3 VIEW COMPARISON:  None. FINDINGS: Normal alignment. Vertebral body heights and intervertebral disc spaces are maintained. No significant degenerative changes. No focal bony lesion. SI joints are open. Nonobstructive bowel gas pattern. IMPRESSION: Negative lumbar spine radiographs. Electronically Signed   By: Audie Pinto M.D.   On: 07/16/2019 18:35    ____________________________________________    PROCEDURES  Procedure(s) performed:     Procedures     Medications  famotidine (PEPCID) tablet 40 mg (40 mg Oral Given 07/17/19 1658)  diphenhydrAMINE (BENADRYL) capsule 25 mg (25 mg Oral Given 07/17/19 1658)     ____________________________________________   INITIAL IMPRESSION / ASSESSMENT AND PLAN / ED COURSE  Pertinent labs & imaging results that were available during my care of the patient were reviewed by me and considered in my medical decision making (see chart for details).    Assessment and Plan: Feared complaint without diagnosis 49 year old female presents to the emergency department with a sensation of anxiousness after receiving and taking IM injections in the emergency department and oral medications at home.  Patient was hypertensive at triage but vital signs were reassuring.  On physical exam, patient had pressured speech but had no increased work of breathing.  No urticaria was visualized.  Patient denied throat tightness, chest pain, chest  tightness, shortness of breath, diarrhea, vomiting or syncope that would suggest anaphylaxis.  Patient was given famotidine and Benadryl in the emergency department and will reassess.  Advise discontinuing Toradol as patient has not taken medication in the past and other medications have been tolerated given her history.  Advised continuing Benadryl and Pepcid at home for the next 2 to 3 days.  Advised continuing prednisone and baclofen  as patient reports that her back pain has improved significantly.    ____________________________________________  FINAL CLINICAL IMPRESSION(S) / ED DIAGNOSES  Final diagnoses:  Feared complaint without diagnosis      NEW MEDICATIONS STARTED DURING THIS VISIT:  ED Discharge Orders    None          This chart was dictated using voice recognition software/Dragon. Despite best efforts to proofread, errors can occur which can change the meaning. Any change was purely unintentional.      Karren Cobble 07/17/19 1805    Carrie Mew, MD 07/17/19 2248

## 2019-07-17 NOTE — Discharge Instructions (Signed)
Take 25 mg of Benadryl 3 times daily for the next 3 days. Take 20 mg of famotidine twice daily for the next 3 days. Return to the emergency department with new or worsening symptoms.

## 2019-07-20 ENCOUNTER — Telehealth: Payer: Self-pay | Admitting: Family Medicine

## 2019-07-20 NOTE — Telephone Encounter (Signed)
Received paperwork (4 pages) thru fax from [employer]. Put in Dr. Nancy Nordmann box.  Thanks, [employer]

## 2019-07-21 ENCOUNTER — Ambulatory Visit (INDEPENDENT_AMBULATORY_CARE_PROVIDER_SITE_OTHER): Payer: No Typology Code available for payment source | Admitting: Family Medicine

## 2019-07-21 ENCOUNTER — Encounter: Payer: Self-pay | Admitting: Family Medicine

## 2019-07-21 VITALS — BP 158/99 | Temp 98.5°F

## 2019-07-21 DIAGNOSIS — M5441 Lumbago with sciatica, right side: Secondary | ICD-10-CM

## 2019-07-21 MED ORDER — BACLOFEN 5 MG PO TABS: 5.0000 mg | ORAL_TABLET | Freq: Three times a day (TID) | ORAL | 0 refills | Status: DC | PRN

## 2019-07-21 NOTE — Patient Instructions (Signed)

## 2019-07-21 NOTE — Progress Notes (Signed)
Patient: Dana Livingston Female    DOB: 1970/07/22   49 y.o.   MRN: [id number] Visit Date: 07/21/2019  Today's Provider: Lavon Paganini, MD   Chief Complaint  Patient presents with  . Back Pain   Subjective:    I [place] S. Dimas, CMA, am acting as scribe for Lavon Paganini, MD. Virtual Visit via Telephone Note  I connected with Raiford Noble on 07/21/19 at  4:00 PM EST by telephone and verified that I am speaking with the correct person using two identifiers.  Location: Patient location: home Provider location: [facility] Persons involved in the visit: patient, provider    I discussed the limitations, risks, security and privacy concerns of performing an evaluation and management service by telephone and the availability of in person appointments. I also discussed with the patient that there may be a patient responsible charge related to this service. The patient expressed understanding and agreed to proceed.  HPI  Follow up ER visit  Patient was seen in ER for back pain on 07/16/2019. She was treated for acute right sided low back pain. Treatment for this included x-ray. She reports fair compliance with treatment. She reports this condition is Improved.  Patient reports returning to the ER due to reaction to Toradol, pt reports shortness of breath and swelling in throat. Patient reports she was given Benadryl and was sent home. Patient reports good compliance and tolerance with Baclofen, Lidocaine, Meloxicam and prednisone 10mg  6 day taper.   Back pain started on 12/28 when getting out of her car.  Felt like it seized up.  Spent the next several days in pain.  Tried Meloxicam and Flexeril after visit with Adriana with no relief.  Developed leg numbness, so went to ED on 07/16/19.  Was treated with Toradol, Norflex, Solu-medrol, and lidoderm with pain relief.  Advised to f/u with Neurosurgery.  Developed some airway tightness on Toradol, Baclofen, and  prednisone.  She was seen back in ED and was doing better, so Toradol was stopped and she was discharged.    She has continued to take prednisone, baclofen and mobic since then without any recurrence of airway issues.  Pain is now annoying and not agonizing.  Seems to be improving.  Numbness has lessened to surface of R thigh.  She has been able to walk around.  She is not sure if she needs to follow-up with neurosurgery as was previously scheduled.  She is also wondering about returning to work. ------------------------------------------------------------------------------------   Allergies  Allergen Reactions  . Ciprofloxacin Palpitations  . Toradol [Ketorolac Tromethamine] Anaphylaxis, Shortness Of Breath and Swelling  . Tetracyclines & Related Nausea And Vomiting  . Other Other (See Comments)    Cats cause sneezing     Current Outpatient Medications:  .  ALPRAZolam (XANAX) 0.25 MG tablet, TAKE 1 TABLET(0.25 MG) BY MOUTH AT BEDTIME AS NEEDED FOR ANXIETY, Disp: 30 tablet, Rfl: 1 .  amLODipine (NORVASC) 5 MG tablet, Take 1 tablet (5 mg total) by mouth daily., Disp: 90 tablet, Rfl: 3 .  Baclofen 5 MG TABS, Take 5 mg by mouth 3 (three) times daily as needed., Disp: 15 tablet, Rfl: 0 .  Chaste Tree (VITEX EXTRACT PO), Take 2 capsules by mouth daily., Disp: , Rfl:  .  Cholecalciferol (VITAMIN D) 2000 units CAPS, Take 1 capsule by mouth daily., Disp: , Rfl:  .  Cyanocobalamin (B-12 SL), Place under the tongue., Disp: , Rfl:  .  IRON PO, Take by mouth.,  Disp: , Rfl:  .  lidocaine (LIDODERM) 5 %, Place 1 patch onto the skin daily. Remove & Discard patch within 12 hours or as directed by MD, Disp: 30 patch, Rfl: 0 .  meloxicam (MOBIC) 15 MG tablet, TAKE 1 TABLET(15 MG) BY MOUTH DAILY, Disp: 90 tablet, Rfl: 0 .  Multiple Vitamin (MULTIVITAMIN) capsule, Take 1 capsule by mouth daily., Disp: , Rfl:  .  PASSION FLOWER-VALERIAN PO, Take by mouth., Disp: , Rfl:  .  predniSONE (DELTASONE) 10 MG  tablet, Take 6 tablets on day 1, take 5 tablets on day 2, take 4 tablets on day 3, take 3 tablets on day 4, take 2 tablets on day 5, take 1 tablet on day 6, Disp: 21 tablet, Rfl: 0 .  TURMERIC PO, Take 2 tablets by mouth daily., Disp: , Rfl:   Review of Systems  Constitutional: Negative.   HENT: Negative.   Respiratory: Negative.   Cardiovascular: Negative.   Gastrointestinal: Negative.   Musculoskeletal: Positive for back pain, gait problem and myalgias. Negative for arthralgias, joint swelling, neck pain and neck stiffness.  Skin: Negative.   Neurological: Positive for numbness. Negative for dizziness, tremors, facial asymmetry, weakness and light-headedness.  Psychiatric/Behavioral: Negative.     Social History   Tobacco Use  . Smoking status: Never Smoker  . Smokeless tobacco: Never Used  Substance Use Topics  . Alcohol use: No      Objective:   BP (!) 158/99 (BP Location: Right Arm, Patient Position: Sitting, Cuff Size: Normal)   Temp 98.5 F (36.9 C) (Oral)  Vitals:   07/21/19 1404  BP: (!) 158/99  Temp: 98.5 F (36.9 C)  TempSrc: Oral  There is no height or weight on file to calculate BMI.   Physical Exam Speaking in full sentences in no apparent distress  No results found for any visits on 07/21/19.     Assessment & Plan    I discussed the assessment and treatment plan with the patient. The patient was provided an opportunity to ask questions and all were answered. The patient agreed with the plan and demonstrated an understanding of the instructions.   The patient was advised to call back or seek an in-person evaluation if the symptoms worsen or if the condition fails to improve as anticipated.  I provided 35 minutes of non-face-to-face time during this encounter.   1. Acute right-sided low back pain with right-sided sciatica -New problem -Seems to be improving since her ER visit -Discussed the natural course and self-limited nature of most low back  pain in adults -She has had negative imaging, which is reassuring -Her numbness is nearly gone and she has no weakness or other neurologic symptoms, so if this continues to improve, she likely does not need to follow-up with neurosurgery -Can continue Mobic, baclofen, and finished prednisone taper -Advised on gentle back stretching exercises and core exercises to do after she is doing better -Advised to remain active and continue walking -She will try to return to work on 07/26/2019, but if symptoms worsen or do not continue to get better, this may need to be extended further -Discussed strict return precautions   Meds ordered this encounter  Medications  . Baclofen 5 MG TABS    Sig: Take 5 mg by mouth 3 (three) times daily as needed.    Dispense:  15 tablet    Refill:  0     Return if symptoms worsen or fail to improve.   The entirety of the  information documented in the History of Present Illness, Review of Systems and Physical Exam were personally obtained by me. Portions of this information were initially documented by Lynford Humphrey, CMA and reviewed by me for thoroughness and accuracy.    Jeven Topper, Dionne Bucy, MD MPH [facility]

## 2019-07-22 ENCOUNTER — Telehealth: Payer: Self-pay | Admitting: Family Medicine

## 2019-07-22 NOTE — Telephone Encounter (Signed)
Patient called and would like a doctor's note for work from Dr. Brita Romp stating that she can return back to work on Monday the 11th. If it can please be put in her mychart that way she can print it out and take it to work. If any questions please contact patient, thanks.

## 2019-07-22 NOTE — Telephone Encounter (Signed)
Letter sent through mychart.

## 2019-07-23 ENCOUNTER — Telehealth: Payer: Self-pay | Admitting: Family Medicine

## 2019-07-23 NOTE — Telephone Encounter (Signed)
Pt had a vitual  appt with dr b on 07-21-2019 for back pain and FMLA was completed and pt suppose to return back to work on 07-26-2019. Pt would like to talk with dr b. Pt is unable to sleep and still have back pain

## 2019-07-24 ENCOUNTER — Encounter: Payer: Self-pay | Admitting: Family Medicine

## 2019-07-26 NOTE — Telephone Encounter (Signed)
Patient reports she is feeling better, was able to sleep last night. Patient is back at work today.

## 2019-07-26 NOTE — Telephone Encounter (Signed)
Did not get this message until this morning.  Can you please call the patient and see what is going on?  Does she need to stay out of work longer or just think she needs a medication change?

## 2019-08-16 ENCOUNTER — Other Ambulatory Visit: Payer: Self-pay | Admitting: Family Medicine

## 2019-08-16 MED ORDER — ALPRAZOLAM 0.25 MG PO TABS: ORAL_TABLET | ORAL | 1 refills | Status: DC

## 2019-08-16 NOTE — Telephone Encounter (Signed)
[place] faxed refill request for the following medications:  ALPRAZolam (XANAX) 0.25 MG tablet   Please advise.  Thanks, [employer]

## 2020-02-02 ENCOUNTER — Telehealth: Payer: Self-pay

## 2020-02-02 ENCOUNTER — Other Ambulatory Visit: Payer: Self-pay | Admitting: Family Medicine

## 2020-02-02 NOTE — Telephone Encounter (Signed)
Copied from [place] [phone] [id number]. Topic: General - Other >> Feb 02, 2020  9:36 AM Leward Quan A wrote: Reason for CRM: Patient called to reschedule her appointment for her Physical Dr B has nothing until 06/19/20 she want to know if it is ok to schedule the physical with Laverna Peace and this is needed by the end of August. Please respond to patient on her My Chart call patient at Ph# [phone]

## 2020-02-02 NOTE — Telephone Encounter (Signed)
Requested medication (s) are due for refill today: Yes  Requested medication (s) are on the active medication list: Yes  Last refill:  08/16/19  Future visit scheduled: Yes  Notes to clinic:  Unable to refill per protocol cannot delgate     Requested Prescriptions  Pending Prescriptions Disp Refills   ALPRAZolam (XANAX) 0.25 MG tablet [Pharmacy Med Name: ALPRAZOLAM 0.25MG  TABLETS] 30 tablet     Sig: TAKE 1 TABLET(0.25 MG) BY MOUTH AT BEDTIME AS NEEDED FOR ANXIETY      Not Delegated - Psychiatry:  Anxiolytics/Hypnotics Failed - 02/02/2020  5:02 PM      Failed - This refill cannot be delegated      Failed - Urine Drug Screen completed in last 360 days.      Failed - Valid encounter within last 6 months    Recent Outpatient Visits           6 months ago Acute right-sided low back pain with right-sided sciatica   [facility] [place], Dionne Bucy, MD   6 months ago Acute bilateral low back pain without sciatica   [facility] [place] [facility], [place]   10 months ago Encounter for annual physical exam   [facility] [place], Dionne Bucy, MD   1 year ago Essential hypertension   [facility] [place], Dionne Bucy, MD   1 year ago [place] [place], [place]       Future Appointments             In 1 month Bacigalupo, Dionne Bucy, MD [facility], [place]

## 2020-02-03 NOTE — Telephone Encounter (Signed)
Scheduled patient appt for 02/26/20 at [facility]

## 2020-02-03 NOTE — Telephone Encounter (Signed)
Ok in available slot.

## 2020-02-03 NOTE — Telephone Encounter (Signed)
Okay to reschedule for CPE on our schedule correct? KW

## 2020-02-09 LAB — LIPID PANEL
Cholesterol: 178 (ref 0–200)
HDL: 45 (ref 35–70)
LDL Cholesterol: 118
Triglycerides: 80 (ref 40–160)

## 2020-02-09 LAB — HEMOGLOBIN A1C: Hemoglobin A1C: 5.4

## 2020-02-09 LAB — COMPREHENSIVE METABOLIC PANEL
GFR calc Af Amer: 103
GFR calc non Af Amer: 89

## 2020-02-09 LAB — BASIC METABOLIC PANEL
Creatinine: 0.8 (ref 0.5–1.1)
Glucose: 85

## 2020-03-01 ENCOUNTER — Encounter: Payer: Self-pay | Admitting: Adult Health

## 2020-03-13 ENCOUNTER — Ambulatory Visit (INDEPENDENT_AMBULATORY_CARE_PROVIDER_SITE_OTHER): Payer: No Typology Code available for payment source | Admitting: Family Medicine

## 2020-03-13 ENCOUNTER — Other Ambulatory Visit: Payer: Self-pay

## 2020-03-13 ENCOUNTER — Encounter: Payer: Self-pay | Admitting: Family Medicine

## 2020-03-13 VITALS — BP 138/83 | HR 71 | Temp 98.7°F | Resp 16 | Ht 64.0 in | Wt 194.8 lb

## 2020-03-13 DIAGNOSIS — Z1211 Encounter for screening for malignant neoplasm of colon: Secondary | ICD-10-CM

## 2020-03-13 DIAGNOSIS — I10 Essential (primary) hypertension: Secondary | ICD-10-CM | POA: Diagnosis not present

## 2020-03-13 DIAGNOSIS — Z6833 Body mass index (BMI) 33.0-33.9, adult: Secondary | ICD-10-CM

## 2020-03-13 DIAGNOSIS — Z114 Encounter for screening for human immunodeficiency virus [HIV]: Secondary | ICD-10-CM

## 2020-03-13 DIAGNOSIS — Z23 Encounter for immunization: Secondary | ICD-10-CM

## 2020-03-13 DIAGNOSIS — Z1159 Encounter for screening for other viral diseases: Secondary | ICD-10-CM

## 2020-03-13 DIAGNOSIS — Z0000 Encounter for general adult medical examination without abnormal findings: Secondary | ICD-10-CM | POA: Diagnosis not present

## 2020-03-13 DIAGNOSIS — Z1231 Encounter for screening mammogram for malignant neoplasm of breast: Secondary | ICD-10-CM

## 2020-03-13 DIAGNOSIS — E669 Obesity, unspecified: Secondary | ICD-10-CM | POA: Diagnosis not present

## 2020-03-13 NOTE — Progress Notes (Signed)
Complete physical exam   Patient: Dana Livingston   DOB: 1970-12-05   49 y.o. Female  MRN: [id number] Visit Date: 03/13/2020  Today's healthcare provider: Lavon Paganini, MD   I,Sulibeya S Dimas,acting as a scribe for Lavon Paganini, MD.,have documented all relevant documentation on the behalf of Lavon Paganini, MD,as directed by  Lavon Paganini, MD while in the presence of Lavon Paganini, MD.  Chief Complaint  Patient presents with  . Annual Exam   Subjective    Dana Livingston is a 49 y.o. female who presents today for a complete physical exam.  She reports consuming a general diet. The patient does not participate in regular exercise at present. She generally feels well. She reports sleeping well. She does have additional problems to discuss today.  HPI  01/18/2015 Pap  Past Medical History:  Diagnosis Date  . Anxiety   . Complication of anesthesia 1989   for wisdom teeth extracted in office, could not get her under enough so had to do at hospital  . Fibroids   . Hypertension   . 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 2001   normal pap smears since 2002  . Panic attacks    Past Surgical History:  Procedure Laterality Date  . CHOLECYSTECTOMY  1991  . LAPAROSCOPIC OVARIAN CYSTECTOMY Left 1998  . LASIK  2001  . ROBOTIC ASSISTED LAPAROSCOPIC OVARIAN CYSTECTOMY Left 02/14/2015   Procedure: ROBOTIC ASSISTED DRAINAGE LEFT OVARIAN CYST; FULAGRATION ON PERITONEAL ENDOMETRIOSIS;  Surgeon: [name], MD;  Location: [facility];  Service: Gynecology;  Laterality: Left;  . ROBOTIC ASSISTED SALPINGO OOPHERECTOMY Right 02/14/2015   Procedure: ROBOTIC ASSISTED RIGHT SALPINGO OOPHORECTOMY;  Surgeon: [name], MD;  Location: [facility];  Service: Gynecology;  Laterality: Right;  . TONSILLECTOMY  1981  . WISDOM TOOTH EXTRACTION     Social History   Socioeconomic History  . Marital status: Married    Spouse name: Grafton Folk  . Number  of children: 0  . Years of education: masters x 2  . Highest education level: Not on file  Occupational History    Employer: [employer]  Tobacco Use  . Smoking status: Never Smoker  . Smokeless tobacco: Never Used  Vaping Use  . Vaping Use: Never used  Substance and Sexual Activity  . Alcohol use: No  . Drug use: No  . Sexual activity: Yes    Birth control/protection: None  Other Topics Concern  . Not on file  Social History Narrative  . Not on file   Social Determinants of Health   Financial Resource Strain:   . Difficulty of Paying Living Expenses: Not on file  Food Insecurity:   . Worried About Charity fundraiser in the Last Year: Not on file  . Ran Out of Food in the Last Year: Not on file  Transportation Needs:   . Lack of Transportation (Medical): Not on file  . Lack of Transportation (Non-Medical): Not on file  Physical Activity:   . Days of Exercise per Week: Not on file  . Minutes of Exercise per Session: Not on file  Stress:   . Feeling of Stress : Not on file  Social Connections:   . Frequency of Communication with Friends and Family: Not on file  . Frequency of Social Gatherings with Friends and Family: Not on file  . Attends Religious Services: Not on file  . Active Member of Clubs or Organizations: Not on file  . Attends Club  or Organization Meetings: Not on file  . Marital Status: Not on file  Intimate Partner Violence:   . Fear of Current or Ex-Partner: Not on file  . Emotionally Abused: Not on file  . Physically Abused: Not on file  . Sexually Abused: Not on file   Family Status  Relation Name Status  . Mother  Alive  . Father  Alive  . MGM  Deceased  . MGF  Deceased  . Annamarie Major  (Not Specified)  . Brother  Alive  . Neg Hx  (Not Specified)   Family History  Problem Relation Age of Onset  . Thyroid disease Mother   . Hypertension Mother   . Stroke Mother   . Hyperlipidemia Mother        with atherosclerosis  . COPD Father        former  smoker  . Hypertension Father   . Heart failure Father        former smoker   . Lung cancer Maternal Grandmother   . COPD Maternal Grandfather   . Colon cancer Paternal Uncle   . Breast cancer Neg Hx   . Ovarian cancer Neg Hx   . Cervical cancer Neg Hx    Allergies  Allergen Reactions  . Ciprofloxacin Palpitations  . Toradol [Ketorolac Tromethamine] Anaphylaxis, Shortness Of Breath and Swelling  . Tetracyclines & Related Nausea And Vomiting  . Other Other (See Comments)    Cats cause sneezing    Patient Care Team: Virginia Crews, MD as PCP - General (Family Medicine)   Medications: Outpatient Medications Prior to Visit  Medication Sig  . ALPRAZolam (XANAX) 0.25 MG tablet TAKE 1 TABLET(0.25 MG) BY MOUTH AT BEDTIME AS NEEDED FOR ANXIETY  . amLODipine (NORVASC) 5 MG tablet Take 1 tablet (5 mg total) by mouth daily.  . ASHWAGANDHA PO Take 2 capsules by mouth daily. 1300 mg daily  . Chaste Tree (VITEX EXTRACT PO) Take 2 capsules by mouth daily.   . Cholecalciferol (VITAMIN D) 2000 units CAPS Take 1 capsule by mouth daily.   . Cyanocobalamin (B-12 SL) Place under the tongue.   Marland Kitchen doxylamine, Sleep, (UNISOM) 25 MG tablet Take 25 mg by mouth at bedtime as needed.  . Multiple Vitamin (MULTIVITAMIN) capsule Take 1 capsule by mouth daily.   . Omega-3 Fatty Acids (OMEGA 3 500) 500 MG CAPS Take 525 mg by mouth. VEGAN  . PASSION FLOWER-VALERIAN PO Take by mouth.   . Selenium 200 MCG CAPS Take 1 capsule by mouth every other day.  . TURMERIC PO Take 2 tablets by mouth daily.   . IRON PO Take by mouth. (Patient not taking: Reported on 03/13/2020)  . [DISCONTINUED] Baclofen 5 MG TABS Take 5 mg by mouth 3 (three) times daily as needed.  . [DISCONTINUED] lidocaine (LIDODERM) 5 % Place 1 patch onto the skin daily. Remove & Discard patch within 12 hours or as directed by MD  . [DISCONTINUED] meloxicam (MOBIC) 15 MG tablet TAKE 1 TABLET(15 MG) BY MOUTH DAILY  . [DISCONTINUED] predniSONE  (DELTASONE) 10 MG tablet Take 6 tablets on day 1, take 5 tablets on day 2, take 4 tablets on day 3, take 3 tablets on day 4, take 2 tablets on day 5, take 1 tablet on day 6   No facility-administered medications prior to visit.    Review of Systems  Constitutional: Negative.   HENT: Negative.   Eyes: Negative.   Respiratory: Negative.   Cardiovascular: Negative.   Gastrointestinal: Negative.  Endocrine: Negative.   Genitourinary: Negative.   Musculoskeletal: Negative.   Skin: Negative.   Allergic/Immunologic: Negative.   Neurological: Positive for light-headedness.  Hematological: Negative.   Psychiatric/Behavioral: The patient is nervous/anxious.     Last CBC Lab Results  Component Value Date   WBC 9.2 03/13/2020   HGB 12.6 03/13/2020   HCT 38.0 03/13/2020   MCV 93 03/13/2020   MCH 30.8 03/13/2020   RDW 12.2 03/13/2020   PLT 355 62/26/3335   Last metabolic panel Lab Results  Component Value Date   GLUCOSE 89 09/09/2018   NA 138 09/09/2018   K 4.2 09/09/2018   CL 101 09/09/2018   CO2 21 09/09/2018   BUN 8 09/09/2018   CREATININE 0.8 02/09/2020   GFRNONAA 89 02/09/2020   GFRAA 103 02/09/2020   CALCIUM 9.3 09/09/2018   PROT 7.3 02/09/2015   ALBUMIN 4.1 02/09/2015   BILITOT 0.4 02/09/2015   ALKPHOS 77 02/09/2015   AST 19 02/09/2015   ALT 12 (L) 02/09/2015   ANIONGAP 8 02/13/2017   Last lipids Lab Results  Component Value Date   CHOL 178 02/09/2020   HDL 45 02/09/2020   LDLCALC 118 02/09/2020   TRIG 80 02/09/2020   Last hemoglobin A1c Lab Results  Component Value Date   HGBA1C 5.4 02/09/2020      Objective    BP 138/83 (BP Location: Left Arm, Patient Position: Sitting, Cuff Size: Large)   Pulse 71   Temp 98.7 F (37.1 C) (Oral)   Resp 16   Ht 5\' 4"  (1.626 m)   Wt 194 lb 12.8 oz (88.4 kg)   BMI 33.44 kg/m  BP Readings from Last 3 Encounters:  03/13/20 138/83  07/21/19 (!) 158/99  07/17/19 (!) 161/88   Wt Readings from Last 3 Encounters:    03/13/20 194 lb 12.8 oz (88.4 kg)  07/17/19 195 lb (88.5 kg)  07/16/19 195 lb (88.5 kg)      Physical Exam Vitals reviewed.  Constitutional:      General: She is not in acute distress.    Appearance: Normal appearance. She is well-developed. She is not diaphoretic.  HENT:     Head: Normocephalic and atraumatic.     Right Ear: Tympanic membrane, ear canal and external ear normal.     Left Ear: Tympanic membrane, ear canal and external ear normal.     Nose: Nose normal.     Mouth/Throat:     Mouth: Mucous membranes are moist.     Pharynx: Oropharynx is clear. No oropharyngeal exudate.  Eyes:     General: No scleral icterus.    Conjunctiva/sclera: Conjunctivae normal.     Pupils: Pupils are equal, round, and reactive to light.  Neck:     Thyroid: No thyromegaly.  Cardiovascular:     Rate and Rhythm: Normal rate and regular rhythm.     Pulses: Normal pulses.     Heart sounds: Normal heart sounds. No murmur heard.   Pulmonary:     Effort: Pulmonary effort is normal. No respiratory distress.     Breath sounds: Normal breath sounds. No wheezing or rales.  Abdominal:     General: There is no distension.     Palpations: Abdomen is soft.     Tenderness: There is no abdominal tenderness.  Musculoskeletal:        General: No deformity.     Cervical back: Neck supple.     Right lower leg: No edema.     Left lower leg:  No edema.  Lymphadenopathy:     Cervical: No cervical adenopathy.  Skin:    General: Skin is warm and dry.     Findings: No rash.  Neurological:     Mental Status: She is alert and oriented to person, place, and time. Mental status is at baseline.     Sensory: No sensory deficit.     Motor: No weakness.     Gait: Gait normal.  Psychiatric:        Mood and Affect: Mood normal.        Behavior: Behavior normal.        Thought Content: Thought content normal.     Last depression screening scores PHQ 2/9 Scores 03/13/2020 03/11/2019 03/04/2018  PHQ - 2 Score 0  0 0  PHQ- 9 Score 0 0 -   Last fall risk screening Fall Risk  03/13/2020  Falls in the past year? 0  Number falls in past yr: 0  Injury with Fall? 0  Risk for fall due to : No Fall Risks  Follow up Falls evaluation completed   Last Audit-C alcohol use screening Alcohol Use Disorder Test (AUDIT) 03/13/2020  1. How often do you have a drink containing alcohol? 0  2. How many drinks containing alcohol do you have on a typical day when you are drinking? 0  3. How often do you have six or more drinks on one occasion? 0  AUDIT-C Score 0  Alcohol Brief Interventions/Follow-up AUDIT Score <7 follow-up not indicated   A score of 3 or more in women, and 4 or more in men indicates increased risk for alcohol abuse, EXCEPT if all of the points are from question 1   Results for orders placed or performed in visit on 10/62/69  Basic metabolic panel  Result Value Ref Range   Glucose 85    Creatinine 0.8 0.5 - 1.1  Comprehensive metabolic panel  Result Value Ref Range   GFR calc Af Amer 103    GFR calc non Af Amer 89   Lipid panel  Result Value Ref Range   Triglycerides 80 40 - 160   Cholesterol 178 0 - 200   HDL 45 35 - 70   LDL Cholesterol 118   Hemoglobin A1c  Result Value Ref Range   Hemoglobin A1C 5.4   HIV Antibody (routine testing w rflx)  Result Value Ref Range   HIV Screen 4th Generation wRfx Non Reactive Non Reactive  Hepatitis C antibody  Result Value Ref Range   Hep C Virus Ab <0.1 0.0 - 0.9 s/co ratio  CBC with Differential/Platelet  Result Value Ref Range   WBC 9.2 3.4 - 10.8 x10E3/uL   RBC 4.09 3.77 - 5.28 x10E6/uL   Hemoglobin 12.6 11.1 - 15.9 g/dL   Hematocrit 38.0 34.0 - 46.6 %   MCV 93 79 - 97 fL   MCH 30.8 26.6 - 33.0 pg   MCHC 33.2 31 - 35 g/dL   RDW 12.2 11.7 - 15.4 %   Platelets 355 150 - 450 x10E3/uL   Neutrophils 62 Not Estab. %   Lymphs 25 Not Estab. %   Monocytes 9 Not Estab. %   Eos 3 Not Estab. %   Basos 1 Not Estab. %   Neutrophils Absolute 5.8  1 - 7 x10E3/uL   Lymphocytes Absolute 2.3 0 - 3 x10E3/uL   Monocytes Absolute 0.8 0 - 0 x10E3/uL   EOS (ABSOLUTE) 0.3 0.0 - 0.4 x10E3/uL   Basophils Absolute  0.1 0 - 0 x10E3/uL   Immature Granulocytes 0 Not Estab. %   Immature Grans (Abs) 0.0 0.0 - 0.1 x10E3/uL  VITAMIN D 25 Hydroxy (Vit-D Deficiency, Fractures)  Result Value Ref Range   Vit D, 25-Hydroxy 26.4 (L) 30.0 - 100.0 ng/mL  Vitamin B12  Result Value Ref Range   Vitamin B-12 1,602 (H) 232 - 1,245 pg/mL    Assessment & Plan    Routine Health Maintenance and Physical Exam  Exercise Activities and Dietary recommendations Goals   None     Immunization History  Administered Date(s) Administered  . Influenza,inj,Quad PF,6+ Mos 03/11/2019, 03/13/2020  . PFIZER SARS-COV-2 Vaccination 08/26/2019, 09/16/2019  . Tdap 03/04/2018    Health Maintenance  Topic Date Due  . MAMMOGRAM  Never done  . COLONOSCOPY  Never done  . PAP SMEAR-Modifier  01/18/2020  . TETANUS/TDAP  03/04/2028  . INFLUENZA VACCINE  Completed  . COVID-19 Vaccine  Completed  . Hepatitis C Screening  Completed  . HIV Screening  Completed    Discussed health benefits of physical activity, and encouraged her to engage in regular exercise appropriate for her age and condition.  Problem List Items Addressed This Visit      Cardiovascular and Mediastinum   Hypertension    Well controlled today Continue current medications Brought in Biometric results for [facility]-reviewed and abstracted F/u in 1 year         Other   Obesity    Discussed importance of healthy weight management Discussed diet and exercise        Other Visit Diagnoses    Encounter for annual physical exam    -  Primary   Relevant Orders   CBC with Differential/Platelet (Completed)   VITAMIN D 25 Hydroxy (Vit-D Deficiency, Fractures) (Completed)   Vitamin B12 (Completed)   Need for influenza vaccination       Relevant Orders   Flu Vaccine QUAD 36+ mos IM (Completed)    Screening for HIV (human immunodeficiency virus)       Relevant Orders   HIV Antibody (routine testing w rflx) (Completed)   Need for hepatitis C screening test       Relevant Orders   Hepatitis C antibody (Completed)   Encounter for screening mammogram for malignant neoplasm of breast       Relevant Orders   MM 3D SCREEN BREAST BILATERAL   Colon cancer screening       Relevant Orders   Ambulatory referral to Gastroenterology       Return in about 1 year (around 03/13/2021) for CPE.     I, Lavon Paganini, MD, have reviewed all documentation for this visit. The documentation on 03/14/20 for the exam, diagnosis, procedures, and orders are all accurate and complete.   Goldy Calandra, Dionne Bucy, MD, MPH [facility]

## 2020-03-13 NOTE — Patient Instructions (Signed)
Preventive Care 40-51 Years Old, Female Preventive care refers to visits with your health care provider and lifestyle choices that can promote health and wellness. This includes:  A yearly physical exam. This may also be called an annual well check.  Regular dental visits and eye exams.  Immunizations.  Screening for certain conditions.  Healthy lifestyle choices, such as eating a healthy diet, getting regular exercise, not using drugs or products that contain nicotine and tobacco, and limiting alcohol use. What can I expect for my preventive care visit? Physical exam Your health care provider will check your:  Height and weight. This may be used to calculate body mass index (BMI), which tells if you are at a healthy weight.  Heart rate and blood pressure.  Skin for abnormal spots. Counseling Your health care provider may ask you questions about your:  Alcohol, tobacco, and drug use.  Emotional well-being.  Home and relationship well-being.  Sexual activity.  Eating habits.  Work and work environment.  Method of birth control.  Menstrual cycle.  Pregnancy history. What immunizations do I need?  Influenza (flu) vaccine  This is recommended every year. Tetanus, diphtheria, and pertussis (Tdap) vaccine  You may need a Td booster every 10 years. Varicella (chickenpox) vaccine  You may need this if you have not been vaccinated. Zoster (shingles) vaccine  You may need this after age 60. Measles, mumps, and rubella (MMR) vaccine  You may need at least one dose of MMR if you were born in 1957 or later. You may also need a second dose. Pneumococcal conjugate (PCV13) vaccine  You may need this if you have certain conditions and were not previously vaccinated. Pneumococcal polysaccharide (PPSV23) vaccine  You may need one or two doses if you smoke cigarettes or if you have certain conditions. Meningococcal conjugate (MenACWY) vaccine  You may need this if you  have certain conditions. Hepatitis A vaccine  You may need this if you have certain conditions or if you travel or work in places where you may be exposed to hepatitis A. Hepatitis B vaccine  You may need this if you have certain conditions or if you travel or work in places where you may be exposed to hepatitis B. Haemophilus influenzae type b (Hib) vaccine  You may need this if you have certain conditions. Human papillomavirus (HPV) vaccine  If recommended by your health care provider, you may need three doses over 6 months. You may receive vaccines as individual doses or as more than one vaccine together in one shot (combination vaccines). Talk with your health care provider about the risks and benefits of combination vaccines. What tests do I need? Blood tests  Lipid and cholesterol levels. These may be checked every 5 years, or more frequently if you are over 50 years old.  Hepatitis C test.  Hepatitis B test. Screening  Lung cancer screening. You may have this screening every year starting at age 55 if you have a 30-pack-year history of smoking and currently smoke or have quit within the past 15 years.  Colorectal cancer screening. All adults should have this screening starting at age 50 and continuing until age 75. Your health care provider may recommend screening at age 45 if you are at increased risk. You will have tests every 1-10 years, depending on your results and the type of screening test.  Diabetes screening. This is done by checking your blood sugar (glucose) after you have not eaten for a while (fasting). You may have this   done every 1-3 years.  Mammogram. This may be done every 1-2 years. Talk with your health care provider about when you should start having regular mammograms. This may depend on whether you have a family history of breast cancer.  BRCA-related cancer screening. This may be done if you have a family history of breast, ovarian, tubal, or peritoneal  cancers.  Pelvic exam and Pap test. This may be done every 3 years starting at age 58. Starting at age 12, this may be done every 5 years if you have a Pap test in combination with an HPV test. Other tests  Sexually transmitted disease (STD) testing.  Bone density scan. This is done to screen for osteoporosis. You may have this scan if you are at high risk for osteoporosis. Follow these instructions at home: Eating and drinking  Eat a diet that includes fresh fruits and vegetables, whole grains, lean protein, and low-fat dairy.  Take vitamin and mineral supplements as recommended by your health care provider.  Do not drink alcohol if: ? Your health care provider tells you not to drink. ? You are pregnant, may be pregnant, or are planning to become pregnant.  If you drink alcohol: ? Limit how much you have to 0-1 drink a day. ? Be aware of how much alcohol is in your drink. In the [place] one drink equals one 12 oz bottle of beer (355 mL), one 5 oz glass of wine (148 mL), or one 1 oz glass of hard liquor (44 mL). Lifestyle  Take daily care of your teeth and gums.  Stay active. Exercise for at least 30 minutes on 5 or more days each week.  Do not use any products that contain nicotine or tobacco, such as cigarettes, e-cigarettes, and chewing tobacco. If you need help quitting, ask your health care provider.  If you are sexually active, practice safe sex. Use a condom or other form of birth control (contraception) in order to prevent pregnancy and STIs (sexually transmitted infections).  If told by your health care provider, take low-dose aspirin daily starting at age 89. What's next?  Visit your health care provider once a year for a well check visit.  Ask your health care provider how often you should have your eyes and teeth checked.  Stay up to date on all vaccines. This information is not intended to replace advice given to you by your health care provider. Make sure you  discuss any questions you have with your health care provider. Document Revised: 03/12/2018 Document Reviewed: 03/12/2018 Elsevier Patient Education  [place].  Influenza (Flu) Vaccine (Inactivated or Recombinant): What You Need to Know 1. Why get vaccinated? Influenza vaccine can prevent influenza (flu). Flu is a contagious disease that spreads around the [place] every year, usually between October and May. Anyone can get the flu, but it is more dangerous for some people. Infants and young children, people 46 years of age and older, pregnant women, and people with certain health conditions or a weakened immune system are at greatest risk of flu complications. Pneumonia, bronchitis, sinus infections and ear infections are examples of flu-related complications. If you have a medical condition, such as heart disease, cancer or diabetes, flu can make it worse. Flu can cause fever and chills, sore throat, muscle aches, fatigue, cough, headache, and runny or stuffy nose. Some people may have vomiting and diarrhea, though this is more common in children than adults. Each year thousands of people in the [place] die  from flu, and many more are hospitalized. Flu vaccine prevents millions of illnesses and flu-related visits to the doctor each year. 2. Influenza vaccine CDC recommends everyone 80 months of age and older get vaccinated every flu season. Children 6 months through 62 years of age may need 2 doses during a single flu season. Everyone else needs only 1 dose each flu season. It takes about 2 weeks for protection to develop after vaccination. There are many flu viruses, and they are always changing. Each year a new flu vaccine is made to protect against three or four viruses that are likely to cause disease in the upcoming flu season. Even when the vaccine doesn't exactly match these viruses, it may still provide some protection. Influenza vaccine does not cause flu. Influenza  vaccine may be given at the same time as other vaccines. 3. Talk with your health care provider Tell your vaccine provider if the person getting the vaccine:  Has had an allergic reaction after a previous dose of influenza vaccine, or has any severe, life-threatening allergies.  Has ever had Guillain-Barr Syndrome (also called GBS). In some cases, your health care provider may decide to postpone influenza vaccination to a future visit. People with minor illnesses, such as a cold, may be vaccinated. People who are moderately or severely ill should usually wait until they recover before getting influenza vaccine. Your health care provider can give you more information. 4. Risks of a vaccine reaction  Soreness, redness, and swelling where shot is given, fever, muscle aches, and headache can happen after influenza vaccine.  There may be a very small increased risk of Guillain-Barr Syndrome (GBS) after inactivated influenza vaccine (the flu shot). Young children who get the flu shot along with pneumococcal vaccine (PCV13), and/or DTaP vaccine at the same time might be slightly more likely to have a seizure caused by fever. Tell your health care provider if a child who is getting flu vaccine has ever had a seizure. People sometimes faint after medical procedures, including vaccination. Tell your provider if you feel dizzy or have vision changes or ringing in the ears. As with any medicine, there is a very remote chance of a vaccine causing a severe allergic reaction, other serious injury, or death. 5. What if there is a serious problem? An allergic reaction could occur after the vaccinated person leaves the clinic. If you see signs of a severe allergic reaction (hives, swelling of the face and throat, difficulty breathing, a fast heartbeat, dizziness, or weakness), call 9-1-1 and get the person to the nearest hospital. For other signs that concern you, call your health care provider. Adverse  reactions should be reported to the Vaccine Adverse Event Reporting System (VAERS). Your health care provider will usually file this report, or you can do it yourself. Visit the VAERS website at www.vaers.SamedayNews.es or call (408) 837-5343.VAERS is only for reporting reactions, and VAERS staff do not give medical advice. 6. The National Vaccine Injury Compensation Program The [employer] Vaccine Injury Compensation Program (VICP) is a federal program that was created to compensate people who may have been injured by certain vaccines. Visit the VICP website at GoldCloset.com.ee or call 201 882 4898 to learn about the program and about filing a claim. There is a time limit to file a claim for compensation. 7. How can I learn more?  Ask your healthcare provider.  Call your local or state health department.  Contact the Centers for Disease Control and Prevention (CDC): ? Call 516-263-0718 (1-800-CDC-INFO) or ? Visit CDC's  https://gibson.com/ Vaccine Information Statement (Interim) Inactivated Influenza Vaccine (02/26/2018) This information is not intended to replace advice given to you by your health care provider. Make sure you discuss any questions you have with your health care provider. Document Revised: 10/20/2018 Document Reviewed: 03/02/2018 Elsevier Patient Education  [place].

## 2020-03-13 NOTE — Assessment & Plan Note (Addendum)
Well controlled today Continue current medications Brought in Biometric results for [facility]-reviewed and abstracted F/u in 1 year

## 2020-03-14 ENCOUNTER — Encounter: Payer: Self-pay | Admitting: Family Medicine

## 2020-03-14 LAB — CBC WITH DIFFERENTIAL/PLATELET
Basophils Absolute: 0.1 10*3/uL (ref 0.0–0.2)
Basos: 1 %
EOS (ABSOLUTE): 0.3 10*3/uL (ref 0.0–0.4)
Eos: 3 %
Hematocrit: 38 % (ref 34.0–46.6)
Hemoglobin: 12.6 g/dL (ref 11.1–15.9)
Immature Grans (Abs): 0 10*3/uL (ref 0.0–0.1)
Immature Granulocytes: 0 %
Lymphocytes Absolute: 2.3 10*3/uL (ref 0.7–3.1)
Lymphs: 25 %
MCH: 30.8 pg (ref 26.6–33.0)
MCHC: 33.2 g/dL (ref 31.5–35.7)
MCV: 93 fL (ref 79–97)
Monocytes Absolute: 0.8 10*3/uL (ref 0.1–0.9)
Monocytes: 9 %
Neutrophils Absolute: 5.8 10*3/uL (ref 1.4–7.0)
Neutrophils: 62 %
Platelets: 355 10*3/uL (ref 150–450)
RBC: 4.09 x10E6/uL (ref 3.77–5.28)
RDW: 12.2 % (ref 11.7–15.4)
WBC: 9.2 10*3/uL (ref 3.4–10.8)

## 2020-03-14 LAB — VITAMIN D 25 HYDROXY (VIT D DEFICIENCY, FRACTURES): Vit D, 25-Hydroxy: 26.4 ng/mL — ABNORMAL LOW (ref 30.0–100.0)

## 2020-03-14 LAB — HEPATITIS C ANTIBODY: Hep C Virus Ab: <0.1 s/co ratio (ref 0.0–0.9)

## 2020-03-14 LAB — HIV ANTIBODY (ROUTINE TESTING W REFLEX): HIV Screen 4th Generation wRfx: NONREACTIVE

## 2020-03-14 LAB — VITAMIN B12: Vitamin B-12: 1602 pg/mL — ABNORMAL HIGH (ref 232–1245)

## 2020-03-14 NOTE — Telephone Encounter (Signed)
-----   Message from Virginia Crews, MD sent at 03/14/2020  3:08 PM EDT ----- Normal labs, except low vitamin d and high B12. Recommend to increase vitamin d supplement to 2000 units daily. Decrease B12 supplement by half.

## 2020-03-14 NOTE — Assessment & Plan Note (Signed)
Discussed importance of healthy weight management Discussed diet and exercise  

## 2020-03-21 ENCOUNTER — Telehealth (INDEPENDENT_AMBULATORY_CARE_PROVIDER_SITE_OTHER): Payer: Self-pay | Admitting: Gastroenterology

## 2020-03-21 ENCOUNTER — Other Ambulatory Visit: Payer: Self-pay

## 2020-03-21 DIAGNOSIS — Z1211 Encounter for screening for malignant neoplasm of colon: Secondary | ICD-10-CM

## 2020-03-21 NOTE — Progress Notes (Signed)
Will await patient to call me back to schedule her colonoscopy.  She has some concerns regarding the diet.  She is vegan.  I informed her that I will send instructions for her to review and she can call me back when she is ready to schedule.  Thanks,  [place], [place]

## 2020-04-02 ENCOUNTER — Other Ambulatory Visit: Payer: Self-pay | Admitting: Physician Assistant

## 2020-04-02 NOTE — Telephone Encounter (Signed)
Requested medication (s) are due for refill today: yes  Requested medication (s) are on the active medication list: yes  Last refill:  02/03/20  Future visit scheduled: yes  Notes to clinic:  med not delegated to NT to refill   Requested Prescriptions  Pending Prescriptions Disp Refills   ALPRAZolam (XANAX) 0.25 MG tablet [Pharmacy Med Name: ALPRAZOLAM 0.25MG  TABLETS] 30 tablet     Sig: TAKE 1 TABLET(0.25 MG) BY MOUTH AT BEDTIME AS NEEDED FOR ANXIETY      Not Delegated - Psychiatry:  Anxiolytics/Hypnotics Failed - 04/02/2020  2:26 PM      Failed - This refill cannot be delegated      Failed - Urine Drug Screen completed in last 360 days.      Passed - Valid encounter within last 6 months    Recent Outpatient Visits           2 weeks ago Encounter for annual physical exam   [facility] [place], Dionne Bucy, MD   8 months ago Acute right-sided low back pain with right-sided sciatica   [facility] [place], Dionne Bucy, MD   8 months ago Acute bilateral low back pain without sciatica   [facility] [place], PA-C   1 year ago Encounter for annual physical exam   [facility] [place], Dionne Bucy, MD   1 year ago Essential hypertension   [place], Dionne Bucy, MD       Future Appointments             In 5 months Bacigalupo, Dionne Bucy, MD [facility], [place]   In 11 months Bacigalupo, Dionne Bucy, MD [facility], [place]

## 2020-04-05 ENCOUNTER — Encounter: Payer: Self-pay | Admitting: Family Medicine

## 2020-04-05 MED ORDER — AMLODIPINE BESYLATE 5 MG PO TABS: 5.0000 mg | ORAL_TABLET | Freq: Every day | ORAL | 1 refills | Status: DC

## 2020-04-05 NOTE — Telephone Encounter (Signed)
RX sent to [facility] in [place],   -Mickel Baas

## 2020-09-11 ENCOUNTER — Encounter: Payer: Self-pay | Admitting: Family Medicine

## 2020-09-11 ENCOUNTER — Ambulatory Visit: Payer: BC Managed Care – PPO | Admitting: Family Medicine

## 2020-09-11 ENCOUNTER — Other Ambulatory Visit: Payer: Self-pay

## 2020-09-11 VITALS — BP 146/77 | HR 72 | Temp 99.0°F | Wt 196.0 lb

## 2020-09-11 DIAGNOSIS — E538 Deficiency of other specified B group vitamins: Secondary | ICD-10-CM | POA: Diagnosis not present

## 2020-09-11 DIAGNOSIS — E559 Vitamin D deficiency, unspecified: Secondary | ICD-10-CM

## 2020-09-11 DIAGNOSIS — F4321 Adjustment disorder with depressed mood: Secondary | ICD-10-CM

## 2020-09-11 DIAGNOSIS — I10 Essential (primary) hypertension: Secondary | ICD-10-CM

## 2020-09-11 DIAGNOSIS — L819 Disorder of pigmentation, unspecified: Secondary | ICD-10-CM

## 2020-09-11 MED ORDER — ALPRAZOLAM 0.25 MG PO TABS: 0.2500 mg | ORAL_TABLET | Freq: Every day | ORAL | 1 refills | Status: DC | PRN

## 2020-09-11 MED ORDER — AMLODIPINE BESYLATE 5 MG PO TABS: 5.0000 mg | ORAL_TABLET | Freq: Every day | ORAL | 1 refills | Status: DC

## 2020-09-11 NOTE — Assessment & Plan Note (Signed)
New problem x6 to 8 months Appearance looks possibly like hemosiderin deposits Check iron panel

## 2020-09-11 NOTE — Assessment & Plan Note (Signed)
Grieving the loss of her dog 1 to 2 weeks ago She is feeling a lot of guilt around this No signs of depression Return precautions discussed

## 2020-09-11 NOTE — Assessment & Plan Note (Signed)
Blood pressure slightly elevated today, but she does have whitecoat hypertension that means her pressures are higher in our office She will resume checking home blood pressures If they are elevated, consider increasing amlodipine dose Recheck metabolic panel Follow-up in 6 months

## 2020-09-11 NOTE — Progress Notes (Signed)
Established patient visit   Patient: Dana Livingston   DOB: 05/20/1971   50 y.o. Female  MRN: [id number] Visit Date: 09/11/2020  Today's healthcare provider: Lavon Paganini, MD   Chief Complaint  Patient presents with  . Hypertension   Subjective    HPI  Hypertension, follow-up  BP Readings from Last 3 Encounters:  09/11/20 (!) 146/77  03/13/20 138/83  07/21/19 (!) 158/99   Wt Readings from Last 3 Encounters:  09/11/20 196 lb (88.9 kg)  03/13/20 194 lb 12.8 oz (88.4 kg)  07/17/19 195 lb (88.5 kg)     She was last seen for hypertension 6 months ago.  BP at that visit was 138/83. Management since that visit includes none.  She reports good compliance with treatment. She is not having side effects.  She is following a Regular diet. She is not exercising. She does not smoke.  Use of agents associated with hypertension: none.   Outside blood pressures are not recorded.  Symptoms: No chest pain No chest pressure  No palpitations No syncope  No dyspnea No orthopnea  No paroxysmal nocturnal dyspnea No lower extremity edema   Pertinent labs: Lab Results  Component Value Date   CHOL 178 02/09/2020   HDL 45 02/09/2020   LDLCALC 118 02/09/2020   TRIG 80 02/09/2020   Lab Results  Component Value Date   NA 138 09/09/2018   K 4.2 09/09/2018   CREATININE 0.8 02/09/2020   GFRNONAA 89 02/09/2020   GFRAA 103 02/09/2020   GLUCOSE 89 09/09/2018     The 10-year ASCVD risk score Mikey Bussing DC Jr., et al., 2013) is: 2.6%* (Cholesterol units were assumed)   --------------------------------------------------------------------------------------------------- Grieving the loss of her dog, but does not feel truly depressed.  Has a dark-colored rash on both feet that started last summer.  Her husband seems to think that it changes over time, but she has not noticed  Patient Active Problem List   Diagnosis Date Noted  . Grieving 09/11/2020  . Hyperpigmentation 09/11/2020   . 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 Obesity 03/26/2017  . Hypertension   . Anxiety   . Panic attacks   . 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 Elevated cancer antigen 125 (CA-125) 01/18/2015  . Fibroids 01/18/2015   Social History   Tobacco Use  . Smoking status: Never Smoker  . Smokeless tobacco: Never Used  Vaping Use  . Vaping Use: Never used  Substance Use Topics  . Alcohol use: No  . Drug use: No   Allergies  Allergen Reactions  . Ciprofloxacin Palpitations  . Toradol [Ketorolac Tromethamine] Anaphylaxis, Shortness Of Breath and Swelling  . Tetracyclines & Related Nausea And Vomiting  . Other Other (See Comments)    Cats cause sneezing       Medications: Outpatient Medications Prior to Visit  Medication Sig  . ASHWAGANDHA PO Take 2 capsules by mouth daily. 1300 mg daily  . Chaste Tree (VITEX EXTRACT PO) Take 2 capsules by mouth daily.   . Cholecalciferol (VITAMIN D) 2000 units CAPS Take 1 capsule by mouth daily.   . Cyanocobalamin (B-12 SL) Place under the tongue.   Marland Kitchen doxylamine, Sleep, (UNISOM) 25 MG tablet Take 25 mg by mouth at bedtime as needed.  . IRON PO Take by mouth.   . Multiple Vitamin (MULTIVITAMIN) capsule Take 1 capsule by mouth daily.   . Omega-3 Fatty Acids (OMEGA 3 500) 500 MG CAPS Take 525 mg by mouth. VEGAN  . PASSION FLOWER-VALERIAN PO  Take by mouth.   . Selenium 200 MCG CAPS Take 1 capsule by mouth every other day.  . TURMERIC PO Take 2 tablets by mouth daily.   . [DISCONTINUED] ALPRAZolam (XANAX) 0.25 MG tablet TAKE 1 TABLET(0.25 MG) BY MOUTH AT BEDTIME AS NEEDED FOR ANXIETY  . [DISCONTINUED] amLODipine (NORVASC) 5 MG tablet Take 1 tablet (5 mg total) by mouth daily.   No facility-administered medications prior to visit.    Review of Systems -per HPI       Objective    BP (!) 146/77 (BP Location: Right Arm, Patient Position: Sitting, Cuff Size: Normal)   Pulse 72   Temp 99 F (37.2 C) (Oral)   Wt 196 lb (88.9 kg)   SpO2 100%   BMI  33.64 kg/m  BP Readings from Last 3 Encounters:  09/11/20 (!) 146/77  03/13/20 138/83  07/21/19 (!) 158/99   Wt Readings from Last 3 Encounters:  09/11/20 196 lb (88.9 kg)  03/13/20 194 lb 12.8 oz (88.4 kg)  07/17/19 195 lb (88.5 kg)      Physical Exam Vitals reviewed.  Constitutional:      General: She is not in acute distress.    Appearance: Normal appearance. She is well-developed. She is not diaphoretic.  HENT:     Head: Normocephalic and atraumatic.  Eyes:     General: No scleral icterus.    Conjunctiva/sclera: Conjunctivae normal.  Neck:     Thyroid: No thyromegaly.  Cardiovascular:     Rate and Rhythm: Normal rate and regular rhythm.     Pulses: Normal pulses.     Heart sounds: Normal heart sounds. No murmur heard.   Pulmonary:     Effort: Pulmonary effort is normal. No respiratory distress.     Breath sounds: Normal breath sounds. No wheezing, rhonchi or rales.  Musculoskeletal:     Cervical back: Neck supple.     Right lower leg: No edema.     Left lower leg: No edema.  Lymphadenopathy:     Cervical: No cervical adenopathy.  Skin:    General: Skin is warm and dry.     Findings: Rash (hyperpigmentation in spots along sides of feet and dorasal surface of midfoot) present.  Neurological:     Mental Status: She is alert and oriented to person, place, and time. Mental status is at baseline.  Psychiatric:        Mood and Affect: Mood normal.        Behavior: Behavior normal.      No results found for any visits on 09/11/20.  Assessment & Plan     Problem List Items Addressed This Visit      Cardiovascular and Mediastinum   Hypertension - Primary    Blood pressure slightly elevated today, but she does have whitecoat hypertension that means her pressures are higher in our office She will resume checking home blood pressures If they are elevated, consider increasing amlodipine dose Recheck metabolic panel Follow-up in 6 months      Relevant  Medications   amLODipine (NORVASC) 5 MG tablet   Other Relevant Orders   Basic Metabolic Panel (BMET)     Musculoskeletal and Integument   Hyperpigmentation    New problem x6 to 8 months Appearance looks possibly like hemosiderin deposits Check iron panel      Relevant Orders   Fe+TIBC+Fer     Other   Grieving    Grieving the loss of her dog 1 to 2 weeks ago She  is feeling a lot of guilt around this No signs of depression Return precautions discussed       Other Visit Diagnoses    B12 deficiency       Relevant Orders   B12   Vitamin D deficiency       Relevant Orders   VITAMIN D 25 Hydroxy (Vit-D Deficiency, Fractures)       Return in about 6 months (around 03/11/2021) for CPE.      I, Lavon Paganini, MD, have reviewed all documentation for this visit. The documentation on 09/11/20 for the exam, diagnosis, procedures, and orders are all accurate and complete.   Dana Livingston, Dionne Bucy, MD, MPH [facility]

## 2020-09-13 DIAGNOSIS — L819 Disorder of pigmentation, unspecified: Secondary | ICD-10-CM | POA: Diagnosis not present

## 2020-09-13 DIAGNOSIS — E538 Deficiency of other specified B group vitamins: Secondary | ICD-10-CM | POA: Diagnosis not present

## 2020-09-13 DIAGNOSIS — E559 Vitamin D deficiency, unspecified: Secondary | ICD-10-CM | POA: Diagnosis not present

## 2020-09-13 DIAGNOSIS — I10 Essential (primary) hypertension: Secondary | ICD-10-CM | POA: Diagnosis not present

## 2020-09-14 LAB — BASIC METABOLIC PANEL
BUN/Creatinine Ratio: 13 (ref 9–23)
BUN: 9 mg/dL (ref 6–24)
CO2: 24 mmol/L (ref 20–29)
Calcium: 9.4 mg/dL (ref 8.7–10.2)
Chloride: 98 mmol/L (ref 96–106)
Creatinine, Ser: 0.67 mg/dL (ref 0.57–1.00)
Glucose: 89 mg/dL (ref 65–99)
Potassium: 4.5 mmol/L (ref 3.5–5.2)
Sodium: 136 mmol/L (ref 134–144)
eGFR: 106 mL/min/{1.73_m2} (ref >59)

## 2020-09-14 LAB — IRON,TIBC AND FERRITIN PANEL
Ferritin: 31 ng/mL (ref 15–150)
Iron Saturation: 23 % (ref 15–55)
Iron: 59 ug/dL (ref 27–159)
Total Iron Binding Capacity: 258 ug/dL (ref 250–450)
UIBC: 199 ug/dL (ref 131–425)

## 2020-09-14 LAB — VITAMIN B12: Vitamin B-12: 949 pg/mL (ref 232–1245)

## 2020-09-14 LAB — VITAMIN D 25 HYDROXY (VIT D DEFICIENCY, FRACTURES): Vit D, 25-Hydroxy: 32.1 ng/mL (ref 30.0–100.0)

## 2020-12-10 DIAGNOSIS — Z20822 Contact with and (suspected) exposure to covid-19: Secondary | ICD-10-CM | POA: Diagnosis not present

## 2020-12-12 ENCOUNTER — Other Ambulatory Visit: Payer: Self-pay

## 2020-12-12 ENCOUNTER — Ambulatory Visit: Payer: Self-pay | Admitting: *Deleted

## 2020-12-12 ENCOUNTER — Encounter: Payer: Self-pay | Admitting: Family Medicine

## 2020-12-12 MED ORDER — NIRMATRELVIR/RITONAVIR (PAXLOVID)TABLET
3.0000 | ORAL_TABLET | Freq: Two times a day (BID) | ORAL | 0 refills | Status: AC
  Filled 2020-12-12: qty 30, 5d supply, fill #0

## 2020-12-12 NOTE — Addendum Note (Signed)
Addended by: Virginia Crews on: 12/12/2020 05:01 PM   Modules accepted: Orders

## 2020-12-12 NOTE — Telephone Encounter (Signed)
She does not need a lab (has GFR from 3/22 of 95), but only the hospital pharmacies carry COVID antivirals. If she wants an antiviral, she will need to do this. There are requirements for this medication.

## 2020-12-12 NOTE — Telephone Encounter (Signed)
I called patient and advised her of message below, patient became upset on the phone and started to yell after I asked her to calm down. Patient states that she does not see why she needs to have labwork done when she states she was advised to quarantine and not leave the house, patient became upset stating that she is very ill and does not feel right going out around others. When I ask patient about hospital pharmacy she became more upset on phone stating that she only uses [facility] and states that she is having a hard time finding some one to go to pharmacy for her. Patients exact words were " I only have covid and you guys are trying to have me run everywhere to do these things and I dont know why all this has to be done." Please advise. KW

## 2020-12-12 NOTE — Telephone Encounter (Signed)
Per initial encounter, "Patient was informed she's positive with COVID, experiencing 99.5 temperature and coughing seeking clinical advice"; contacted pt to discuss her symptoms; the pt says [facility]; pt started having scratchy throat on 12/10/20; she was exposed app 12/05/20; she would like to know if she should receive an antiviral;  recommendations made per nurse triage protocol; the pt is seen by Dr Brita Romp at [facility]; there is no availability; the pt was encouraged to consider a [place] or seeking evaluation at Urgent Care; she would like for this to be reviewed by a provider in the office instead; the pt can be contacted at [phone]; spoke with Arbie Cookey regarding this issue and she request this triage note be forwarded to the office; forwarding to office per request.    Reason for Disposition . [1] HIGH RISK for severe COVID complications (e.g., weak immune system, age > 38 years, obesity with BMI > 25, pregnant, chronic lung disease or other chronic medical condition) AND [2] COVID symptoms (e.g., cough, fever)  (Exceptions: Already seen by PCP and no new or worsening symptoms.)  Answer Assessment - Initial Assessment Questions 1. COVID-19 DIAGNOSIS: "Who made your COVID-19 diagnosis?" "Was it confirmed by a positive lab test or self-test?" If not diagnosed by a doctor (or NP/PA), ask "Are there lots of cases (community spread) where you live?" Note: See public health department website, if unsure.     [facility] 2. COVID-19 EXPOSURE: "Was there any known exposure to COVID before the symptoms began?" CDC Definition of close contact: within 6 feet (2 meters) for a total of 15 minutes or more over a 24-hour period.      12/05/20 3. ONSET: "When did the COVID-19 symptoms start?"      12/10/20 4. WORST SYMPTOM: "What is your worst symptom?" (e.g., cough, fever, shortness of breath, muscle aches)     cough 5. COUGH: "Do you have a cough?" If  Yes, ask: "How bad is the cough?"      yes 6. FEVER: "Do you have a fever?" If Yes, ask: "What is your temperature, how was it measured, and when did it start?"    Lower 99.5 7. RESPIRATORY STATUS: "Describe your breathing?" (e.g., shortness of breath, wheezing, unable to speak)      no 8. BETTER-SAME-WORSE: "Are you getting better, staying the same or getting worse compared to yesterday?"  If getting worse, ask, "In what way?"    same 9. HIGH RISK DISEASE: "Do you have any chronic medical problems?" (e.g., asthma, heart or lung disease, weak immune system, obesity, etc.) HTN, asthma 10. VACCINE: "Have you had the COVID-19 vaccine?" If Yes, ask: "Which one, how man  y shots, when did you get it?"      Pfizer  08/26/19, 09/16/19 11. BOOSTER: "Have you received your COVID-19 booster?" If Yes, ask: "Which one and when did you get it?"      Pfizer 04/28/20 12. PREGNANCY: "Is there any chance you are pregnant?" "When was your last menstrual period?"   Irregular,  LMP 10/2020 13. OTHER SYMPTOMS: "Do you have any other symptoms?"  (e.g., chills, fatigue, headache, loss of smell or taste, muscle pain, sore throat)   Temp 99.5 14. O2 SATURATION MONITOR:  "Do you use an oxygen saturation monitor (pulse oximeter) at home?" If Yes, ask "What is your reading (oxygen level) today?" "What is your usual oxygen saturation reading?" (e.g., 95%)  Protocols used: CORONAVIRUS (COVID-19) DIAGNOSED OR SUSPECTED-A-AH

## 2020-12-12 NOTE — Telephone Encounter (Signed)
Patient advised. Will pick up medication at [facility] outpatient pharmacy.

## 2020-12-12 NOTE — Telephone Encounter (Signed)
She is a candidate for Paxlovid COVID antiviral treatment. If she wants this, need to confirm 1st day of symtpoms (5/29), what hospital pharmacy she would like to use, and need to note last GFR from her chart.  If it has been more than 6 months since she has a GFR, we would need to get a stat lab today.

## 2020-12-16 DIAGNOSIS — Z20822 Contact with and (suspected) exposure to covid-19: Secondary | ICD-10-CM | POA: Diagnosis not present

## 2021-01-24 ENCOUNTER — Other Ambulatory Visit: Payer: Self-pay

## 2021-01-24 ENCOUNTER — Ambulatory Visit: Payer: BC Managed Care – PPO | Admitting: Internal Medicine

## 2021-01-24 ENCOUNTER — Encounter: Payer: Self-pay | Admitting: Internal Medicine

## 2021-01-24 ENCOUNTER — Ambulatory Visit
Admission: RE | Admit: 2021-01-24 | Discharge: 2021-01-24 | Disposition: A | Discharge status: Home / Self Care | Payer: BC Managed Care – PPO | Source: Ambulatory Visit | Attending: Internal Medicine | Admitting: Internal Medicine

## 2021-01-24 ENCOUNTER — Other Ambulatory Visit: Payer: Self-pay | Admitting: Internal Medicine

## 2021-01-24 ENCOUNTER — Ambulatory Visit: Payer: Self-pay | Admitting: *Deleted

## 2021-01-24 ENCOUNTER — Ambulatory Visit
Admission: RE | Admit: 2021-01-24 | Discharge: 2021-01-24 | Disposition: A | Discharge status: Home / Self Care | Payer: BC Managed Care – PPO | Attending: Internal Medicine | Admitting: Internal Medicine

## 2021-01-24 VITALS — BP 165/96 | HR 88 | Temp 98.8°F | Ht 64.02 in | Wt 197.0 lb

## 2021-01-24 DIAGNOSIS — R053 Chronic cough: Secondary | ICD-10-CM | POA: Insufficient documentation

## 2021-01-24 DIAGNOSIS — R059 Cough, unspecified: Secondary | ICD-10-CM | POA: Diagnosis not present

## 2021-01-24 MED ORDER — FEXOFENADINE HCL 180 MG PO TABS: 180.0000 mg | ORAL_TABLET | Freq: Every day | ORAL | 1 refills | Status: DC

## 2021-01-24 MED ORDER — ALBUTEROL SULFATE HFA 108 (90 BASE) MCG/ACT IN AERS: 2.0000 | INHALATION_SPRAY | Freq: Four times a day (QID) | RESPIRATORY_TRACT | 1 refills | Status: DC | PRN

## 2021-01-24 MED ORDER — LEVALBUTEROL TARTRATE 45 MCG/ACT IN AERO: 1.0000 | INHALATION_SPRAY | RESPIRATORY_TRACT | 1 refills | Status: DC | PRN

## 2021-01-24 MED ORDER — BENZONATATE 100 MG PO CAPS: 100.0000 mg | ORAL_CAPSULE | Freq: Two times a day (BID) | ORAL | 0 refills | Status: DC | PRN

## 2021-01-24 NOTE — Progress Notes (Signed)
BP (!) 165/96   Pulse 88   Temp 98.8 F (37.1 C) (Oral)   Ht 5' 4.02" (1.626 m)   Wt 197 lb (89.4 kg)   SpO2 98%   BMI 33.80 kg/m    Subjective:    Patient ID: Dana Livingston, female    DOB: 06/09/1971, 50 y.o.   MRN: [id number]  Chief Complaint  Patient presents with   Cough    DX covid on 12/11/20, when she coughs, she feels like something is sucking the air out of her lungs    HPI: Dana Livingston is a 50 y.o. female  Cough This is a recurrent (x 6 weeks had COVID x july 4th. took albuterol helps some, no wheezing used to have asthma when she was younger.) problem. The cough is Non-productive. Associated symptoms include nasal congestion and shortness of breath. Pertinent negatives include no chest pain, chills, ear congestion, ear pain, fever, headaches, heartburn, hemoptysis, myalgias, postnasal drip, rash, rhinorrhea, sweats, weight loss or wheezing.   Chief Complaint  Patient presents with   Cough    DX covid on 12/11/20, when she coughs, she feels like something is sucking the air out of her lungs    Relevant past medical, surgical, family and social history reviewed and updated as indicated. Interim medical history since our last visit reviewed. Allergies and medications reviewed and updated.  Review of Systems  Constitutional:  Negative for chills, fever and weight loss.  HENT:  Negative for ear pain, postnasal drip and rhinorrhea.   Respiratory:  Positive for cough and shortness of breath. Negative for hemoptysis and wheezing.   Cardiovascular:  Negative for chest pain.  Gastrointestinal:  Negative for heartburn.  Musculoskeletal:  Negative for myalgias.  Skin:  Negative for rash.  Neurological:  Negative for headaches.   Per HPI unless specifically indicated above     Objective:    BP (!) 165/96   Pulse 88   Temp 98.8 F (37.1 C) (Oral)   Ht 5' 4.02" (1.626 m)   Wt 197 lb (89.4 kg)   SpO2 98%   BMI 33.80 kg/m   Wt Readings from Last 3  Encounters:  01/24/21 197 lb (89.4 kg)  09/11/20 196 lb (88.9 kg)  03/13/20 194 lb 12.8 oz (88.4 kg)    Physical Exam Vitals and nursing note reviewed.  Constitutional:      General: She is not in acute distress.    Appearance: Normal appearance. She is not ill-appearing or diaphoretic.  HENT:     Head: Normocephalic and atraumatic.     Right Ear: Tympanic membrane and external ear normal. There is no impacted cerumen.     Left Ear: External ear normal.     Nose: No congestion or rhinorrhea.     Mouth/Throat:     Pharynx: No oropharyngeal exudate or posterior oropharyngeal erythema.  Eyes:     Conjunctiva/sclera: Conjunctivae normal.     Pupils: Pupils are equal, round, and reactive to light.  Cardiovascular:     Rate and Rhythm: Normal rate and regular rhythm.     Heart sounds: No murmur heard.   No friction rub. No gallop.  Pulmonary:     Effort: No respiratory distress.     Breath sounds: No stridor. No wheezing or rhonchi.  Chest:     Chest wall: No tenderness.  Abdominal:     General: Abdomen is flat. Bowel sounds are normal. There is no distension.     Palpations: Abdomen is soft. There  is no mass.     Tenderness: There is no abdominal tenderness. There is no guarding.  Musculoskeletal:        General: No swelling or deformity.     Cervical back: Normal range of motion and neck supple. No rigidity or tenderness.     Right lower leg: No edema.     Left lower leg: No edema.  Skin:    General: Skin is warm and dry.     Coloration: Skin is not jaundiced.     Findings: No erythema.  Neurological:     Mental Status: She is alert and oriented to person, place, and time. Mental status is at baseline.  Psychiatric:        Mood and Affect: Mood normal.        Behavior: Behavior normal.        Thought Content: Thought content normal.        Judgment: Judgment normal.    Results for orders placed or performed in visit on 09/11/20  Fe+TIBC+Fer  Result Value Ref Range    Total Iron Binding Capacity 258 250 - 450 ug/dL   UIBC 199 131 - 425 ug/dL   Iron 59 27 - 159 ug/dL   Iron Saturation 23 15 - 55 %   Ferritin 31 15 - 150 ng/mL  B12  Result Value Ref Range   Vitamin B-12 949 232 - 1,245 pg/mL  VITAMIN D 25 Hydroxy (Vit-D Deficiency, Fractures)  Result Value Ref Range   Vit D, 25-Hydroxy 32.1 30.0 - 100.0 ng/mL  Basic Metabolic Panel (BMET)  Result Value Ref Range   Glucose 89 65 - 99 mg/dL   BUN 9 6 - 24 mg/dL   Creatinine, Ser 0.67 0.57 - 1.00 mg/dL   eGFR 106 >59 mL/min/1.73   BUN/Creatinine Ratio 13 9 - 23   Sodium 136 134 - 144 mmol/L   Potassium 4.5 3.5 - 5.2 mmol/L   Chloride 98 96 - 106 mmol/L   CO2 24 20 - 29 mmol/L   Calcium 9.4 8.7 - 10.2 mg/dL        Current Outpatient Medications:    ALPRAZolam (XANAX) 0.25 MG tablet, Take 1 tablet (0.25 mg total) by mouth daily as needed for anxiety., Disp: 30 tablet, Rfl: 1   amLODipine (NORVASC) 5 MG tablet, Take 1 tablet (5 mg total) by mouth daily., Disp: 90 tablet, Rfl: 1   ASHWAGANDHA PO, Take 2 capsules by mouth daily. 1300 mg daily, Disp: , Rfl:    Chaste Tree (VITEX EXTRACT PO), Take 2 capsules by mouth daily. , Disp: , Rfl:    Cholecalciferol (VITAMIN D) 2000 units CAPS, Take 1 capsule by mouth daily. , Disp: , Rfl:    Cyanocobalamin (B-12 SL), Place under the tongue. , Disp: , Rfl:    doxylamine, Sleep, (UNISOM) 25 MG tablet, Take 25 mg by mouth at bedtime as needed., Disp: , Rfl:    IRON PO, Take by mouth. , Disp: , Rfl:    Multiple Vitamin (MULTIVITAMIN) capsule, Take 1 capsule by mouth daily. , Disp: , Rfl:    Omega-3 Fatty Acids (OMEGA 3 500) 500 MG CAPS, Take 525 mg by mouth. VEGAN, Disp: , Rfl:    PASSION FLOWER-VALERIAN PO, Take by mouth. , Disp: , Rfl:    Selenium 200 MCG CAPS, Take 1 capsule by mouth every other day., Disp: , Rfl:    TURMERIC PO, Take 2 tablets by mouth daily. 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   Anxiety chronic stable, is on xanax prn.  Will need to follow-up with PCP on anxiety she seems very anxious about everything including her COVID and brother who has had multiple legal issues.  Problem List Items Addressed This Visit   None    No orders of the defined types were placed in this encounter.    Meds ordered this encounter  Medications   DISCONTD: albuterol (VENTOLIN HFA) 108 (90 Base) MCG/ACT inhaler    Sig: Inhale 2 puffs into the lungs every 6 (six) hours as needed for wheezing or shortness of breath.    Dispense:  8 g    Refill:  1   fexofenadine (ALLEGRA ALLERGY) 180 MG tablet    Sig: Take 1 tablet (180 mg total) by mouth daily.    Dispense:  10 tablet    Refill:  1   levalbuterol (XOPENEX HFA) 45 MCG/ACT inhaler    Sig: Inhale 1-2 puffs into the lungs every 4 (four) hours as needed for wheezing.    Dispense:  1 each    Refill:  1     Follow up plan: No follow-ups on file.

## 2021-01-24 NOTE — Telephone Encounter (Signed)
Reason for Disposition  [1] MILD difficulty breathing (e.g., minimal/no SOB at rest, SOB with walking, pulse <100) AND [2] NEW-onset or WORSE than normal  Answer Assessment - Initial Assessment Questions 1. RESPIRATORY STATUS: "Describe your breathing?" (e.g., wheezing, shortness of breath, unable to speak, severe coughing)      SOB- almost like asthma 2. ONSET: "When did this breathing problem begin?"      Intermittent- worse last couple days 3. PATTERN "Does the difficult breathing come and go, or has it been constant since it started?"      Comes and goes 4. SEVERITY: "How bad is your breathing?" (e.g., mild, moderate, severe)    - MILD: No SOB at rest, mild SOB with walking, speaks normally in sentences, can lie down, no retractions, pulse < 100.    - MODERATE: SOB at rest, SOB with minimal exertion and prefers to sit, cannot lie down flat, speaks in phrases, mild retractions, audible wheezing, pulse 100-120.    - SEVERE: Very SOB at rest, speaks in single words, struggling to breathe, sitting hunched forward, retractions, pulse > 120      Mild/moderate 5. RECURRENT SYMPTOM: "Have you had difficulty breathing before?" If Yes, ask: "When was the last time?" and "What happened that time?"      Yes- hx asthma- does have albuterol inhaler- does not help 6. CARDIAC HISTORY: "Do you have any history of heart disease?" (e.g., heart attack, angina, bypass surgery, angioplasty)      no 7. LUNG HISTORY: "Do you have any history of lung disease?"  (e.g., pulmonary embolus, asthma, emphysema)     Childhood asthma 8. CAUSE: "What do you think is causing the breathing problem?"      Post COVID 9. OTHER SYMPTOMS: "Do you have any other symptoms? (e.g., dizziness, runny nose, cough, chest pain, fever)     cough 10. O2 SATURATION MONITOR:  "Do you use an oxygen saturation monitor (pulse oximeter) at home?" If Yes, "What is your reading (oxygen level) today?" "What is your usual oxygen saturation  reading?" (e.g., 95%)       Normal the whole time 11. PREGNANCY: "Is there any chance you are pregnant?" "When was your last menstrual period?"       N/a 12. TRAVEL: "Have you traveled out of the country in the last month?" (e.g., travel history, exposures)       Post COVID  Protocols used: Breathing Difficulty-A-AH

## 2021-01-24 NOTE — Telephone Encounter (Signed)
FYI....  Pt has appointment today at [facility],   -Mickel Baas

## 2021-01-24 NOTE — Telephone Encounter (Signed)
Requested medication (s) are due for refill today - no  Requested medication (s) are on the active medication list -no  Future visit scheduled -yes  Last refill: Rx discontinued today  Notes to clinic: Request for changes to Rx - sent to PCP for review   Requested Prescriptions  Pending Prescriptions Disp Refills   albuterol (VENTOLIN HFA) 108 (90 Base) MCG/ACT inhaler [Pharmacy Med Name: ALBUTEROL HFA INH (200 PUFFS) 6.7GM] 20.1 g     Sig: INHALE 2 PUFFS INTO THE LUNGS EVERY 6 HOURS AS NEEDED FOR WHEEZING OR SHORTNESS OF BREATH      Pulmonology:  Beta Agonists Failed - 01/24/2021  4:17 PM      Failed - One inhaler should last at least one month. If the patient is requesting refills earlier, contact the patient to check for uncontrolled symptoms.      Passed - Valid encounter within last 12 months    Recent Outpatient Visits           Today Chronic cough   [facility] Vigg, Avanti, MD   4 months ago Primary hypertension   [facility], Dionne Bucy, MD   10 months ago Encounter for annual physical exam   [facility] [place], Dionne Bucy, MD   1 year ago Acute right-sided low back pain with right-sided sciatica   [facility], Dionne Bucy, MD   1 year ago Acute bilateral low back pain without sciatica   [facility] Trinna Post, PA-C       Future Appointments             In 1 month Bacigalupo, Dionne Bucy, MD [facility], [facility]                 Requested Prescriptions  Pending Prescriptions Disp Refills   albuterol (VENTOLIN HFA) 108 (90 Base) MCG/ACT inhaler [Pharmacy Med Name: ALBUTEROL HFA INH (200 PUFFS) 6.7GM] 20.1 g     Sig: INHALE 2 PUFFS INTO THE LUNGS EVERY 6 HOURS AS NEEDED FOR WHEEZING OR SHORTNESS OF BREATH      Pulmonology:  Beta Agonists Failed - 01/24/2021  4:17 PM      Failed - One inhaler should last at least one month. If the patient is requesting  refills earlier, contact the patient to check for uncontrolled symptoms.      Passed - Valid encounter within last 12 months    Recent Outpatient Visits           Today Chronic cough   [facility] Vigg, Avanti, MD   4 months ago Primary hypertension   [facility] [place], Dionne Bucy, MD   10 months ago Encounter for annual physical exam   [facility] [place], Dionne Bucy, MD   1 year ago Acute right-sided low back pain with right-sided sciatica   [facility], Dionne Bucy, MD   1 year ago Acute bilateral low back pain without sciatica   [facility] Trinna Post, [place]       Future Appointments             In 1 month Bacigalupo, Dionne Bucy, MD [facility], [place]

## 2021-01-24 NOTE — Telephone Encounter (Signed)
Patient is calling to report she was diagnosed with COVID 6/4 and since then- her breathing has not returned to normal. Patient states she almost feels like her bronchial tubes are under pressure. Patient does have history of asthma tears ago- she has used an old inhaler- but that does not help. SOB- comes and goes, worse in afternoon.

## 2021-01-25 NOTE — Telephone Encounter (Signed)
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

## 2021-01-25 NOTE — Telephone Encounter (Signed)
Requested medication (s) are due for refill today - no  Requested medication (s) are on the active medication list -no  Future visit scheduled -yes  Last refill: not on list  Notes to clinic: Patient was seen as acute at [place] is requesting Rx changes- sent for review of request  Requested Prescriptions  Pending Prescriptions Disp Refills   albuterol (VENTOLIN HFA) 108 (90 Base) MCG/ACT inhaler [Pharmacy Med Name: ALBUTEROL HFA INH (200 PUFFS) 6.7GM] 20.1 g     Sig: INHALE 2 PUFFS INTO THE LUNGS EVERY 6 HOURS AS NEEDED FOR WHEEZING OR SHORTNESS OF BREATH      Pulmonology:  Beta Agonists Failed - 01/24/2021  4:17 PM      Failed - One inhaler should last at least one month. If the patient is requesting refills earlier, contact the patient to check for uncontrolled symptoms.      Passed - Valid encounter within last 12 months    Recent Outpatient Visits           Yesterday Chronic cough   [facility] Vigg, Avanti, MD   4 months ago Primary hypertension   [facility], Dionne Bucy, MD   10 months ago Encounter for annual physical exam   [facility] [place], Dionne Bucy, MD   1 year ago Acute right-sided low back pain with right-sided sciatica   [facility], Dionne Bucy, MD   1 year ago Acute bilateral low back pain without sciatica   [facility] Trinna Post, PA-C       Future Appointments             In 1 month Bacigalupo, Dionne Bucy, MD [facility], [facility]                 Requested Prescriptions  Pending Prescriptions Disp Refills   albuterol (VENTOLIN HFA) 108 (90 Base) MCG/ACT inhaler [Pharmacy Med Name: ALBUTEROL HFA INH (200 PUFFS) 6.7GM] 20.1 g     Sig: INHALE 2 PUFFS INTO THE LUNGS EVERY 6 HOURS AS NEEDED FOR WHEEZING OR SHORTNESS OF BREATH      Pulmonology:  Beta Agonists Failed - 01/24/2021  4:17 PM      Failed - One inhaler should last at least  one month. If the patient is requesting refills earlier, contact the patient to check for uncontrolled symptoms.      Passed - Valid encounter within last 12 months    Recent Outpatient Visits           Yesterday Chronic cough   [facility] Vigg, Avanti, MD   4 months ago Primary hypertension   [facility] [place], Dionne Bucy, MD   10 months ago Encounter for annual physical exam   [facility] [place], Dionne Bucy, MD   1 year ago Acute right-sided low back pain with right-sided sciatica   [facility], Dionne Bucy, MD   1 year ago Acute bilateral low back pain without sciatica   [facility] Trinna Post, [place]       Future Appointments             In 1 month Bacigalupo, Dionne Bucy, MD [facility], [place]

## 2021-01-25 NOTE — Progress Notes (Signed)
Please let pt know this was normal.

## 2021-01-31 ENCOUNTER — Other Ambulatory Visit: Payer: Self-pay | Admitting: Family Medicine

## 2021-01-31 ENCOUNTER — Telehealth: Payer: Self-pay

## 2021-01-31 NOTE — Telephone Encounter (Signed)
Copied from [place] [id number]. Topic: Appointment Scheduling - Scheduling Inquiry for Clinic >> Jan 31, 2021  9:22 AM Oneta Rack wrote: Reason for CRM: Patient was seen by Dr. Neomia Dear at [facility] on 01/27/2021 and Dr. Neomia Dear advised patient to follow up with PCP prior to September. PCP has no available appointments. Patient would like like a follow up response regarding what PCP advises, please respond through My CHART.

## 2021-01-31 NOTE — Telephone Encounter (Signed)
[place] faxed refill request for the following medications:   ALPRAZolam (XANAX) 0.25 MG tablet    Please advise.

## 2021-02-01 MED ORDER — ALPRAZOLAM 0.25 MG PO TABS: 0.2500 mg | ORAL_TABLET | Freq: Every day | ORAL | 1 refills | Status: DC | PRN

## 2021-02-01 NOTE — Telephone Encounter (Signed)
Maybe someone else has something available prior to that. We can look

## 2021-02-02 NOTE — Telephone Encounter (Signed)
Responded to patient through my chart. KW

## 2021-02-07 LAB — BASIC METABOLIC PANEL
Creatinine: 0.7 (ref 0.5–1.1)
Glucose: 89

## 2021-02-07 LAB — LIPID PANEL
Cholesterol: 161 (ref 0–200)
HDL: 39 (ref 35–70)
LDL Cholesterol: 94
Triglycerides: 162 — AB (ref 40–160)

## 2021-02-07 LAB — HEMOGLOBIN A1C: Hemoglobin A1C: 5.6

## 2021-02-07 LAB — COMPREHENSIVE METABOLIC PANEL: GFR calc non Af Amer: 107

## 2021-03-12 ENCOUNTER — Ambulatory Visit: Payer: Self-pay | Admitting: Family Medicine

## 2021-03-15 ENCOUNTER — Encounter: Payer: Self-pay | Admitting: Family Medicine

## 2021-03-15 ENCOUNTER — Other Ambulatory Visit: Payer: Self-pay

## 2021-03-15 ENCOUNTER — Ambulatory Visit (INDEPENDENT_AMBULATORY_CARE_PROVIDER_SITE_OTHER): Payer: BC Managed Care – PPO | Admitting: Family Medicine

## 2021-03-15 VITALS — BP 122/74 | HR 67 | Temp 98.7°F | Resp 16 | Ht 64.0 in | Wt 193.2 lb

## 2021-03-15 DIAGNOSIS — Z0000 Encounter for general adult medical examination without abnormal findings: Secondary | ICD-10-CM

## 2021-03-15 DIAGNOSIS — Z1159 Encounter for screening for other viral diseases: Secondary | ICD-10-CM

## 2021-03-15 DIAGNOSIS — Z124 Encounter for screening for malignant neoplasm of cervix: Secondary | ICD-10-CM

## 2021-03-15 DIAGNOSIS — E538 Deficiency of other specified B group vitamins: Secondary | ICD-10-CM | POA: Diagnosis not present

## 2021-03-15 DIAGNOSIS — Z23 Encounter for immunization: Secondary | ICD-10-CM | POA: Diagnosis not present

## 2021-03-15 DIAGNOSIS — I10 Essential (primary) hypertension: Secondary | ICD-10-CM

## 2021-03-15 DIAGNOSIS — Z1211 Encounter for screening for malignant neoplasm of colon: Secondary | ICD-10-CM

## 2021-03-15 DIAGNOSIS — F419 Anxiety disorder, unspecified: Secondary | ICD-10-CM

## 2021-03-15 DIAGNOSIS — E559 Vitamin D deficiency, unspecified: Secondary | ICD-10-CM | POA: Insufficient documentation

## 2021-03-15 NOTE — Assessment & Plan Note (Signed)
-   Chronic, previously stable - Will defer labs until next visit - Continue vitamin D supplement

## 2021-03-15 NOTE — Assessment & Plan Note (Signed)
-   Chronic, previously stable - Will defer labs until next visit - Continue vitamin B12 supplement

## 2021-03-15 NOTE — Progress Notes (Signed)
Annual Wellness Visit     Patient: Dana Livingston, Female    DOB: [birth year], 50 y.o.   MRN: [id number] Visit Date: 03/15/2021  Today's Provider: Lavon Paganini, MD   Chief Complaint  Patient presents with   Annual Exam   Subjective    [name] is a 50 y.o. female who presents today for her Annual Wellness Visit. She reports consuming a general diet. The patient does not participate in regular exercise at present. She generally feels well. She reports sleeping well. She does have additional problems to discuss today.   HPI  Hypertension - Currently taking Amlodipine - Denies leg swelling, chest pain, SOB, headaches, & vision changes  Anxiety - Pt. 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 states that symptoms improved w/ Zyrtec  Health Maintenance - Due for colonoscopy, pap smear, mammo, & shingles vaccine - Declines mammo, but open to reading "literature" about it - Willing to talk about Cologuard - Wants to get a GYN referral - Wants to get Shingles vaccine   Medications: Outpatient Medications Prior to Visit  Medication Sig   ALPRAZolam (XANAX) 0.25 MG tablet Take 1 tablet (0.25 mg total) by mouth daily as needed for anxiety.   amLODipine (NORVASC) 5 MG tablet Take 1 tablet (5 mg total) by mouth daily.   ASHWAGANDHA PO Take 2 capsules by mouth daily. 1300 mg daily   cetirizine (ZYRTEC) 10 MG tablet Take 10 mg by mouth daily.   Chaste Tree (VITEX EXTRACT PO) Take 2 capsules by mouth daily.    Cholecalciferol (VITAMIN D) 2000 units CAPS Take 1 capsule by mouth daily.    Cyanocobalamin (B-12 SL) Place under  the tongue.    IRON PO Take by mouth.    Melatonin 2.5 MG CHEW Chew by mouth.   Multiple Vitamin (MULTIVITAMIN) capsule Take 1 capsule by mouth daily.    Omega-3 Fatty Acids (OMEGA 3 500) 500 MG CAPS Take 525 mg by mouth. VEGAN   PASSION FLOWER-VALERIAN PO Take by mouth.    Selenium 200 MCG CAPS Take 1 capsule by mouth every other day.   TURMERIC PO Take 2 tablets by mouth daily.    albuterol (VENTOLIN HFA) 108 (90 Base) MCG/ACT inhaler INHALE 2 PUFFS INTO THE LUNGS EVERY 6 HOURS AS NEEDED FOR WHEEZING OR SHORTNESS OF BREATH (Patient not taking: Reported on 03/15/2021)   benzonatate (TESSALON) 100 MG capsule Take 1 capsule (100 mg total) by mouth 2 (two) times daily as needed for cough. (Patient not taking: Reported on 03/15/2021)   doxylamine, Sleep, (UNISOM) 25 MG tablet Take 25 mg by mouth at bedtime as needed. (Patient not taking: Reported on 03/15/2021)   fexofenadine (ALLEGRA ALLERGY) 180 MG tablet Take 1 tablet (180 mg total) by mouth daily. (Patient not taking: Reported on 03/15/2021)   levalbuterol (XOPENEX HFA) 45 MCG/ACT inhaler Inhale 1-2 puffs into the lungs every 4 (four) hours as needed for wheezing. (Patient not taking: Reported on 03/15/2021)   No facility-administered medications prior to visit.    Allergies  Allergen Reactions   Ciprofloxacin Palpitations   Toradol [Ketorolac Tromethamine] Anaphylaxis, Shortness Of Breath and Swelling   Tetracyclines &  Related Nausea And Vomiting   Other Other (See Comments)    Cats cause sneezing    Patient Care Team: Virginia Crews, MD as PCP - General (Family Medicine)  Review of Systems  Constitutional:  Negative for activity change, appetite change, chills, fatigue and fever.  HENT: Negative.    Eyes: Negative.   Respiratory:  Positive for cough and chest tightness.   Cardiovascular: Negative.  Negative for chest pain and leg swelling.  Gastrointestinal: Negative.   Endocrine: Negative.   Genitourinary: Negative.    Musculoskeletal: Negative.   Skin: Negative.   Neurological: Negative.   Psychiatric/Behavioral: Negative.        Objective    Vitals: BP 122/74 (BP Location: Left Arm, Patient Position: Sitting, Cuff Size: Large)   Pulse 67   Temp 98.7 F (37.1 C) (Oral)   Resp 16   Ht '5\' 4"'$  (1.626 m)   Wt 193 lb 3.2 oz (87.6 kg)   LMP 03/15/2021 (Exact Date)   BMI 33.16 kg/m     Physical Exam Constitutional:      General: She is not in acute distress.    Appearance: Normal appearance. She is obese.  HENT:     Head: Normocephalic and atraumatic.     Right Ear: External ear normal.     Left Ear: External ear normal.  Eyes:     Conjunctiva/sclera: Conjunctivae normal.  Cardiovascular:     Rate and Rhythm: Normal rate and regular rhythm.     Pulses: Normal pulses.     Heart sounds: Normal heart sounds.  Pulmonary:     Effort: Pulmonary effort is normal.     Breath sounds: Normal breath sounds.  Chest:     Comments: Breasts: breasts appear normal, no suspicious masses, no skin or nipple changes or axillary nodes  Abdominal:     General: Abdomen is flat. Bowel sounds are normal.     Palpations: Abdomen is soft.  Skin:    General: Skin is warm and dry.  Neurological:     Mental Status: She is alert.    Most recent functional status assessment: In your present state of health, do you have any difficulty performing the following activities: 03/15/2021  Hearing? N  Vision? N  Difficulty concentrating or making decisions? N  Walking or climbing stairs? N  Dressing or bathing? N  Doing errands, shopping? N  Some recent data might be hidden   Most recent fall risk assessment: Fall Risk  03/15/2021  Falls in the past year? 0  Number falls in past yr: 0  Injury with Fall? 0  Risk for fall due to : No Fall Risks  Follow up Falls evaluation completed    Most recent depression screenings: PHQ 2/9 Scores 03/15/2021 01/24/2021  PHQ - 2 Score 0 0  PHQ- 9 Score 0 -   Most recent  cognitive screening: No flowsheet data found. Most recent Audit-C alcohol use screening Alcohol Use Disorder Test (AUDIT) 03/15/2021  1. How often do you have a drink containing alcohol? 0  2. How many drinks containing alcohol do you have on a typical day when you are drinking? 0  3. How often do you have six or more drinks on one occasion? 0  AUDIT-C Score 0  Alcohol Brief Interventions/Follow-up -   A score of 3 or more in women, and 4 or more in men indicates increased risk for alcohol abuse, EXCEPT if all of the points are from question 1   No results  found for any visits on 03/15/21.  Assessment & Plan     Annual wellness visit done today including the all of the following: Reviewed patient's Family Medical History Reviewed and updated list of patient's medical providers Assessment of cognitive impairment was done Assessed patient's functional ability Established a written schedule for health screening [place] Completed and Reviewed  Exercise Activities and Dietary recommendations  Goals   None     Immunization History  Administered Date(s) Administered   Influenza,inj,Quad PF,6+ Mos 03/11/2019, 03/13/2020   PFIZER(Purple Top)SARS-COV-2 Vaccination 08/26/2019, 09/16/2019   Tdap 03/04/2018    Health Maintenance  Topic Date Due   COLONOSCOPY (Pts 45-66yr Insurance coverage will need to be confirmed)  Never done   MAMMOGRAM  Never done   Zoster Vaccines- Shingrix (1 of 2) Never done   PAP SMEAR-Modifier  01/18/2020   COVID-19 Vaccine (3 - Booster for Pfizer series) 02/16/2020   INFLUENZA VACCINE  02/12/2021   TETANUS/TDAP  03/04/2028   Hepatitis C Screening  Completed   HIV Screening  Completed   Pneumococcal Vaccine [id number] old  Aged Out   HPV VACCINES  Aged Out     Discussed health benefits of physical activity, and encouraged her to engage in regular exercise appropriate for her age and condition.    Problem List Items Addressed  This Visit       Cardiovascular and Mediastinum   Hypertension    - Chronic and well-controlled - Continue Amlodipine - UTD on labs        Other   Anxiety    - Chronic and well-controlled - Continue mindful meditation & breathing exercises - Continue Xanax prn for anxiety attacks - Discussed outpatient therapy referral; pt. declines at this time, but states that she may reconsider if needed in the future      Vitamin D deficiency    - Chronic, previously stable - Will defer labs until next visit - Continue vitamin D supplement      Vitamin B12 deficiency    - Chronic, previously stable - Will defer labs until next visit - Continue vitamin B12 supplement      Other Visit Diagnoses     Encounter for annual physical exam    -  Primary    - Pt. declines mammo at this time; provided pt. with health education materials  - Declines colonoscopy, but amenable to Cologuard  - Requests GYN referral for screening pap smear  - Requests Shingles vaccine today    Relevant Orders   Cologuard   Ambulatory referral to Gynecology   Cervical cancer screening       Relevant Orders   Ambulatory referral to Gynecology   Colon cancer screening        - Pt. declines colonoscopy, states that she is unable to take prep solution due to her vegan diet  - Discussed Cologuard screening; pt. is amenable to having Cologuard    Relevant Orders   Cologuard        Return in about 6 months (around 09/12/2021) for chronic disease f/u.       MPercell Locus MS3    Patient seen along with MS3 student MPercell Locus I personally evaluated this patient along with the student, and verified all aspects of the history, physical exam, and medical decision making as documented by the student. I agree with the student's documentation and have made all necessary edits.  Farran Amsden, ADionne Bucy MD, MPH [facility]

## 2021-03-15 NOTE — Assessment & Plan Note (Signed)
-   Chronic and well-controlled - Continue mindful meditation & breathing exercises - Continue Xanax prn for anxiety attacks - Discussed outpatient therapy referral; pt. declines at this time, but states that she may reconsider if needed in the future

## 2021-03-15 NOTE — Assessment & Plan Note (Addendum)
-   Chronic and well-controlled - Continue Amlodipine - UTD on labs

## 2021-03-15 NOTE — Patient Instructions (Addendum)
   The CDC recommends two doses of Shingrix (the shingles vaccine) separated by 2 to 6 months for adults age 52 years and older. I recommend checking with your insurance plan regarding coverage for this vaccine.   

## 2021-03-16 ENCOUNTER — Telehealth: Payer: Self-pay

## 2021-03-16 NOTE — Telephone Encounter (Signed)
[facility] referring for annual exam and cervical screening. Called and left voicemail for patient to call back to be scheduled.

## 2021-03-21 NOTE — Telephone Encounter (Signed)
Called and left voicemail for patient to call back to be scheduled. 

## 2021-03-22 NOTE — Telephone Encounter (Signed)
Called and left voicemail for patient to call back to be scheduled. 

## 2021-03-26 ENCOUNTER — Encounter: Payer: Self-pay | Admitting: Family Medicine

## 2021-03-26 DIAGNOSIS — F419 Anxiety disorder, unspecified: Secondary | ICD-10-CM

## 2021-04-01 ENCOUNTER — Other Ambulatory Visit: Payer: Self-pay | Admitting: Family Medicine

## 2021-04-05 DIAGNOSIS — F4310 Post-traumatic stress disorder, unspecified: Secondary | ICD-10-CM | POA: Diagnosis not present

## 2021-04-09 ENCOUNTER — Ambulatory Visit: Payer: BC Managed Care – PPO | Admitting: Obstetrics & Gynecology

## 2021-04-11 DIAGNOSIS — Z1211 Encounter for screening for malignant neoplasm of colon: Secondary | ICD-10-CM | POA: Diagnosis not present

## 2021-04-12 DIAGNOSIS — F4310 Post-traumatic stress disorder, unspecified: Secondary | ICD-10-CM | POA: Diagnosis not present

## 2021-04-15 LAB — COLOGUARD: Cologuard: NEGATIVE

## 2021-04-16 NOTE — Progress Notes (Signed)
Negative cologuard; repeat in 3 years or if symptoms arise.  Please let us know if you have any questions.  Thank you,  Tally Joe, FNP

## 2021-04-18 DIAGNOSIS — F4310 Post-traumatic stress disorder, unspecified: Secondary | ICD-10-CM | POA: Diagnosis not present

## 2021-04-25 DIAGNOSIS — F4310 Post-traumatic stress disorder, unspecified: Secondary | ICD-10-CM | POA: Diagnosis not present

## 2021-05-01 DIAGNOSIS — M79671 Pain in right foot: Secondary | ICD-10-CM | POA: Diagnosis not present

## 2021-05-01 DIAGNOSIS — M79672 Pain in left foot: Secondary | ICD-10-CM | POA: Diagnosis not present

## 2021-05-03 DIAGNOSIS — F4310 Post-traumatic stress disorder, unspecified: Secondary | ICD-10-CM | POA: Diagnosis not present

## 2021-05-10 DIAGNOSIS — F4310 Post-traumatic stress disorder, unspecified: Secondary | ICD-10-CM | POA: Diagnosis not present

## 2021-05-15 ENCOUNTER — Other Ambulatory Visit: Payer: Self-pay

## 2021-05-15 ENCOUNTER — Encounter: Payer: Self-pay | Admitting: Obstetrics

## 2021-05-15 ENCOUNTER — Ambulatory Visit (INDEPENDENT_AMBULATORY_CARE_PROVIDER_SITE_OTHER): Payer: BC Managed Care – PPO | Admitting: Obstetrics

## 2021-05-15 VITALS — BP 142/80 | HR 82 | Ht 64.0 in | Wt 184.0 lb

## 2021-05-15 DIAGNOSIS — Z124 Encounter for screening for malignant neoplasm of cervix: Secondary | ICD-10-CM | POA: Diagnosis not present

## 2021-05-15 DIAGNOSIS — N951 Menopausal and female climacteric states: Secondary | ICD-10-CM | POA: Diagnosis not present

## 2021-05-15 DIAGNOSIS — Z01419 Encounter for gynecological examination (general) (routine) without abnormal findings: Secondary | ICD-10-CM

## 2021-05-15 NOTE — Progress Notes (Signed)
Gynecology Annual Exam  PCP: Virginia Crews, MD  Chief Complaint:  Chief Complaint  Patient presents with   Gynecologic Exam    Annual - discuss menopause. RM 5    History of Present Illness:Patient is a 50 y.o. G0P0000 presents for annual exam. The patient has concerns about perimenopausal symptoms that have been bothering her this year. She has sleep disturbance, increased anxiety, and irregular cycles. 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  LMP: No LMP recorded. Patient is perimenopausal. Menarche:9 Average Interval: irregular,  She has been getting a period every other month recently   Duration of flow: 7 days Heavy Menses: she has several heavier days when she cycles Clots: no Intermenstrual Bleeding: no Postcoital Bleeding: no Dysmenorrhea: no  The patient is sexually active. She shares that frequency of intercourse is low.She denies dyspareunia.  The patient does not perform self breast exams.  There is no notable family history of breast or ovarian cancer in her family. She has declined having mammograms, and reports that she is not convinced that the radiation exposure and information received with this testing is valuable.  The patient wears seatbelts: yes.   The patient has regular exercise: no.    The patient denies current symptoms of depression.     Review of Systems: Review of Systems  Constitutional: Negative.   HENT: Negative.    Eyes: Negative.   Respiratory: Negative.    Cardiovascular: Negative.   Gastrointestinal: Negative.   Genitourinary: Negative.   Musculoskeletal: Negative.   Skin: Negative.   Neurological: Negative.   Endo/Heme/Allergies: Negative.   Psychiatric/Behavioral:  The patient is nervous/anxious.    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   Elevated cancer antigen 125 (CA-125) 01/18/2015   Fibroids 01/18/2015    Past Surgical History:  Past Surgical History:  Procedure Laterality Date   CHOLECYSTECTOMY  1991   LAPAROSCOPIC OVARIAN CYSTECTOMY Left 1998   LASIK  2001   ROBOTIC ASSISTED LAPAROSCOPIC OVARIAN CYSTECTOMY Left 02/14/2015   Procedure: ROBOTIC ASSISTED DRAINAGE LEFT OVARIAN CYST; FULAGRATION ON PERITONEAL ENDOMETRIOSIS;  Surgeon: [name], MD;  Location: [facility];  Service: Gynecology;  Laterality: Left;   ROBOTIC ASSISTED SALPINGO OOPHERECTOMY Right 02/14/2015   Procedure: ROBOTIC ASSISTED RIGHT SALPINGO OOPHORECTOMY;  Surgeon: [name], MD;  Location: [facility];  Service: Gynecology;  Laterality: Right;   TONSILLECTOMY  1981   WISDOM TOOTH EXTRACTION      Gynecologic History:  No LMP recorded. Patient is perimenopausal. Last Pap: Results were: NILM no abnormalities  Last mammogram: has never  Results were:  NA  Obstetric History: G0P0000  Family History:  Family History  Problem Relation Age of Onset   Thyroid disease Mother    Hypertension Mother    Stroke Mother    Hyperlipidemia Mother        with atherosclerosis   COPD Father        former smoker   Hypertension Father    Heart failure Father        former smoker    Lung cancer Maternal Grandmother    COPD Maternal Grandfather    Colon  cancer Paternal Uncle    Breast cancer Neg Hx    Ovarian cancer Neg Hx    Cervical cancer Neg Hx     Social History:  Social History   Socioeconomic History   Marital status: Married    Spouse name: Grafton Folk   Number of children: 0   Years of education: masters x 2   Highest education level: Not on file  Occupational History    Employer: [employer]  Tobacco Use   Smoking status: Never   Smokeless tobacco: Never  Vaping Use   Vaping Use: Never used   Substance and Sexual Activity   Alcohol use: No   Drug use: No   Sexual activity: Yes    Birth control/protection: None  Other Topics Concern   Not on file  Social History Narrative   Not on file   Social Determinants of Health   Financial Resource Strain: Not on file  Food Insecurity: Not on file  Transportation Needs: Not on file  Physical Activity: Not on file  Stress: Not on file  Social Connections: Not on file  Intimate Partner Violence: Not on file    Allergies:  Allergies  Allergen Reactions   Ciprofloxacin Palpitations   Toradol [Ketorolac Tromethamine] Anaphylaxis, Shortness Of Breath and Swelling   Tetracyclines & Related Nausea And Vomiting   Other Other (See Comments)    Cats cause sneezing    Medications: Prior to Admission medications   Medication Sig Start Date End Date Taking? Authorizing Provider  ALPRAZolam (XANAX) 0.25 MG tablet Take 1 tablet (0.25 mg total) by mouth daily as needed for anxiety. 02/01/21  Yes Bacigalupo, Dionne Bucy, MD  amLODipine (NORVASC) 5 MG tablet TAKE 1 TABLET(5 MG) BY MOUTH DAILY 04/02/21  Yes Bacigalupo, Dionne Bucy, MD  ASHWAGANDHA PO Take 2 capsules by mouth daily. 1300 mg daily   Yes [provider]  cetirizine (ZYRTEC) 10 MG tablet Take 10 mg by mouth daily.   Yes [provider]  Chaste Tree (VITEX EXTRACT PO) Take 2 capsules by mouth daily.    Yes [provider]  Cholecalciferol (VITAMIN D) 2000 units CAPS Take 1 capsule by mouth daily.    Yes [provider]  Cyanocobalamin (B-12 SL) Place under the tongue.    Yes [provider]  IRON PO Take by mouth.    Yes [provider]  Melatonin 2.5 MG CHEW Chew by mouth.   Yes [provider]  Multiple Vitamin (MULTIVITAMIN) capsule Take 1 capsule by mouth daily.    Yes [provider]  Omega-3 Fatty Acids (OMEGA 3 500) 500 MG CAPS Take 525 mg by mouth. VEGAN   Yes [provider]  PASSION  FLOWER-VALERIAN PO Take by mouth.    Yes [provider]  Selenium 200 MCG CAPS Take 1 capsule by mouth every other day.   Yes [provider]  TURMERIC PO Take 2 tablets by mouth daily.    Yes [provider]  doxylamine, Sleep, (UNISOM) 25 MG tablet Take 25 mg by mouth at bedtime as needed. Patient not taking: Reported on 03/15/2021    [provider]    Physical Exam Vitals: Blood pressure (!) 142/80, pulse 82, height 5\' 4"  (1.626 m), weight 184 lb (83.5 kg).  General: NAD HEENT: normocephalic, anicteric Thyroid: no enlargement, no palpable nodules Pulmonary: No increased work of breathing, CTAB Cardiovascular: RRR, distal pulses 2+ Breast: Breast symmetrical, no tenderness, no palpable nodules or masses, no skin or  nipple retraction present, no nipple discharge.  No axillary or supraclavicular lymphadenopathy. Abdomen: NABS, soft, non-tender, non-distended.  Umbilicus without lesions.  No hepatomegaly, splenomegaly or masses palpable. No evidence of hernia  Genitourinary:  External: Normal external female genitalia.  Normal urethral meatus, normal Bartholin's and Skene's glands.    Vagina: Normal vaginal mucosa, no evidence of prolapse.    Cervix: Grossly normal in appearance, no bleeding  Uterus: Non-enlarged, anteverted, mobile, normal contour.  No CMT  Adnexa: ovaries non-enlarged, no adnexal masses  Rectal: deferred  Lymphatic: no evidence of inguinal lymphadenopathy Extremities: no edema, erythema, or tenderness Neurologic: Grossly intact Psychiatric: mood appropriate, affect full  Female chaperone present for pelvic and breast  portions of the physical exam     Assessment: 50 y.o. G0P0000 routine annual exam Perimenopausal symptoms Due for a pap smear. No mammogram baseline- declines routine screening. Plan: Problem List Items Addressed This Visit   None Visit Diagnoses     Cervical cancer screening    -  Primary   Relevant Orders    IGP, Aptima HPV       1) Mammogram - recommend yearly screening mammogram.  Mammogram  She declines and continues to decline getting a basline  2) STI screening  was offered and declined  3) ASCCP guidelines and rational discussed.  Patient opts for every 5 years screening interval  4) Osteoporosis  - per USPTF routine screening DEXA at age 49   Consider FDA-approved medical therapies in postmenopausal women and men aged 36 years and older, based on the following: a) A hip or vertebral (clinical or morphometric) fracture b) T-score ? -2.5 at the femoral neck or spine after appropriate evaluation to exclude secondary causes C) Low bone mass (T-score between -1.0 and -2.5 at the femoral neck or spine) and a 10-year probability of a hip fracture ? 3% or a 10-year probability of a major osteoporosis-related fracture ? 20% based on the [place]-adapted WHO algorithm   5) Routine healthcare maintenance including cholesterol, diabetes screening discussed managed by PCP  6) Colonoscopy per PCP.  Screening recommended starting at age 49 for average risk individuals, age 75 for individuals deemed at increased risk (including African Americans) and recommended to continue until age 61.  For patient age 53-85 individualized approach is recommended.  Gold standard screening is via colonoscopy, Cologuard screening is an acceptable alternative for patient unwilling or unable to undergo colonoscopy.  "Colorectal cancer screening for average?risk adults: 2018 guideline update from the American Cancer Society"[place]: A Cancer Journal for Clinicians: Dec 11, 2016   7) We discussed her hot flashes, and she plans to make an appointment with one of the [facility] to discuss treatment.  Her blood pressure was addressed today- She sees a PCP and is on medication for HTN.  Follow up in one year for her next annual.    Imagene Riches, CNM  05/15/2021 5:29 PM   Mosetta Pigeon, [facility] 05/15/2021, 5:29  PM

## 2021-05-17 DIAGNOSIS — F4310 Post-traumatic stress disorder, unspecified: Secondary | ICD-10-CM | POA: Diagnosis not present

## 2021-05-18 LAB — IGP, APTIMA HPV: HPV Aptima: NEGATIVE

## 2021-05-24 ENCOUNTER — Encounter: Payer: Self-pay | Admitting: Obstetrics

## 2021-05-24 DIAGNOSIS — F4310 Post-traumatic stress disorder, unspecified: Secondary | ICD-10-CM | POA: Diagnosis not present

## 2021-05-31 ENCOUNTER — Encounter: Payer: Self-pay | Admitting: Obstetrics & Gynecology

## 2021-05-31 ENCOUNTER — Other Ambulatory Visit: Payer: Self-pay

## 2021-05-31 ENCOUNTER — Ambulatory Visit: Payer: BC Managed Care – PPO | Admitting: Obstetrics & Gynecology

## 2021-05-31 VITALS — Ht 64.0 in | Wt 183.0 lb

## 2021-05-31 DIAGNOSIS — Z780 Asymptomatic menopausal state: Secondary | ICD-10-CM | POA: Diagnosis not present

## 2021-05-31 DIAGNOSIS — F4310 Post-traumatic stress disorder, unspecified: Secondary | ICD-10-CM | POA: Diagnosis not present

## 2021-05-31 MED ORDER — NORETHIN-ETH ESTRAD-FE BIPHAS 1 MG-10 MCG / 10 MCG PO TABS: 1.0000 | ORAL_TABLET | Freq: Every day | ORAL | 3 refills | Status: DC

## 2021-05-31 NOTE — Progress Notes (Signed)
HPI:      [name] is a 50 y.o. G0P0000 who is perimenopausal, presents today for a problem visit.  She complains of anxiety, hot flashes, insomnia, moodiness, no energy, vaginal dryness.  Also acne.  Anxiety, takes Xanax occasionally.  Insomnia, takes Unisom to help, but still wakes up wet w sweat.   Symptoms have been present for several months. Symptoms are mod to severe and has led her to come in today to seek options for intervention.  Periods are reg although skips some months    Menarche age 33. Previous Treatment: None Prior OCPs age [age]. No side effects. 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 Also,  has a past surgical history that includes Cholecystectomy (1991); Tonsillectomy (1981); Laparoscopic ovarian cystectomy (Left, 1998); Wisdom tooth extraction; LASIK (2001); Robotic assisted salpingo oophorectomy (Right, 02/14/2015); and Robotic assisted laparoscopic ovarian cystectomy (Left, 02/14/2015)., family history includes COPD in her father and maternal grandfather; Colon cancer in her paternal uncle; Heart failure in her father; Hyperlipidemia in her mother; Hypertension in her father and mother; Lung cancer in her maternal grandmother; Stroke in her mother; Thyroid disease in her mother.,  reports that she has never smoked. She has never used smokeless tobacco. She reports that she does not drink alcohol and does not use drugs.  She has a current medication list which includes the following prescription(s): alprazolam, amlodipine, ashwagandha, chaste tree, vitamin d, cyanocobalamin, doxylamine (sleep), ferrous sulfate, melatonin, multivitamin, omega 3 500, passion flower-valerian, selenium, turmeric, cetirizine, and norethindrone-ethinyl estradiol-fe biphas. Also, is allergic  to ciprofloxacin, toradol [ketorolac tromethamine], tetracyclines & related, and other.  Review of Systems  Constitutional:  Negative for chills, fever and malaise/fatigue.  HENT:  Negative for congestion, sinus pain and sore throat.   Eyes:  Negative for blurred vision and pain.  Respiratory:  Negative for cough and wheezing.   Cardiovascular:  Negative for chest pain and leg swelling.  Gastrointestinal:  Negative for abdominal pain, constipation, diarrhea, heartburn, nausea and vomiting.  Genitourinary:  Negative for dysuria, frequency, hematuria and urgency.  Musculoskeletal:  Negative for back pain, joint pain, myalgias and neck pain.  Skin:  Negative for itching and rash.  Neurological:  Negative for dizziness, tremors and weakness.  Endo/Heme/Allergies:  Does not bruise/bleed easily.  Psychiatric/Behavioral:  Negative for depression. The patient is not nervous/anxious and does not have insomnia.    Objective: Ht 5\' 4"  (1.626 m)   Wt 183 lb (83 kg)   BMI 31.41 kg/m  Physical Exam Constitutional:      General: She is not in acute distress.    Appearance: She is well-developed.  Musculoskeletal:        General: Normal range of motion.  Neurological:     Mental Status: She is alert and oriented to person, place, and time.  Skin:    General: Skin is warm and dry.  Vitals reviewed.    ASSESSMENT/PLAN:  Menopause. Patient with bothersome menopausal vasomotor symptoms. Discussed lifestyle interventions such as wearing light clothing, remaining in cool environments, having fan/air conditioner in the room, avoiding hot beverages etc.  Exercise also shown to be significantly helpful in alleviating hot flashes.  Discussed using hormone therapy and concerns about increased risk of heart disease, cerebrovascular disease, thromboembolic disease,  and breast cancer.  Also discussed other medical options  such as Clonidine, SSRI, or Neurontin.   Also discussed alternative therapies such as  herbal remedies but cautioned that most of the products contained phytoestrogens (plant estrogens) in unregulated amounts which can have the same effects on the body as the pharmaceutical estrogen preparations.  Patient opted for OCP therapy for now, as she is still regularly menstruating. She will return in 2 months for re-evaluation.   Barnett Applebaum, MD, [facility], [facility] 05/31/2021  4:30 PM

## 2021-05-31 NOTE — Patient Instructions (Signed)

## 2021-06-14 DIAGNOSIS — F4310 Post-traumatic stress disorder, unspecified: Secondary | ICD-10-CM | POA: Diagnosis not present

## 2021-06-25 ENCOUNTER — Other Ambulatory Visit: Payer: Self-pay | Admitting: Family Medicine

## 2021-06-25 NOTE — Telephone Encounter (Signed)
Requested medications are due for refill today.  yes  Requested medications are on the active medications list.  yes  Last refill. 02/01/2021  Future visit scheduled.   yes  Notes to clinic.  Medication not delegated.    Requested Prescriptions  Pending Prescriptions Disp Refills   ALPRAZolam (XANAX) 0.25 MG tablet [Pharmacy Med Name: ALPRAZOLAM 0.25MG  TABLETS] 30 tablet     Sig: TAKE 1 TABLET(0.25 MG) BY MOUTH DAILY AS NEEDED FOR ANXIETY     Not Delegated - Psychiatry:  Anxiolytics/Hypnotics Failed - 06/25/2021  5:03 PM      Failed - This refill cannot be delegated      Failed - Urine Drug Screen completed in last 360 days      Passed - Valid encounter within last 6 months    Recent Outpatient Visits           3 months ago Encounter for annual physical exam   [facility] [place], Dionne Bucy, MD   5 months ago Chronic cough   [place], [place]   9 months ago Primary hypertension   [facility] [place], Dionne Bucy, MD   1 year ago Encounter for annual physical exam   [facility] [place], Dionne Bucy, MD   1 year ago Acute right-sided low back pain with right-sided sciatica   [facility] Dionne Bucy, MD       Future Appointments             In 1 month Kenton Kingfisher, Linton Ham, MD [facility]   In 2 months Bacigalupo, Dionne Bucy, MD [facility], [place]

## 2021-06-28 DIAGNOSIS — F4310 Post-traumatic stress disorder, unspecified: Secondary | ICD-10-CM | POA: Diagnosis not present

## 2021-07-12 DIAGNOSIS — F4310 Post-traumatic stress disorder, unspecified: Secondary | ICD-10-CM | POA: Diagnosis not present

## 2021-07-26 DIAGNOSIS — F4310 Post-traumatic stress disorder, unspecified: Secondary | ICD-10-CM | POA: Diagnosis not present

## 2021-08-01 ENCOUNTER — Telehealth: Payer: BC Managed Care – PPO | Admitting: Obstetrics & Gynecology

## 2021-08-01 ENCOUNTER — Encounter: Payer: Self-pay | Admitting: Obstetrics & Gynecology

## 2021-08-01 DIAGNOSIS — Z780 Asymptomatic menopausal state: Secondary | ICD-10-CM

## 2021-08-01 NOTE — Progress Notes (Signed)
Virtual Visit via Video Note  I connected with Dana Livingston on 08/01/21 at  3:55 PM EST by a video enabled telemedicine application and verified that I am speaking with the correct person using two identifiers.  Location: Patient: Home Provider: Office   I discussed the limitations of evaluation and management by telemedicine and the availability of in person appointments. The patient expressed understanding and agreed to proceed.  History of Present Illness:   Dana Livingston is a 51 y.o. who was started on OCPs approximately 2 months ago. Since that time, she states that her symptoms are improving.  Improved hot flashes, mood sx's, fatigue, and periods are better.  PMHx: She  has a past medical history of Anxiety, Complication of anesthesia (1989), Fibroids, Hypertension, 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 (2001), and Panic attacks. Also,  has a past surgical history that includes Cholecystectomy (1991); Tonsillectomy (1981); Laparoscopic ovarian cystectomy (Left, 1998); Wisdom tooth extraction; LASIK (2001); Robotic assisted salpingo oophorectomy (Right, 02/14/2015); and Robotic assisted laparoscopic ovarian cystectomy (Left, 02/14/2015)., family history includes COPD in her father and maternal grandfather; Colon cancer in her paternal uncle; Heart failure in her father; Hyperlipidemia in her mother; Hypertension in her father and mother; Lung cancer in her maternal grandmother; Stroke in her mother; Thyroid disease in her mother.,  reports that she has never smoked. She has never used smokeless tobacco. She reports that she does not drink alcohol and does not use drugs. No outpatient medications have been marked as taking for the 08/01/21 encounter (Video Visit) with Gae Dry, MD.  . Also, is allergic to ciprofloxacin, toradol [ketorolac tromethamine], tetracyclines & related, and other..  Review of Systems  All other systems reviewed and are  negative.   Observations/Objective: No exam today, due to telephone [facility]Visit [facility] on [facility] visits and procedures.  Prior visits reviewed along with ultrasounds/labs as indicated.  Assessment and Plan:   ICD-10-CM   1. Menopause  Z78.0     Medication treatment is going very well for her   OCPs for menopause sx control.  Plan: She will undergo no change in her medical therapy. Risks of Estrogen discussed  She was amenable to this plan and we will see her back for annual/PRN. Follow Up Instructions: PRN/ Annual   I discussed the assessment and treatment plan with the patient. The patient was provided an opportunity to ask questions and all were answered. The patient agreed with the plan and demonstrated an understanding of the instructions.   The patient was advised to call back or seek an in-person evaluation if the symptoms worsen or if the condition fails to improve as anticipated.  A total of 15 minutes were spent face-to-face with the patient as well as preparation, review, communication, and documentation during this encounter.   Barnett Applebaum, MD, [facility], [facility] 08/01/2021  4:08 PM

## 2021-08-09 DIAGNOSIS — F4310 Post-traumatic stress disorder, unspecified: Secondary | ICD-10-CM | POA: Diagnosis not present

## 2021-08-23 DIAGNOSIS — F4310 Post-traumatic stress disorder, unspecified: Secondary | ICD-10-CM | POA: Diagnosis not present

## 2021-09-06 DIAGNOSIS — F4310 Post-traumatic stress disorder, unspecified: Secondary | ICD-10-CM | POA: Diagnosis not present

## 2021-09-12 NOTE — Progress Notes (Signed)
?  ? ? ?Established patient visit ? ? ?Patient: Dana Livingston   DOB: July 22, 1970   51 y.o. Female  MRN: [id number] ?Visit Date: 09/13/2021 ? ?Today's healthcare provider: Lavon Paganini, MD  ? ?Chief Complaint  ?Patient presents with  ? Hypertension  ? Anxiety  ? ?Subjective  ?  ?HPI  ?Hypertension, follow-up ? ?BP Readings from Last 3 Encounters:  ?09/13/21 132/74  ?05/15/21 (!) 142/80  ?03/15/21 122/74  ? Wt Readings from Last 3 Encounters:  ?09/13/21 179 lb 8 oz (81.4 kg)  ?05/31/21 183 lb (83 kg)  ?05/15/21 184 lb (83.5 kg)  ?  ? ?She was last seen for hypertension 6 months ago.  ?BP at that visit was 122/74. Management since that visit includes no changes. ? ?She reports excellent compliance with treatment. ?She is not having side effects.  ?She is following a Low Sodium diet. ?She is exercising. ?She does not smoke. ? ?Use of agents associated with hypertension: none.  ? ?Outside blood pressures are not being checked. ?Symptoms: ?No chest pain No chest pressure  ?No palpitations No syncope  ?No dyspnea No orthopnea  ?No paroxysmal nocturnal dyspnea No lower extremity edema  ? ?Pertinent labs: ?Lab Results  ?Component Value Date  ? CHOL 161 02/07/2021  ? HDL 39 02/07/2021  ? [place] 94 02/07/2021  ? TRIG 162 (A) 02/07/2021  ? Lab Results  ?Component Value Date  ? NA 136 09/13/2020  ? K 4.5 09/13/2020  ? CREATININE 0.7 02/07/2021  ? GFRNONAA 107 02/07/2021  ? GLUCOSE 89 09/13/2020  ?  ? ?The 10-year ASCVD risk score (Arnett DK, et al., 2019) is: 2.4%* (Cholesterol units were assumed)  ? ?--------------------------------------------------------------------------------------------------- ?Anxiety, Follow-up ? ?She was last seen for anxiety 6 months ago. ?Changes made at last visit include continue Xanax prn. ?  ?She reports excellent compliance with treatment. ?She reports excellent tolerance of treatment. ?She is not having side effects.  ? ?She feels her anxiety is mild and Improved since last  visit. ? ?Symptoms: ?No chest pain No difficulty concentrating  ?No dizziness No fatigue  ?No feelings of losing control No insomnia  ?No irritable No palpitations  ?No panic attacks No racing thoughts  ?No shortness of breath No sweating  ?No tremors/shakes   ? ?GAD-7 Results ?No flowsheet data found. ? ?PHQ-9 Scores ?PHQ9 SCORE ONLY 09/13/2021 03/15/2021 01/24/2021  ?PHQ-9 Total Score 0 0 0  ? ? ?--------------------------------------------------------------------------------------------------- ?Follow up for vitamin D and B12 deficiency ? ?The patient was last seen for this 6 months ago. ?Changes made at last visit include continue OTC vitamin D and B12. ? ?She reports excellent compliance with treatment. ?She feels that condition is Improved. ?She is not having side effects.  ? ?----------------------------------------------------------------------------------------- ? ?Medications: ?Outpatient Medications Prior to Visit  ?Medication Sig  ? ALPRAZolam (XANAX) 0.25 MG tablet TAKE 1 TABLET(0.25 MG) BY MOUTH DAILY AS NEEDED FOR ANXIETY  ? amLODipine (NORVASC) 5 MG tablet TAKE 1 TABLET(5 MG) BY MOUTH DAILY  ? ASHWAGANDHA PO Take 2 capsules by mouth daily. 1300 mg daily  ? Chaste Tree (VITEX EXTRACT PO) Take 2 capsules by mouth daily.   ? Cholecalciferol (VITAMIN D) 2000 units CAPS Take 1 capsule by mouth daily.   ? Cyanocobalamin (B-12 SL) Place under the tongue.   ? doxylamine, Sleep, (UNISOM) 25 MG tablet Take 25 mg by mouth at bedtime as needed.  ? IRON PO Take by mouth.   ? Melatonin 2.5 MG CHEW Chew by mouth.  ? Multiple Vitamin (  MULTIVITAMIN) capsule Take 1 capsule by mouth daily.   ? Norethindrone-Ethinyl Estradiol-Fe Biphas (LO LOESTRIN FE) 1 MG-10 MCG / 10 MCG tablet Take 1 tablet by mouth daily.  ? Omega-3 Fatty Acids (OMEGA 3 500) 500 MG CAPS Take 525 mg by mouth. VEGAN  ? PASSION FLOWER-VALERIAN PO Take by mouth.   ? Selenium 200 MCG CAPS Take 1 capsule by mouth every other day.  ? TURMERIC PO Take 2 tablets  by mouth daily.   ? ?No facility-administered medications prior to visit.  ? ? ?Review of Systems per HPI ? ?  ?  Objective  ?  ?BP 132/74 (BP Location: Left Arm, Cuff Size: Large)   Pulse 67   Temp 98.6 ?F (37 ?C) (Oral)   Resp 16   Wt 179 lb 8 oz (81.4 kg)   BMI 30.81 kg/m?  ?BP Readings from Last 3 Encounters:  ?09/13/21 132/74  ?05/15/21 (!) 142/80  ?03/15/21 122/74  ? ?Wt Readings from Last 3 Encounters:  ?09/13/21 179 lb 8 oz (81.4 kg)  ?05/31/21 183 lb (83 kg)  ?05/15/21 184 lb (83.5 kg)  ? ?  ? ?Physical Exam ?Vitals reviewed.  ?Constitutional:   ?   General: She is not in acute distress. ?   Appearance: Normal appearance. She is well-developed. She is not diaphoretic.  ?HENT:  ?   Head: Normocephalic and atraumatic.  ?Eyes:  ?   General: No scleral icterus. ?   Conjunctiva/sclera: Conjunctivae normal.  ?Neck:  ?   Thyroid: No thyromegaly.  ?Cardiovascular:  ?   Rate and Rhythm: Normal rate and regular rhythm.  ?   Pulses: Normal pulses.  ?   Heart sounds: Normal heart sounds. No murmur heard. ?Pulmonary:  ?   Effort: Pulmonary effort is normal. No respiratory distress.  ?   Breath sounds: Normal breath sounds. No wheezing, rhonchi or rales.  ?Musculoskeletal:  ?   Cervical back: Neck supple.  ?   Right lower leg: No edema.  ?   Left lower leg: No edema.  ?Lymphadenopathy:  ?   Cervical: No cervical adenopathy.  ?Skin: ?   General: Skin is warm and dry.  ?   Findings: No rash.  ?Neurological:  ?   Mental Status: She is alert and oriented to person, place, and time. Mental status is at baseline.  ?Psychiatric:     ?   Mood and Affect: Mood normal.     ?   Behavior: Behavior normal.  ?  ? ? ?No results found for any visits on 09/13/21. ? Assessment & Plan  ?  ? ?Problem List Items Addressed This Visit   ? ?  ? Cardiovascular and Mediastinum  ? Hypertension - Primary  ?  Well controlled ?Continue current medications ?Recheck metabolic panel ?F/u in 6 months  ?  ?  ? Relevant Orders  ? Basic Metabolic Panel  (BMET)  ?  ? Other  ? Anxiety  ?  Chronic and stable ?Some recent worsening due to family stressors ?Continue therapy ?Continue xanax prn for anxiety attacks ?Patient not interested in SSRIs at this time ?  ?  ? Obesity  ?  Congratulated on weight loss - using intermittent fasting ?Discussed importance of healthy weight management ?Discussed diet and exercise  ?  ?  ? Vitamin D deficiency  ?  Chronic, previously stable ?Continue supplement, recheck level ?  ?  ? Relevant Orders  ? VITAMIN D 25 Hydroxy (Vit-D Deficiency, Fractures)  ? Vitamin B12 deficiency  ?  Chronic, previously stable ?Continue supplement and recheck labs ?  ?  ? Relevant Orders  ? B12  ? ?Other Visit Diagnoses   ? ? Need for shingles vaccine      ? Relevant Orders  ? Varicella-zoster vaccine IM (Completed)  ? ?  ?  ? ?Return in about 6 months (around 03/16/2022) for CPE.  ?   ? ?I, Lavon Paganini, MD, have reviewed all documentation for this visit. The documentation on 09/13/21 for the exam, diagnosis, procedures, and orders are all accurate and complete. ? ? ? ?Virginia Crews, MD, MPH ?[place] ?[facility]   ?

## 2021-09-13 ENCOUNTER — Other Ambulatory Visit: Payer: Self-pay

## 2021-09-13 ENCOUNTER — Encounter: Payer: Self-pay | Admitting: Family Medicine

## 2021-09-13 ENCOUNTER — Ambulatory Visit: Payer: BC Managed Care – PPO | Admitting: Family Medicine

## 2021-09-13 VITALS — BP 132/74 | HR 67 | Temp 98.6°F | Resp 16 | Wt 179.5 lb

## 2021-09-13 DIAGNOSIS — F419 Anxiety disorder, unspecified: Secondary | ICD-10-CM

## 2021-09-13 DIAGNOSIS — Z23 Encounter for immunization: Secondary | ICD-10-CM

## 2021-09-13 DIAGNOSIS — Z6830 Body mass index (BMI) 30.0-30.9, adult: Secondary | ICD-10-CM

## 2021-09-13 DIAGNOSIS — E669 Obesity, unspecified: Secondary | ICD-10-CM | POA: Diagnosis not present

## 2021-09-13 DIAGNOSIS — I10 Essential (primary) hypertension: Secondary | ICD-10-CM

## 2021-09-13 DIAGNOSIS — E538 Deficiency of other specified B group vitamins: Secondary | ICD-10-CM

## 2021-09-13 DIAGNOSIS — E559 Vitamin D deficiency, unspecified: Secondary | ICD-10-CM

## 2021-09-13 NOTE — Assessment & Plan Note (Signed)
Chronic and stable ?Some recent worsening due to family stressors ?Continue therapy ?Continue xanax prn for anxiety attacks ?Patient not interested in SSRIs at this time ?

## 2021-09-13 NOTE — Assessment & Plan Note (Addendum)
Well controlled Continue current medications Recheck metabolic panel F/u in 6 months  

## 2021-09-13 NOTE — Assessment & Plan Note (Signed)
Congratulated on weight loss - using intermittent fasting ?Discussed importance of healthy weight management ?Discussed diet and exercise  ?

## 2021-09-13 NOTE — Assessment & Plan Note (Signed)
Chronic, previously stable ?Continue supplement, recheck level ?

## 2021-09-13 NOTE — Assessment & Plan Note (Signed)
Chronic, previously stable ?Continue supplement and recheck labs ?

## 2021-09-14 LAB — BASIC METABOLIC PANEL
BUN/Creatinine Ratio: 10 (ref 9–23)
BUN: 7 mg/dL (ref 6–24)
CO2: 21 mmol/L (ref 20–29)
Calcium: 9.4 mg/dL (ref 8.7–10.2)
Chloride: 102 mmol/L (ref 96–106)
Creatinine, Ser: 0.69 mg/dL (ref 0.57–1.00)
Glucose: 89 mg/dL (ref 70–99)
Potassium: 4.1 mmol/L (ref 3.5–5.2)
Sodium: 138 mmol/L (ref 134–144)
eGFR: 104 mL/min/{1.73_m2} (ref >59)

## 2021-09-14 LAB — VITAMIN D 25 HYDROXY (VIT D DEFICIENCY, FRACTURES): Vit D, 25-Hydroxy: 45 ng/mL (ref 30.0–100.0)

## 2021-09-14 LAB — VITAMIN B12: Vitamin B-12: 502 pg/mL (ref 232–1245)

## 2021-09-20 DIAGNOSIS — F4310 Post-traumatic stress disorder, unspecified: Secondary | ICD-10-CM | POA: Diagnosis not present

## 2021-10-01 ENCOUNTER — Other Ambulatory Visit: Payer: Self-pay | Admitting: Family Medicine

## 2021-10-04 DIAGNOSIS — F4310 Post-traumatic stress disorder, unspecified: Secondary | ICD-10-CM | POA: Diagnosis not present

## 2021-10-18 DIAGNOSIS — F4310 Post-traumatic stress disorder, unspecified: Secondary | ICD-10-CM | POA: Diagnosis not present

## 2021-10-31 ENCOUNTER — Encounter: Payer: Self-pay | Admitting: Obstetrics and Gynecology

## 2021-10-31 ENCOUNTER — Ambulatory Visit: Payer: BC Managed Care – PPO | Admitting: Obstetrics and Gynecology

## 2021-10-31 VITALS — BP 130/90 | Ht 64.0 in | Wt 183.0 lb

## 2021-10-31 DIAGNOSIS — Z780 Asymptomatic menopausal state: Secondary | ICD-10-CM

## 2021-10-31 DIAGNOSIS — N951 Menopausal and female climacteric states: Secondary | ICD-10-CM | POA: Diagnosis not present

## 2021-10-31 DIAGNOSIS — N921 Excessive and frequent menstruation with irregular cycle: Secondary | ICD-10-CM | POA: Diagnosis not present

## 2021-10-31 MED ORDER — NORETHINDRONE-ETH ESTRADIOL 0.5-2.5 MG-MCG PO TABS: 1.0000 | ORAL_TABLET | Freq: Every day | ORAL | 0 refills | Status: DC

## 2021-10-31 NOTE — Progress Notes (Signed)
? ? ?Bacigalupo, Dionne Bucy, MD ? ? ?Chief Complaint  ?Patient presents with  ? Follow-up  ?  BC is giving a lot of spotting, would like to switch to something that will make it stop  ? ? ?HPI: ?     [name] is a 51 y.o. G0P0000 whose LMP was No LMP recorded. Patient is perimenopausal., presents today for f/u on OCP start 11/22 with Dr .Kenton Kingfisher for vasomotor sx in perimenopause. Menses were Q1-3 months, lasting 7 days prior to OCPs. Pt with significant hot flashes and cold flashes, sleep disturbance and increased anxiety. Started on Lo Loestrin. Perimenopausal sx significantly improved but having BTB still with OCPs. Menses are monthly on placebo pills, lasting 3-4 days of flow, but BTB throughout active pills, mild dysmen. Hx of BTB on OCPs in past. Pt was told she had leio on GYN u/s in distant past. No issues with it in past. Hx of endometrioma. ?She is rarely sexually active. ?Neg pap/neg HPV DNA 11/22.  ?Pt with hx of HTN, although improved with wt loss. Has white coat HTN.  ? ?Patient Active Problem List  ? Diagnosis Date Noted  ? Vitamin D deficiency 03/15/2021  ? Vitamin B12 deficiency 03/15/2021  ? Grieving 09/11/2020  ? Hyperpigmentation 09/11/2020  ? 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 Obesity 03/26/2017  ? Hypertension   ? Anxiety   ? Panic attacks   ? 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 Elevated cancer antigen 125 (CA-125) 01/18/2015  ? Fibroids 01/18/2015  ? ? ?Past Surgical History:  ?Procedure Laterality Date  ? CHOLECYSTECTOMY  1991  ? LAPAROSCOPIC OVARIAN CYSTECTOMY Left 1998  ? LASIK  2001  ? ROBOTIC ASSISTED LAPAROSCOPIC OVARIAN CYSTECTOMY Left 02/14/2015  ? Procedure: ROBOTIC ASSISTED DRAINAGE LEFT OVARIAN CYST; FULAGRATION ON PERITONEAL ENDOMETRIOSIS;  Surgeon: [name], MD;  Location: [facility];  Service: Gynecology;  Laterality: Left;  ? ROBOTIC ASSISTED SALPINGO OOPHERECTOMY Right 02/14/2015  ? Procedure: ROBOTIC ASSISTED RIGHT SALPINGO OOPHORECTOMY;  Surgeon: [name], MD;   Location: [facility];  Service: Gynecology;  Laterality: Right;  ? TONSILLECTOMY  1981  ? WISDOM TOOTH EXTRACTION    ? ? ?Family History  ?Problem Relation Age of Onset  ? Thyroid disease Mother   ? Hypertension Mother   ? Stroke Mother   ? Hyperlipidemia Mother   ?     with atherosclerosis  ? COPD Father   ?     former smoker  ? Hypertension Father   ? Heart failure Father   ?     former smoker   ? Colon cancer Paternal Uncle   ? Lung cancer Maternal Grandmother   ? COPD Maternal Grandfather   ? Breast cancer Neg Hx   ? Ovarian cancer Neg Hx   ? Cervical cancer Neg Hx   ? ? ?Social History  ? ?Socioeconomic History  ? Marital status: Married  ?  Spouse name: Grafton Folk  ? Number of children: 0  ? Years of education: masters x 2  ? Highest education level: Not on file  ?Occupational History  ?  Employer: [employer]  ?Tobacco Use  ? Smoking status: Never  ? Smokeless tobacco: Never  ?Vaping Use  ? Vaping Use: Never used  ?Substance and Sexual Activity  ? Alcohol use: No  ? Drug use: No  ? Sexual activity: Yes  ?  Birth control/protection: Pill  ?Other Topics Concern  ? Not on file  ?Social History Narrative  ? Not on file  ? ?  Social Determinants of Health  ? ?Financial Resource Strain: Not on file  ?Food Insecurity: Not on file  ?Transportation Needs: Not on file  ?Physical Activity: Not on file  ?Stress: Not on file  ?Social Connections: Not on file  ?Intimate Partner Violence: Not on file  ? ? ?Outpatient Medications Prior to Visit  ?Medication Sig Dispense Refill  ? ALPRAZolam (XANAX) 0.25 MG tablet TAKE 1 TABLET(0.25 MG) BY MOUTH DAILY AS NEEDED FOR ANXIETY 30 tablet 2  ? amLODipine (NORVASC) 5 MG tablet TAKE 1 TABLET(5 MG) BY MOUTH DAILY 90 tablet 1  ? Chaste Tree (VITEX EXTRACT PO) Take 2 capsules by mouth daily.     ? Cholecalciferol (VITAMIN D) 2000 units CAPS Take 1 capsule by mouth daily.     ? Cyanocobalamin (B-12 SL) Place under the tongue.     ? doxylamine, Sleep, (UNISOM) 25 MG tablet Take 25 mg by mouth at  bedtime as needed.    ? Melatonin 2.5 MG CHEW Chew by mouth.    ? Multiple Vitamin (MULTIVITAMIN) capsule Take 1 capsule by mouth daily.     ? Omega-3 Fatty Acids (OMEGA 3 500) 500 MG CAPS Take 525 mg by mouth. VEGAN    ? PASSION FLOWER-VALERIAN PO Take by mouth.     ? TURMERIC PO Take 2 tablets by mouth daily.     ? Norethindrone-Ethinyl Estradiol-Fe Biphas (LO LOESTRIN FE) 1 MG-10 MCG / 10 MCG tablet Take 1 tablet by mouth daily. 84 tablet 3  ? ASHWAGANDHA PO Take 2 capsules by mouth daily. 1300 mg daily    ? IRON PO Take by mouth.     ? Selenium 200 MCG CAPS Take 1 capsule by mouth every other day.    ? ?No facility-administered medications prior to visit.  ? ? ? ? ?ROS: ? ?Review of Systems  ?Constitutional:  Negative for fever.  ?Gastrointestinal:  Negative for blood in stool, constipation, diarrhea, nausea and vomiting.  ?Genitourinary:  Negative for dyspareunia, dysuria, flank pain, frequency, hematuria, urgency, vaginal bleeding, vaginal discharge and vaginal pain.  ?Musculoskeletal:  Negative for back pain.  ?Skin:  Negative for rash.  ?Psychiatric/Behavioral:  Positive for agitation.   ?BREAST: No symptoms ? ? ?OBJECTIVE:  ? ?Vitals:  ?BP 130/90   Ht '5\' 4"'$  (1.626 m)   Wt 183 lb (83 kg)   BMI 31.41 kg/m?  ? ?Physical Exam ?Vitals reviewed.  ?Constitutional:   ?   Appearance: She is well-developed.  ?Pulmonary:  ?   Effort: Pulmonary effort is normal.  ?Musculoskeletal:     ?   General: Normal range of motion.  ?   Cervical back: Normal range of motion.  ?Skin: ?   General: Skin is warm and dry.  ?Neurological:  ?   General: No focal deficit present.  ?   Mental Status: She is alert and oriented to person, place, and time.  ?   Cranial Nerves: No cranial nerve deficit.  ?Psychiatric:     ?   Mood and Affect: Mood normal.     ?   Behavior: Behavior normal.     ?   Thought Content: Thought content normal.     ?   Judgment: Judgment normal.  ? ? ?Assessment/Plan: ?Perimenopausal symptoms - Plan:  norethindrone-ethinyl estradiol (FEMHRT LOW DOSE) 0.5-2.5 MG-MCG tablet--sx improved with Lo Loestrin but having BTB. Given hx of HTN, I can't increase dose of OCPs. Also may do better with cyclic dosing of HRT instead of OCPs. Bleeding may  actually improve with lower hormone dose. Pt hesitatant to try lower dose vs just tolerate BTB. Rx femhrt. Pt to consider changing and f/u prn sx.  ? ?Breakthrough bleeding on OCPs ? ? ?Meds ordered this encounter  ?Medications  ? norethindrone-ethinyl estradiol (FEMHRT LOW DOSE) 0.5-2.5 MG-MCG tablet  ?  Sig: Take 1 tablet by mouth daily. 3 1/2 weeks on, 4 days off  ?  Dispense:  84 tablet  ?  Refill:  0  ?  Order Specific Question:   Supervising Provider  ?  Answer:   CONSTANT, PEGGY [[id number]]  ? ? ? ? Return if symptoms worsen or fail to improve. ? ?Daesia Zylka B. Reema Chick, PA-C ?11/01/2021 ?11:48 AM ? ? ? ? ? ?

## 2021-11-01 ENCOUNTER — Encounter: Payer: Self-pay | Admitting: Obstetrics and Gynecology

## 2021-11-01 ENCOUNTER — Other Ambulatory Visit: Payer: Self-pay | Admitting: Obstetrics and Gynecology

## 2021-11-01 DIAGNOSIS — F4310 Post-traumatic stress disorder, unspecified: Secondary | ICD-10-CM | POA: Diagnosis not present

## 2021-11-01 DIAGNOSIS — N951 Menopausal and female climacteric states: Secondary | ICD-10-CM

## 2021-11-01 MED ORDER — NORETHINDRONE-ETH ESTRADIOL 0.5-2.5 MG-MCG PO TABS: 1.0000 | ORAL_TABLET | Freq: Every day | ORAL | 0 refills | Status: DC

## 2021-11-01 NOTE — Progress Notes (Signed)
Rx needed to be for 90 days supply but only getting 24 qmonth. Rx changed per pharm.  ?

## 2021-11-05 ENCOUNTER — Other Ambulatory Visit: Payer: Self-pay | Admitting: Obstetrics and Gynecology

## 2021-11-05 DIAGNOSIS — N951 Menopausal and female climacteric states: Secondary | ICD-10-CM

## 2021-11-05 MED ORDER — NORETHINDRONE-ETH ESTRADIOL 0.5-2.5 MG-MCG PO TABS: 1.0000 | ORAL_TABLET | Freq: Every day | ORAL | 0 refills | Status: DC

## 2021-11-15 DIAGNOSIS — F4310 Post-traumatic stress disorder, unspecified: Secondary | ICD-10-CM | POA: Diagnosis not present

## 2021-11-29 DIAGNOSIS — F4310 Post-traumatic stress disorder, unspecified: Secondary | ICD-10-CM | POA: Diagnosis not present

## 2021-12-13 DIAGNOSIS — F4310 Post-traumatic stress disorder, unspecified: Secondary | ICD-10-CM | POA: Diagnosis not present

## 2021-12-27 DIAGNOSIS — F4310 Post-traumatic stress disorder, unspecified: Secondary | ICD-10-CM | POA: Diagnosis not present

## 2022-01-10 DIAGNOSIS — F4310 Post-traumatic stress disorder, unspecified: Secondary | ICD-10-CM | POA: Diagnosis not present

## 2022-02-05 LAB — HEMOGLOBIN A1C: Hemoglobin A1C: 5.4

## 2022-02-05 LAB — BASIC METABOLIC PANEL
Creatinine: 0.7 (ref <=1.1)
Glucose: 95

## 2022-02-05 LAB — LIPID PANEL
Cholesterol: 148 (ref 0–200)
HDL: 51 (ref 35–70)
LDL Cholesterol: 85
LDl/HDL Ratio: 2.9
Triglycerides: 57 (ref 40–160)

## 2022-02-07 DIAGNOSIS — F4310 Post-traumatic stress disorder, unspecified: Secondary | ICD-10-CM | POA: Diagnosis not present

## 2022-02-18 ENCOUNTER — Encounter: Payer: BC Managed Care – PPO | Admitting: Family Medicine

## 2022-02-20 ENCOUNTER — Encounter: Payer: Self-pay | Admitting: Family Medicine

## 2022-02-20 ENCOUNTER — Ambulatory Visit (INDEPENDENT_AMBULATORY_CARE_PROVIDER_SITE_OTHER): Payer: BC Managed Care – PPO | Admitting: Family Medicine

## 2022-02-20 VITALS — BP 144/83 | HR 69 | Temp 98.7°F | Resp 16 | Ht 64.0 in | Wt 184.0 lb

## 2022-02-20 DIAGNOSIS — I10 Essential (primary) hypertension: Secondary | ICD-10-CM | POA: Diagnosis not present

## 2022-02-20 DIAGNOSIS — Z0000 Encounter for general adult medical examination without abnormal findings: Secondary | ICD-10-CM

## 2022-02-20 DIAGNOSIS — M5440 Lumbago with sciatica, unspecified side: Secondary | ICD-10-CM | POA: Diagnosis not present

## 2022-02-20 DIAGNOSIS — G8929 Other chronic pain: Secondary | ICD-10-CM

## 2022-02-20 DIAGNOSIS — F419 Anxiety disorder, unspecified: Secondary | ICD-10-CM

## 2022-02-20 DIAGNOSIS — E669 Obesity, unspecified: Secondary | ICD-10-CM

## 2022-02-20 DIAGNOSIS — L709 Acne, unspecified: Secondary | ICD-10-CM | POA: Diagnosis not present

## 2022-02-20 DIAGNOSIS — Z6830 Body mass index (BMI) 30.0-30.9, adult: Secondary | ICD-10-CM

## 2022-02-20 NOTE — Assessment & Plan Note (Signed)
Continues with weight loss using intermittent fasting, congratulated efforts.

## 2022-02-20 NOTE — Assessment & Plan Note (Signed)
Slightly elevated today but previously at goal and with home readings at goal, no changes today. Recommend monitoring at home, if remains elevated, return for recheck in 1-2 months, otherwise f/u 6 months. Obtaining labs today.

## 2022-02-20 NOTE — Assessment & Plan Note (Signed)
Chronic, stable despite recent worsened family stressors. Good coping mechanisms, has therapist, and with appropriate xanax use. PDMP reviewed. Continue current regimen. Continues to not be interested in daily maintenance therapy.
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
## 2022-02-21 DIAGNOSIS — F4310 Post-traumatic stress disorder, unspecified: Secondary | ICD-10-CM | POA: Diagnosis not present

## 2022-02-21 LAB — COMPREHENSIVE METABOLIC PANEL
ALT: 22 IU/L (ref 0–32)
AST: 21 IU/L (ref 0–40)
Albumin/Globulin Ratio: 1.7 (ref 1.2–2.2)
Albumin: 4.6 g/dL (ref 3.8–4.9)
Alkaline Phosphatase: 86 IU/L (ref 44–121)
BUN/Creatinine Ratio: 13 (ref 9–23)
BUN: 10 mg/dL (ref 6–24)
Bilirubin Total: 0.3 mg/dL (ref 0.0–1.2)
CO2: 22 mmol/L (ref 20–29)
Calcium: 9.4 mg/dL (ref 8.7–10.2)
Chloride: 102 mmol/L (ref 96–106)
Creatinine, Ser: 0.76 mg/dL (ref 0.57–1.00)
Globulin, Total: 2.7 g/dL (ref 1.5–4.5)
Glucose: 99 mg/dL (ref 70–99)
Potassium: 4.3 mmol/L (ref 3.5–5.2)
Sodium: 137 mmol/L (ref 134–144)
Total Protein: 7.3 g/dL (ref 6.0–8.5)
eGFR: 94 mL/min/{1.73_m2} (ref >59)

## 2022-02-21 LAB — CBC WITH DIFFERENTIAL/PLATELET
Basophils Absolute: 0.1 10*3/uL (ref 0.0–0.2)
Basos: 2 %
EOS (ABSOLUTE): 0.1 10*3/uL (ref 0.0–0.4)
Eos: 2 %
Hematocrit: 40 % (ref 34.0–46.6)
Hemoglobin: 13.6 g/dL (ref 11.1–15.9)
Immature Grans (Abs): 0 10*3/uL (ref 0.0–0.1)
Immature Granulocytes: 0 %
Lymphocytes Absolute: 2 10*3/uL (ref 0.7–3.1)
Lymphs: 26 %
MCH: 31.2 pg (ref 26.6–33.0)
MCHC: 34 g/dL (ref 31.5–35.7)
MCV: 92 fL (ref 79–97)
Monocytes Absolute: 0.9 10*3/uL (ref 0.1–0.9)
Monocytes: 11 %
Neutrophils Absolute: 4.5 10*3/uL (ref 1.4–7.0)
Neutrophils: 59 %
Platelets: 315 10*3/uL (ref 150–450)
RBC: 4.36 x10E6/uL (ref 3.77–5.28)
RDW: 12.5 % (ref 11.7–15.4)
WBC: 7.6 10*3/uL (ref 3.4–10.8)

## 2022-02-25 ENCOUNTER — Other Ambulatory Visit: Payer: Self-pay | Admitting: Obstetrics and Gynecology

## 2022-02-25 DIAGNOSIS — N951 Menopausal and female climacteric states: Secondary | ICD-10-CM

## 2022-03-01 IMAGING — DX DG CHEST 2V
2 series · 2 of 2 positions shown · non-contrast
Comparison: 02/13/2017

CLINICAL DATA: Cough.  Recent COVID

EXAM:
CHEST - 2 VIEW

[chest pa]
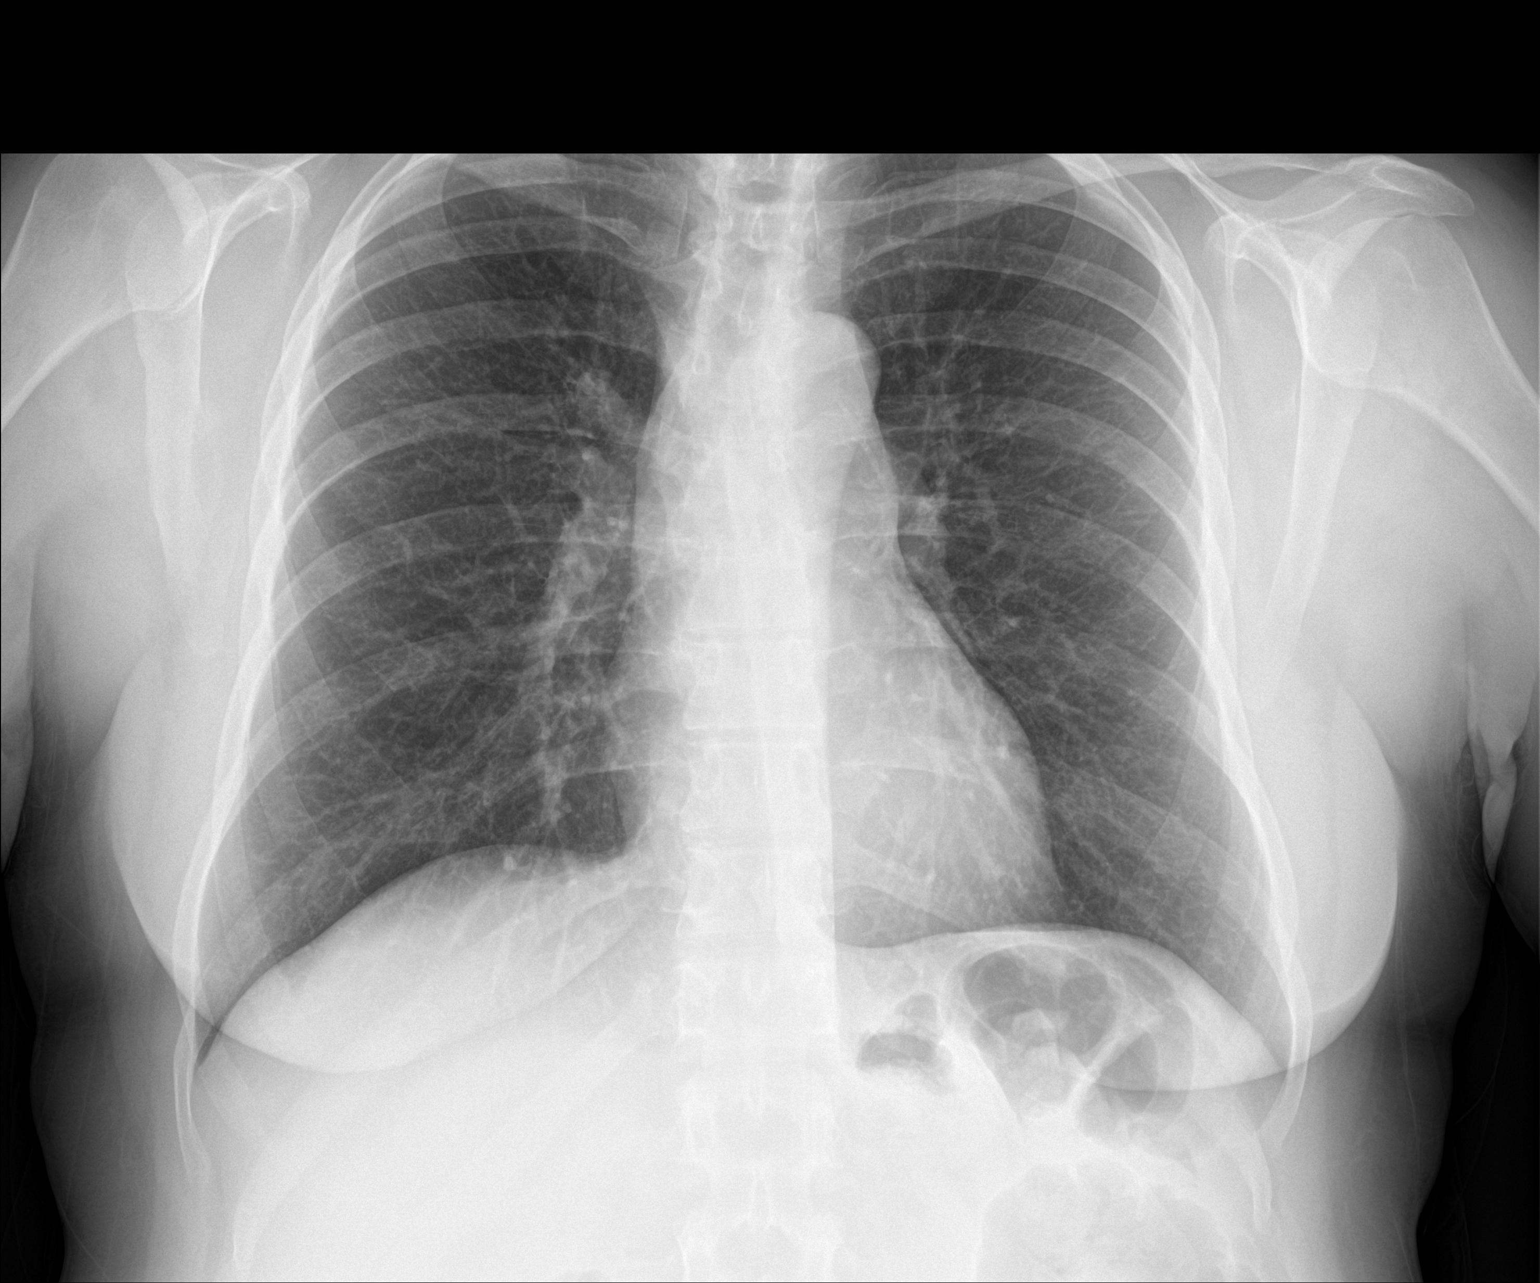

[chest lat]
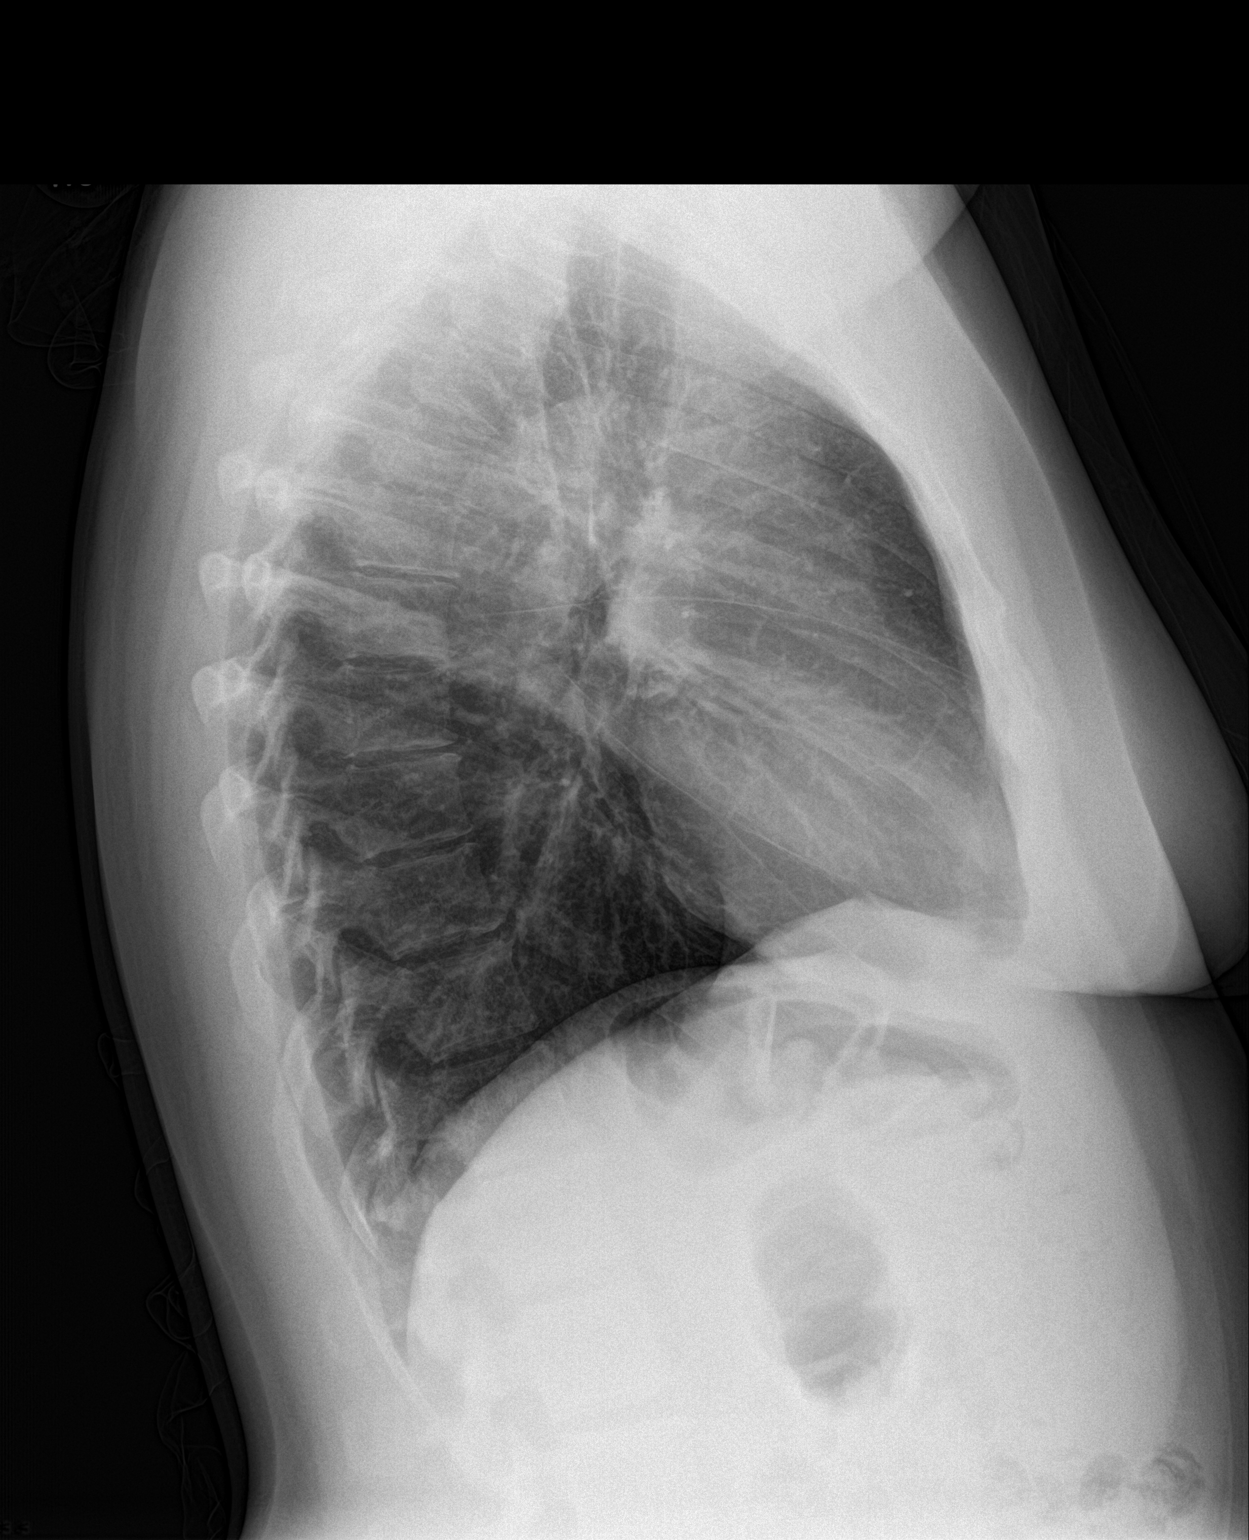

[2 of 2 positions shown; findings below may reference images not displayed]

FINDINGS: The heart size and mediastinal contours are within normal limits.
Both lungs are clear. The visualized skeletal structures are
unremarkable.
IMPRESSION: No active cardiopulmonary disease.

## 2022-03-07 DIAGNOSIS — F4310 Post-traumatic stress disorder, unspecified: Secondary | ICD-10-CM | POA: Diagnosis not present

## 2022-03-21 DIAGNOSIS — F4310 Post-traumatic stress disorder, unspecified: Secondary | ICD-10-CM | POA: Diagnosis not present

## 2022-03-22 ENCOUNTER — Other Ambulatory Visit: Payer: Self-pay | Admitting: Family Medicine

## 2022-03-24 ENCOUNTER — Other Ambulatory Visit: Payer: Self-pay | Admitting: Family Medicine

## 2022-04-04 DIAGNOSIS — F4310 Post-traumatic stress disorder, unspecified: Secondary | ICD-10-CM | POA: Diagnosis not present

## 2022-04-15 ENCOUNTER — Ambulatory Visit: Payer: BC Managed Care – PPO | Attending: Family Medicine | Admitting: Physical Therapy

## 2022-04-15 DIAGNOSIS — G8929 Other chronic pain: Secondary | ICD-10-CM | POA: Insufficient documentation

## 2022-04-15 DIAGNOSIS — M25551 Pain in right hip: Secondary | ICD-10-CM | POA: Diagnosis not present

## 2022-04-15 DIAGNOSIS — M5440 Lumbago with sciatica, unspecified side: Secondary | ICD-10-CM | POA: Insufficient documentation

## 2022-04-15 NOTE — Therapy (Signed)
OUTPATIENT PHYSICAL THERAPY THORACOLUMBAR EVALUATION   Patient Name: Dana Livingston MRN: [id number] DOB:09/15/1970, 51 y.o., female Today's Date: 04/17/2022    Past Medical History:  Diagnosis Date   Anxiety    Complication of anesthesia 1989   for wisdom teeth extracted in office, could not get her under enough so had to do at hospital   Fibroids    Hypertension    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 2001   normal pap smears since 2002   Panic attacks    Past Surgical History:  Procedure Laterality Date   CHOLECYSTECTOMY  1991   LAPAROSCOPIC OVARIAN CYSTECTOMY Left 1998   LASIK  2001   ROBOTIC ASSISTED LAPAROSCOPIC OVARIAN CYSTECTOMY Left 02/14/2015   Procedure: ROBOTIC ASSISTED DRAINAGE LEFT OVARIAN CYST; FULAGRATION ON PERITONEAL ENDOMETRIOSIS;  Surgeon: [name], MD;  Location: [facility];  Service: Gynecology;  Laterality: Left;   ROBOTIC ASSISTED SALPINGO OOPHERECTOMY Right 02/14/2015   Procedure: ROBOTIC ASSISTED RIGHT SALPINGO OOPHORECTOMY;  Surgeon: [name], MD;  Location: [facility];  Service: Gynecology;  Laterality: Right;   TONSILLECTOMY  1981   WISDOM TOOTH EXTRACTION     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   Elevated cancer antigen 125 (CA-125) 01/18/2015   Fibroids 01/18/2015    PCP: Lavon Paganini MD  REFERRING PROVIDER: Rory Percy DO  REFERRING DIAG: LBP  Rationale for Evaluation and Treatment Rehabilitation  THERAPY DIAG:  Pain in right hip - Plan: PT plan of care cert/re-cert  ONSET DATE: Jan 2023  SUBJECTIVE:                                                                                                                                                                                            SUBJECTIVE STATEMENT:  PERTINENT HISTORY:  Pt is a 51 year old female presenting with L sided hip pain, referral for LBP with sciatica. Pt reports pain began NYE with insidious onset, and has gotten much better. Pt reports pain is more of a cramping location on the lateral hip when she pulls her leg toward her. Reports she has previously had sacral pain but that has improved. After initial injury she has been completing exercises such as deadbug and cat/cow, and changing her mattress which has greatly help. Denies n/t down post RLE, reports she does  have some medial thigh numbness when she lays on her L side. Pt describes pain as "nerve injury" repeatedly, using word descriptors of MSK nature (cramp, ache, tension). Current and best pain 1/10; worst 5/10. Pain is exacerbated by leaning on R hip, crossing leg to don/doff shoes/ paint toe nails. Pt works full time as a Programmer, systems, with a good ergonomic set up, reports no pain/cramping with this with changing position to sit. Pt enjoys hiking, and gardening.  Pt denies N/V, B&B changes, unexplained weight fluctuation, saddle paresthesia, fever, night sweats, or unrelenting night pain at this time.   PAIN:  Are you having pain? Yes: NPRS scale: 1/10 Pain location: lateral hip Pain description: cramp, tension  Aggravating factors: leaning on RLE, pulling R hip up to don/doff shoes (hip flex) Relieving factors: straightening my leg back out   PRECAUTIONS: None  WEIGHT BEARING RESTRICTIONS No  FALLS:  Has patient fallen in last 6 months? 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  PLOF: Independent  PATIENT GOALS To improve my ROM to be able to paint my toe nails   OBJECTIVE:   DIAGNOSTIC  FINDINGS:  Xray previously patient reports MD reported "not able to discern if my nerve injury is due to disc or muscle strain"  PATIENT SURVEYS:  FOTO 55 goal of 71  SCREENING FOR RED FLAGS: Bowel or bladder incontinence: No Spinal tumors: No Cauda equina syndrome: No Compression fracture: No Abdominal aneurysm: No  COGNITION:  Overall cognitive status: Within functional limits for tasks assessed     SENSATION: WFL  MUSCLE LENGTH: Hamstrings: Right  WNLdeg; Left WNL deg Thomas test: Right WNL deg; Left WNL with pain   POSTURE: decreased lumbar lordosis and weight shift left  PALPATION: TTP with concordant pain sign to greater trochanter TTP along ITB to distal insertion Tension, trigger points, and pain to superior glute max, glute med and TFL Minimal tenderness to deep palpation of hip flexor/adductor groups  LUMBAR ROM:   Active  A/PROM  eval  Flexion WNL  Extension WNL  Right lateral flexion WNL  Left lateral flexion WNL  Right rotation WNL  Left rotation WNL   (Blank rows = not tested)  LOWER EXTREMITY ROM:     All BLE motions WNL with concordant pain to FADIR position  LOWER EXTREMITY MMT:    MMT Right eval Left eval  Hip flexion 4+ 5  Hip extension 4 5  Hip abduction 4+ 5  Hip adduction    Hip internal rotation 5 5  Hip external rotation 5 5  Knee flexion 5 5  Knee extension 5 5  Ankle dorsiflexion    Ankle plantarflexion    Ankle inversion    Ankle eversion     1RM leg press R: 70# L 85# SL STS R:11  L: 11  (Blank rows = not tested)  LUMBAR SPECIAL TESTS:  Straight leg raise test: Negative, Slump test: Negative, and FABER test: Negative  FUNCTIONAL TESTS:  5 times sit to stand: 9sec 10 meter walk test: 1.67m GAIT: Distance walked: 264mAssistive device utilized: None Level of assistance: Complete Independence Comments: slight decrease in R stance time; slight R trunk rotation and lateral flex maintained throughout  gait    TODAY'S TREATMENT  PT reviewed the following HEP with patient with patient able to demonstrate a set of  the following with min cuing for correction needed. PT educated patient on parameters of therex (how/when to inc/decrease intensity, frequency, rep/set range, stretch hold time, and purpose of therex) with verbalized understanding.  Piriformis stretch 30sec 2-3x/day Lumbar ext x12 every hour   PATIENT EDUCATION:  Education details: Patient was educated on diagnosis, anatomy and pathology involved, prognosis, role of PT, and was given an HEP, demonstrating exercise with proper form following verbal and tactile cues, and was given a paper hand out to continue exercise at home. Pt was educated on and agreed to plan of care. Person educated: Patient Education method: Explanation, Demonstration, Tactile cues, Verbal cues, and Handouts Education comprehension: verbalized understanding and returned demonstration   HOME EXERCISE PROGRAM: Piriformis stretch and lumbar ext repeated  ASSESSMENT:  CLINICAL IMPRESSION: Patient is a 51 y.o. female presenting with signs and symptoms of R ITB syndrome, possible bursitis. Impairments in decreased R hip strength, painful end range mobility, decreased activity tolerance, abnormal gait, and pain. Activity limitations in forward bending, donning/doffing shoes, laying on her side, prolonged ambulation; inhibiting full participation in community ADLs. Would benefit from skilled PT to address above deficits and promote optimal return to PLOF.    OBJECTIVE IMPAIRMENTS Abnormal gait, decreased activity tolerance, decreased balance, decreased cognition, decreased coordination, decreased endurance, decreased mobility, difficulty walking, decreased strength, increased fascial restrictions, impaired perceived functional ability, increased muscle spasms, impaired flexibility, impaired UE functional use, improper body mechanics, postural dysfunction, and pain.    ACTIVITY LIMITATIONS lifting, bending, sitting, squatting, sleeping, and dressing  PARTICIPATION LIMITATIONS: cleaning, laundry, shopping, and community activity  PERSONAL FACTORS Fitness, Past/current experiences, Profession, Time since onset of injury/illness/exacerbation, and 3+ comorbidities: anxiety, HTN, obesity  are also affecting patient's functional outcome.   REHAB POTENTIAL: Good  CLINICAL DECISION MAKING: Evolving/moderate complexity  EVALUATION COMPLEXITY: Moderate   GOALS: Goals reviewed with patient? Yes  SHORT TERM GOALS: Target date: 05/15/2022  Pt will be independent with HEP in order to improve strength and balance in order to decrease fall risk and improve function at home and work.  Baseline: HEP given  Goal status: INITIAL   LONG TERM GOALS: Target date: 06/12/2022  Patient will increase FOTO score to 83 to demonstrate predicted increase in functional mobility to complete ADLs Baseline: 55 Goal status: INITIAL  2.  Pt will decrease worst pain as reported on NPRS by at least 3 points in order to demonstrate clinically significant reduction in pain.  Baseline: 5/10 Goal status: INITIAL  3.  Pt will demonstrate RLE leg press of 76.5# or more in order to demonstrate 90% strength of unaffected LE needed for heavy ADLs Baseline: 70# Goal status: INITIAL  4.  Pt will demonstrate gross R hip MMT of 5/5 to demonstrate PLOF needed for heavy ADLs Baseline: see eval Goal status: INITIAL     PLAN: PT FREQUENCY: 1-2x/week  PT DURATION: 8 weeks  PLANNED INTERVENTIONS: Therapeutic exercises, Therapeutic activity, Neuromuscular re-education, Balance training, Gait training, Patient/Family education, Self Care, Joint mobilization, Joint manipulation, Stair training, Dry Needling, Electrical stimulation, Spinal manipulation, Spinal mobilization, Cryotherapy, Moist heat, Traction, Ultrasound, Manual therapy, and Re-evaluation.  PLAN FOR NEXT SESSION: possible  TPDN  Durwin Reges DPT  Durwin Reges, PT 04/17/2022, 10:33 AM

## 2022-04-17 ENCOUNTER — Encounter: Payer: Self-pay | Admitting: Physical Therapy

## 2022-04-17 ENCOUNTER — Ambulatory Visit: Payer: BC Managed Care – PPO | Admitting: Physical Therapy

## 2022-04-17 DIAGNOSIS — M25551 Pain in right hip: Secondary | ICD-10-CM

## 2022-04-17 DIAGNOSIS — M5440 Lumbago with sciatica, unspecified side: Secondary | ICD-10-CM | POA: Diagnosis not present

## 2022-04-17 DIAGNOSIS — G8929 Other chronic pain: Secondary | ICD-10-CM | POA: Diagnosis not present

## 2022-04-17 NOTE — Therapy (Addendum)
OUTPATIENT PHYSICAL THERAPY THORACOLUMBAR TREATMENT   Patient Name: Dana Livingston MRN: [id number] DOB:21-Dec-1970, 51 y.o., female Today's Date: 04/29/2022     Past Medical History:  Diagnosis Date   Anxiety    Complication of anesthesia 1989   for wisdom teeth extracted in office, could not get her under enough so had to do at hospital   Fibroids    Hypertension    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 2001   normal pap smears since 2002   Panic attacks    Past Surgical History:  Procedure Laterality Date   CHOLECYSTECTOMY  1991   LAPAROSCOPIC OVARIAN CYSTECTOMY Left 1998   LASIK  2001   ROBOTIC ASSISTED LAPAROSCOPIC OVARIAN CYSTECTOMY Left 02/14/2015   Procedure: ROBOTIC ASSISTED DRAINAGE LEFT OVARIAN CYST; FULAGRATION ON PERITONEAL ENDOMETRIOSIS;  Surgeon: [name], MD;  Location: [facility];  Service: Gynecology;  Laterality: Left;   ROBOTIC ASSISTED SALPINGO OOPHERECTOMY Right 02/14/2015   Procedure: ROBOTIC ASSISTED RIGHT SALPINGO OOPHORECTOMY;  Surgeon: [name], MD;  Location: [facility];  Service: Gynecology;  Laterality: Right;   TONSILLECTOMY  1981   WISDOM TOOTH EXTRACTION     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 Reports the stretches are definitely "hitting the right spot". 0/10 pain currently  PERTINENT HISTORY:  Pt is a 51 year old female presenting with L sided hip pain, referral for LBP with sciatica. Pt reports pain began NYE with insidious onset, and has gotten much better. Pt reports pain is more of a cramping location on the lateral hip when she pulls her leg toward her. Reports she has previously had sacral pain but that has improved. After initial injury she has been completing exercises such as deadbug and cat/cow,  and changing her mattress which has greatly help. Denies n/t down post RLE, reports she does have some medial thigh numbness when she lays on her L side. Pt describes pain as "nerve injury" repeatedly, using word descriptors of MSK nature (cramp, ache, tension). Current and best pain 1/10; worst 5/10. Pain is exacerbated by leaning on R hip, crossing leg to don/doff shoes/ paint toe nails. Pt works full time as a Programmer, systems, with a good ergonomic set up, reports no pain/cramping with this with changing position to sit. Pt enjoys hiking, and gardening.  Pt denies N/V, B&B changes, unexplained weight fluctuation, saddle paresthesia, fever, night sweats, or unrelenting night pain at this time.   PAIN:  Are you having pain? Yes: NPRS scale: 1/10 Pain location: lateral hip Pain description: cramp, tension  Aggravating factors: leaning on RLE, pulling R hip up to don/doff shoes (hip flex) Relieving factors: straightening my leg back out   PRECAUTIONS: None  WEIGHT BEARING RESTRICTIONS No  FALLS:  Has patient fallen in last 6 months? 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  PLOF:  Independent  PATIENT GOALS To improve my ROM to be able to paint my toe nails   OBJECTIVE:   DIAGNOSTIC FINDINGS:  Xray previously patient reports MD reported "not able to discern if my nerve injury is due to disc or muscle strain"  PATIENT SURVEYS:  FOTO 55 goal of 48  SCREENING FOR RED FLAGS: Bowel or bladder incontinence: No Spinal tumors: No Cauda equina syndrome: No Compression fracture: No Abdominal aneurysm: No  COGNITION:  Overall cognitive status: Within functional limits for tasks assessed     SENSATION: WFL  MUSCLE LENGTH: Hamstrings: Right  WNLdeg; Left WNL deg Thomas test: Right WNL deg; Left WNL with pain   POSTURE: decreased lumbar lordosis and weight shift left  PALPATION: TTP with concordant pain sign to greater trochanter TTP along ITB to distal insertion Tension, trigger points, and pain to superior glute max, glute med and TFL Minimal tenderness to deep palpation of hip flexor/adductor groups  LUMBAR ROM:   Active  A/PROM  eval  Flexion WNL  Extension WNL  Right lateral flexion WNL  Left lateral flexion WNL  Right rotation WNL  Left rotation WNL   (Blank rows = not tested)  LOWER EXTREMITY ROM:     All BLE motions WNL with concordant pain to FADIR position  LOWER EXTREMITY MMT:    MMT Right eval Left eval  Hip flexion 4+ 5  Hip extension 4 5  Hip abduction 4+ 5  Hip adduction    Hip internal rotation 5 5  Hip external rotation 5 5  Knee flexion 5 5  Knee extension 5 5  Ankle dorsiflexion    Ankle plantarflexion    Ankle inversion    Ankle eversion     1RM leg press R: 70# L 85# SL STS R:11  L: 11  (Blank rows = not tested)  LUMBAR SPECIAL TESTS:  Straight leg raise test: Negative, Slump test: Negative, and FABER test: Negative  FUNCTIONAL TESTS:  5 times sit to stand: 9sec 10 meter walk test: 1.4m GAIT: Distance walked: 238mAssistive device utilized: None Level of assistance: Complete Independence Comments:  slight decrease in R stance time; slight R trunk rotation and lateral flex maintained throughout gait    TODAY'S TREATMENT  Nustep seat 7 UE 9 L3 75mn for gentle rotation and strengthening Bridge with RTB at knees x10; on bosu ball (hardside) 2x 1- with cuing for initial set up with good carry over Bird dog 2x 12 with VC and TC of cone on low back for carry over of core activation with decent carry over Squat to mat table with RTB prox to knees 2x 10 with excellent carry over of demo  Hip hinge x12 with excellent carry over of demo; with 10# DB 2x 10 with good carry with min VC needed Piriformis stretch 30sec 2-3x/day Hip flexor stretch half kneeling 30sec  PT reviewed the following HEP with patient with patient able to demonstrate a set of the following with min cuing for correction needed. PT educated patient on parameters of therex (how/when to inc/decrease intensity, frequency, rep/set range, stretch hold time, and purpose of therex) with verbalized understanding.  Piriformis stretch 30sec 2-3x/day Hip flexor stretch half kneeling 30sec 2-3x/day   PATIENT EDUCATION:  Education details: Patient was educated on diagnosis, anatomy and pathology involved, prognosis, role of PT, and was given an HEP, demonstrating exercise with proper form following verbal and tactile cues, and was given a paper hand out to continue exercise at home. Pt was educated on and agreed to plan of care. Person educated: Patient Education method: Explanation, Demonstration, Tactile cues, Verbal cues, and Handouts Education comprehension: verbalized understanding and returned demonstration   HOME EXERCISE PROGRAM: Piriformis stretch and half kneeling hip flexor stretch   ASSESSMENT:  CLINICAL IMPRESSION: PT initiated therex progression for increased hip and core stability in functional lifting with success. Pt is able to comply with all cuing for proper technique of therex with good effort throughout session. No  increased pain. PT will continue progression as able.     OBJECTIVE IMPAIRMENTS Abnormal gait, decreased activity tolerance, decreased balance, decreased cognition, decreased coordination, decreased endurance, decreased mobility, difficulty walking, decreased strength, increased fascial restrictions, impaired perceived functional ability, increased muscle spasms, impaired flexibility, impaired UE functional use, improper body mechanics, postural dysfunction, and pain.   ACTIVITY LIMITATIONS lifting, bending, sitting, squatting, sleeping, and dressing  PARTICIPATION LIMITATIONS: cleaning, laundry, shopping, and community activity  PERSONAL FACTORS Fitness, Past/current experiences, Profession, Time since onset of injury/illness/exacerbation, and 3+ comorbidities: anxiety, HTN, obesity  are also affecting patient's functional outcome.   REHAB POTENTIAL: Good  CLINICAL DECISION MAKING: Evolving/moderate complexity  EVALUATION COMPLEXITY: Moderate   GOALS: Goals reviewed with patient? Yes  SHORT TERM GOALS: Target date: 05/27/2022  Pt will be independent with HEP in order to improve strength and balance in order to decrease fall risk and improve function at home and work.  Baseline: HEP given  Goal status: INITIAL   LONG TERM GOALS: Target date: 06/24/2022  Patient will increase FOTO score to 83 to demonstrate predicted increase in functional mobility to complete ADLs Baseline: 55 Goal status: INITIAL  2.  Pt will decrease worst pain as reported on NPRS by at least 3 points in order to demonstrate clinically significant reduction in pain.  Baseline: 5/10 Goal status: INITIAL  3.  Pt will demonstrate RLE leg press of 76.5# or more in order to demonstrate 90% strength of unaffected LE needed for heavy ADLs Baseline: 70# Goal status: INITIAL  4.  Pt will demonstrate gross R hip MMT of 5/5 to demonstrate PLOF needed for heavy ADLs Baseline: see eval Goal status:  INITIAL     PLAN: PT FREQUENCY: 1-2x/week  PT DURATION: 8 weeks  PLANNED INTERVENTIONS: Therapeutic  exercises, Therapeutic activity, Neuromuscular re-education, Balance training, Gait training, Patient/Family education, Self Care, Joint mobilization, Joint manipulation, Stair training, Dry Needling, Electrical stimulation, Spinal manipulation, Spinal mobilization, Cryotherapy, Moist heat, Traction, Ultrasound, Manual therapy, and Re-evaluation.  PLAN FOR NEXT SESSION: possible TPDN  Durwin Reges DPT  Durwin Reges, PT 04/29/2022, 1:45 PM

## 2022-04-18 DIAGNOSIS — F4310 Post-traumatic stress disorder, unspecified: Secondary | ICD-10-CM | POA: Diagnosis not present

## 2022-04-21 ENCOUNTER — Emergency Department
Admission: EM | Admit: 2022-04-21 | Discharge: 2022-04-21 | Disposition: A | Discharge status: Home / Self Care | Payer: BC Managed Care – PPO | Attending: Emergency Medicine | Admitting: Emergency Medicine

## 2022-04-21 ENCOUNTER — Encounter: Payer: Self-pay | Admitting: Intensive Care

## 2022-04-21 ENCOUNTER — Other Ambulatory Visit: Payer: Self-pay

## 2022-04-21 DIAGNOSIS — H6981 Other specified disorders of Eustachian tube, right ear: Secondary | ICD-10-CM | POA: Diagnosis not present

## 2022-04-21 DIAGNOSIS — H6991 Unspecified Eustachian tube disorder, right ear: Secondary | ICD-10-CM | POA: Diagnosis not present

## 2022-04-21 DIAGNOSIS — R42 Dizziness and giddiness: Secondary | ICD-10-CM | POA: Insufficient documentation

## 2022-04-21 LAB — CBC WITH DIFFERENTIAL/PLATELET
Abs Immature Granulocytes: 0.05 10*3/uL (ref 0.00–0.07)
Basophils Absolute: 0.1 10*3/uL (ref 0.0–0.1)
Basophils Relative: 1 %
Eosinophils Absolute: 0 10*3/uL (ref 0.0–0.5)
Eosinophils Relative: 0 %
HCT: 41.5 % (ref 36.0–46.0)
Hemoglobin: 13.8 g/dL (ref 12.0–15.0)
Immature Granulocytes: 1 %
Lymphocytes Relative: 10 %
Lymphs Abs: 1 10*3/uL (ref 0.7–4.0)
MCH: 30.6 pg (ref 26.0–34.0)
MCHC: 33.3 g/dL (ref 30.0–36.0)
MCV: 92 fL (ref 80.0–100.0)
Monocytes Absolute: 0.5 10*3/uL (ref 0.1–1.0)
Monocytes Relative: 4 %
Neutro Abs: 8.9 10*3/uL — ABNORMAL HIGH (ref 1.7–7.7)
Neutrophils Relative %: 84 %
Platelets: 334 10*3/uL (ref 150–400)
RBC: 4.51 MIL/uL (ref 3.87–5.11)
RDW: 12.8 % (ref 11.5–15.5)
WBC: 10.5 10*3/uL (ref 4.0–10.5)
nRBC: 0 % (ref 0.0–0.2)

## 2022-04-21 LAB — URINALYSIS, ROUTINE W REFLEX MICROSCOPIC
Bacteria, UA: NONE SEEN
Bilirubin Urine: NEGATIVE
Glucose, UA: NEGATIVE mg/dL
Ketones, ur: NEGATIVE mg/dL
Leukocytes,Ua: NEGATIVE
Nitrite: NEGATIVE
Protein, ur: NEGATIVE mg/dL
Specific Gravity, Urine: 1.016 (ref 1.005–1.030)
pH: 6 (ref 5.0–8.0)

## 2022-04-21 LAB — BASIC METABOLIC PANEL
Anion gap: 10 (ref 5–15)
BUN: 10 mg/dL (ref 6–20)
CO2: 23 mmol/L (ref 22–32)
Calcium: 9.1 mg/dL (ref 8.9–10.3)
Chloride: 106 mmol/L (ref 98–111)
Creatinine, Ser: 0.67 mg/dL (ref 0.44–1.00)
GFR, Estimated: >60 mL/min (ref >60)
Glucose, Bld: 160 mg/dL — ABNORMAL HIGH (ref 70–99)
Potassium: 4.4 mmol/L (ref 3.5–5.1)
Sodium: 139 mmol/L (ref 135–145)

## 2022-04-21 LAB — TROPONIN I (HIGH SENSITIVITY)
Troponin I (High Sensitivity): 3 ng/L (ref <18)
Troponin I (High Sensitivity): <2 ng/L (ref <18)

## 2022-04-21 MED ORDER — CETIRIZINE HCL 10 MG PO TABS: 10.0000 mg | ORAL_TABLET | Freq: Every day | ORAL | 0 refills | Status: AC

## 2022-04-21 MED ORDER — FLUTICASONE PROPIONATE 50 MCG/ACT NA SUSP: 1.0000 | Freq: Two times a day (BID) | NASAL | 0 refills | Status: DC

## 2022-04-21 NOTE — ED Provider Triage Note (Signed)
Emergency Medicine Provider Triage Evaluation Note  Dallana Mavity , a 51 y.o. female  was evaluated in triage.  Pt complains of vertigo. Reports that she rolled over in bed to get something off the nightstand and reports that everything is moving. She has had nausea without vomiting. Spinning gets better when she sits still. Worse with turning to the right. No headache. No weakness/paresthesias/dysarthria.  Review of Systems  Positive: dizzy Negative: headache  Physical Exam  BP (!) 166/86 (BP Location: Left Arm)   Pulse 70   Temp 98 F (36.7 C) (Oral)   Resp 18   SpO2 96%  Gen:   Awake, no distress   Resp:  Normal effort  MSK:   Moves extremities without difficulty  Other:    Medical Decision Making  Medically screening exam initiated at 11:15 AM.  Appropriate orders placed.  Shantil Vallejo was informed that the remainder of the evaluation will be completed by another provider, this initial triage assessment does not replace that evaluation, and the importance of remaining in the ED until their evaluation is complete.     Marquette Old, PA-C 04/21/22 1118

## 2022-04-21 NOTE — ED Provider Notes (Signed)
[facility] Provider Note  Patient Contact: 8:55 PM (approximate)   History   Dizziness   HPI  Dana Livingston is a 51 y.o. female who presents the emergency department complaining of vertigo-like symptoms.  Patient states that she does have some mild intermittent vertigo and has a prescription for meclizine.  This is much more severe, states that if she tips her head a certain way she will have the symptoms.  As long as she is sitting still she has no vertigo-like symptoms.  No headache, vision changes, unilateral weakness, slurred speech.  Patient does have a history of mild intermittent vertigo but this is worse than normal.  Has not tried medications prior to arrival.     Physical Exam   Triage Vital Signs: ED Triage Vitals  Enc Vitals Group     BP 04/21/22 1110 (!) 166/86     Pulse Rate 04/21/22 1110 70     Resp 04/21/22 1110 18     Temp 04/21/22 1110 98 F (36.7 C)     Temp Source 04/21/22 1110 Oral     SpO2 04/21/22 1110 96 %     Weight 04/21/22 1116 184 lb (83.5 kg)     Height 04/21/22 1116 '5\' 4"'$  (1.626 m)     Head Circumference --      Peak Flow --      Pain Score 04/21/22 1116 0     Pain Loc --      Pain Edu? --      Excl. in [place]? --     Most recent vital signs: Vitals:   04/21/22 1956 04/21/22 2117  BP:  (!) 181/89  Pulse:  60  Resp:  17  Temp: 98.3 F (36.8 C) 99 F (37.2 C)  SpO2:  95%     General: Alert and in no acute distress. Eyes:  PERRL. EOMI. Head: No acute traumatic findings ENT:      Ears:       Nose: No congestion/rhinnorhea.      Mouth/Throat: Mucous membranes are moist. Neck: No stridor. No cervical spine tenderness to palpation.  Cardiovascular:  Good peripheral perfusion Respiratory: Normal respiratory effort without tachypnea or retractions. Lungs CTAB. Musculoskeletal: Full range of motion to all extremities.  Neurologic:  No gross focal neurologic deficits are appreciated.  Skin:   No rash  noted Other:   ED Results / Procedures / Treatments   Labs (all labs ordered are listed, but only abnormal results are displayed) Labs Reviewed  BASIC METABOLIC PANEL - Abnormal; Notable for the following components:      Result Value   Glucose, Bld 160 (*)    All other components within normal limits  CBC WITH DIFFERENTIAL/PLATELET - Abnormal; Notable for the following components:   Neutro Abs 8.9 (*)    All other components within normal limits  URINALYSIS, ROUTINE W REFLEX MICROSCOPIC - Abnormal; Notable for the following components:   Color, Urine YELLOW (*)    APPearance CLEAR (*)    Hgb urine dipstick SMALL (*)    All other components within normal limits  TROPONIN I (HIGH SENSITIVITY)  TROPONIN I (HIGH SENSITIVITY)     EKG  ED ECG REPORT I, Charline Bills Kellis Topete,  personally viewed and interpreted this ECG.   Date: 04/21/2022  EKG Time: 1122 hrs.  Rate: 68 bpm  Rhythm: normal sinus rhythm, 1st degree AV block, compared to previous EKG from 02/14/2017, no significant changes other than the fact that patient's PR  interval has lengthened into first-degree AV block.  Axis: Normal axis  Intervals:first-degree A-V block   ST&T Change: No ST elevation or depression noted  Normal sinus rhythm.  No STEMI.  First-degree AV block.  PR interval 216 ms    RADIOLOGY   No results found.  PROCEDURES:  Critical Care performed: No  Procedures   MEDICATIONS ORDERED IN ED: Medications - No data to display   IMPRESSION / MDM / [place] / ED COURSE  I reviewed the triage vital signs and the nursing notes.                              Differential diagnosis includes, but is not limited to, vertigo, sinusitis, eustachian tube dysfunction, CVA  Patient's presentation is most consistent with acute presentation with potential threat to life or bodily function.   Patient's diagnosis is consistent with vertigo, eustachian tube dysfunction.  Patient presents to  the ED with complaint of dizziness when tilting her head down to the right.  Patient has findings consistent with vertigo.  Neurologically intact on exam.  Patient's labs are reassuring.  Patient has bulging in the right TM, none on the left.  Suspect a component of eustachian tube dysfunction aggravating her already underlying vertigo.  At this time continue meclizine, will also add second-generation antihistamine and Flonase for symptom improvement.  Concerning signs and symptoms to return to the ED are discussed with the patient.  Otherwise follow-up primary care as needed.. Patient is given ED precautions to return to the ED for any worsening or new symptoms.        FINAL CLINICAL IMPRESSION(S) / ED DIAGNOSES   Final diagnoses:  None     Rx / DC Orders   ED Discharge Orders     None        Note:  This document was prepared using Dragon voice recognition software and may include unintentional dictation errors.   Brynda Peon 04/21/22 2122    Nance Pear, MD 04/21/22 2156

## 2022-04-21 NOTE — ED Triage Notes (Signed)
Patient reports after waking up the room started spinning/moving. Reports some nausea with episode

## 2022-04-22 ENCOUNTER — Ambulatory Visit: Payer: BC Managed Care – PPO | Admitting: Physical Therapy

## 2022-04-24 ENCOUNTER — Ambulatory Visit: Payer: BC Managed Care – PPO | Admitting: Physical Therapy

## 2022-04-29 ENCOUNTER — Encounter: Payer: Self-pay | Admitting: Physical Therapy

## 2022-04-29 ENCOUNTER — Ambulatory Visit: Payer: BC Managed Care – PPO | Admitting: Physical Therapy

## 2022-04-29 DIAGNOSIS — F4310 Post-traumatic stress disorder, unspecified: Secondary | ICD-10-CM | POA: Diagnosis not present

## 2022-04-29 DIAGNOSIS — M25551 Pain in right hip: Secondary | ICD-10-CM

## 2022-04-29 DIAGNOSIS — M5440 Lumbago with sciatica, unspecified side: Secondary | ICD-10-CM | POA: Diagnosis not present

## 2022-04-29 DIAGNOSIS — G8929 Other chronic pain: Secondary | ICD-10-CM | POA: Diagnosis not present

## 2022-04-29 NOTE — Therapy (Signed)
OUTPATIENT PHYSICAL THERAPY THORACOLUMBAR TREATMENT   Patient Name: Dana Livingston MRN: [id number] DOB:[birth year], 51 y.o., female Today's Date: 04/29/2022   PT End of Session - 04/29/22 1521     Visit Number 3    Number of Visits 17    Date for PT Re-Evaluation 07/05/22    Authorization - Visit Number 3    Authorization - Number of Visits 10    Progress Note Due on Visit 10    PT Start Time 1510    PT Stop Time 5997    PT Time Calculation (min) 38 min    Activity Tolerance Patient tolerated treatment well    Behavior During Therapy [facility] for tasks assessed/performed              Past Medical History:  Diagnosis Date   Anxiety    Complication of anesthesia 1989   for wisdom teeth extracted in office, could not get her under enough so had to do at hospital   Fibroids    Hypertension    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 2001   normal pap smears since 2002   Panic attacks    Past Surgical History:  Procedure Laterality Date   [place] Left 02/14/2015   Procedure: ROBOTIC ASSISTED DRAINAGE LEFT OVARIAN CYST; FULAGRATION ON PERITONEAL ENDOMETRIOSIS;  Surgeon: [name], MD;  Location: [facility];  Service: Gynecology;  Laterality: Left;   ROBOTIC ASSISTED SALPINGO OOPHERECTOMY Right 02/14/2015   Procedure: ROBOTIC ASSISTED RIGHT SALPINGO OOPHORECTOMY;  Surgeon: [name], MD;  Location: [facility];  Service: Gynecology;  Laterality: Right;   TONSILLECTOMY  1981   WISDOM TOOTH EXTRACTION     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 Completed HEP while out from PT. Has no pain today.   PERTINENT HISTORY:  Pt is a 51 year old female presenting with R sided hip pain, referral for LBP with sciatica. Pt reports pain began NYE with insidious onset, and has gotten much better. Pt reports pain is more of a cramping location on the lateral hip when she pulls her leg toward her. Reports she has previously had sacral pain but that has improved. After initial injury she has been completing exercises such as deadbug and cat/cow, and changing her mattress which has greatly help. Denies n/t down post RLE, reports she does have some medial thigh numbness when she lays on her L side. Pt describes pain as "nerve injury" repeatedly, using word descriptors of MSK nature (cramp, ache, tension). Current and best pain 1/10; worst 5/10. Pain is exacerbated by leaning on R hip, crossing leg to don/doff shoes/ paint toe nails. Pt works full time as a Programmer, systems, with a good ergonomic set up, reports no pain/cramping with this with changing position to sit. Pt enjoys hiking, and gardening.  Pt denies N/V, B&B changes, unexplained weight fluctuation, saddle paresthesia, fever, night sweats, or unrelenting night pain at this time.   PAIN:  Are you having pain? Yes: NPRS scale: 1/10 Pain location: lateral hip Pain description: cramp, tension  Aggravating  factors: leaning on RLE, pulling R hip up to don/doff shoes (hip flex) Relieving factors: straightening my leg back out   PRECAUTIONS: None  WEIGHT BEARING RESTRICTIONS No  FALLS:  Has patient fallen in last 6 months? 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  PLOF: Independent  PATIENT GOALS To improve my ROM to be able to paint my toe nails   OBJECTIVE:   DIAGNOSTIC FINDINGS:  Xray previously patient reports MD reported "not able to discern if my nerve injury is due to disc or muscle strain"  PATIENT SURVEYS:  FOTO 55 goal of 59  SCREENING FOR RED FLAGS: Bowel or bladder incontinence: No Spinal tumors: No Cauda equina syndrome: No Compression fracture: No Abdominal aneurysm: No  COGNITION:  Overall cognitive status: Within functional limits for tasks assessed     SENSATION: WFL  MUSCLE LENGTH: Hamstrings: Right  WNLdeg; Left WNL deg Thomas test: Right WNL deg; Left WNL with pain   POSTURE: decreased lumbar lordosis and weight shift left  PALPATION: TTP with concordant pain sign to greater trochanter TTP along ITB to distal insertion Tension, trigger points, and pain to superior glute max, glute med and TFL Minimal tenderness to deep palpation of hip flexor/adductor groups  LUMBAR ROM:   Active  A/PROM  eval  Flexion WNL  Extension WNL  Right lateral flexion WNL  Left lateral flexion WNL  Right rotation WNL  Left rotation WNL   (Blank rows = not tested)  LOWER EXTREMITY ROM:     All BLE motions WNL with concordant pain to FADIR position  LOWER EXTREMITY MMT:    MMT Right eval Left eval  Hip flexion 4+ 5  Hip extension 4 5  Hip abduction 4+ 5  Hip adduction    Hip internal rotation 5 5  Hip external rotation 5 5  Knee flexion 5 5  Knee extension 5 5  Ankle  dorsiflexion    Ankle plantarflexion    Ankle inversion    Ankle eversion     1RM leg press R: 70# L 85# SL STS R:11  L: 11  (Blank rows = not tested)  LUMBAR SPECIAL TESTS:  Straight leg raise test: Negative, Slump test: Negative, and FABER test: Negative  FUNCTIONAL TESTS:  5 times sit to stand: 9sec 10 meter walk test: 1.34m GAIT: Distance walked: 222mAssistive device utilized: None Level of assistance: Complete Independence Comments: slight decrease in R stance time; slight R trunk rotation and lateral flex maintained throughout gait    TODAY'S TREATMENT  Nustep seat 7 UE 9 L3 2m53mfor gentle rotation and strengthening R SL bridge 2x 10 with cuing for glute max activation with good carry over  SL squat 2x 10 to mat table with cuing to prevent knee valgus R runners lunge 2x 10 with TC at lateral knee to prevent  Hip hinge with 10# DB x10; with OMEGA 25# x10 with good carry with min VC needed Piriformis stretch 30sec 2-3x/day   PT reviewed the following HEP with patient with patient able to demonstrate a set of the following with min cuing for correction needed. PT educated patient on parameters of therex (how/when to inc/decrease intensity, frequency, rep/set range, stretch hold time, and purpose of therex) with verbalized understanding.  Piriformis stretch 30sec 2-3x/day Hip flexor stretch half kneeling 30sec 2-3x/day   PATIENT EDUCATION:  Education details: Patient was educated on diagnosis, anatomy and pathology involved, prognosis, role of PT, and was given an HEP, demonstrating exercise with proper form following verbal and tactile cues, and was given a paper hand out to continue exercise at home. Pt was educated on and agreed to plan of care. Person educated: Patient Education method: Explanation, Demonstration, Tactile cues, Verbal cues, and Handouts Education comprehension: verbalized understanding and returned demonstration   HOME EXERCISE  PROGRAM: Piriformis stretch and half kneeling hip flexor stretch   ASSESSMENT:  CLINICAL IMPRESSION: PT initiated therex progression for increased hip and core stability in functional lifting with success. PT increased LLE single leg demand for increased progressive load with success. Pt is able to comply with all cuing for proper technique of therex with good effort throughout session. No increased pain. PT will continue progression as able.     OBJECTIVE IMPAIRMENTS Abnormal gait, decreased activity tolerance, decreased balance, decreased cognition, decreased coordination, decreased endurance, decreased mobility, difficulty walking, decreased strength, increased fascial restrictions, impaired perceived functional ability, increased muscle spasms, impaired flexibility, impaired UE functional use, improper body mechanics, postural dysfunction, and pain.   ACTIVITY LIMITATIONS lifting, bending, sitting, squatting, sleeping, and dressing  PARTICIPATION LIMITATIONS: cleaning, laundry, shopping, and community activity  PERSONAL FACTORS Fitness, Past/current experiences, Profession, Time since onset of injury/illness/exacerbation, and 3+ comorbidities: anxiety, HTN, obesity  are also affecting patient's functional outcome.   REHAB POTENTIAL: Good  CLINICAL DECISION MAKING: Evolving/moderate complexity  EVALUATION COMPLEXITY: Moderate   GOALS: Goals reviewed with patient? Yes  SHORT TERM GOALS: Target date: 05/27/2022  Pt will be independent with HEP in order to improve strength and balance in order to decrease fall risk and improve function at home and work.  Baseline: HEP given  Goal status: INITIAL   LONG TERM GOALS: Target date: 06/24/2022  Patient will increase FOTO score to 83 to demonstrate predicted increase in functional mobility to complete ADLs Baseline: 55 Goal status: INITIAL  2.  Pt will decrease worst pain  as reported on NPRS by at least 3 points in order to  demonstrate clinically significant reduction in pain.  Baseline: 5/10 Goal status: INITIAL  3.  Pt will demonstrate RLE leg press of 76.5# or more in order to demonstrate 90% strength of unaffected LE needed for heavy ADLs Baseline: 70# Goal status: INITIAL  4.  Pt will demonstrate gross R hip MMT of 5/5 to demonstrate PLOF needed for heavy ADLs Baseline: see eval Goal status: INITIAL     PLAN: PT FREQUENCY: 1-2x/week  PT DURATION: 8 weeks  PLANNED INTERVENTIONS: Therapeutic exercises, Therapeutic activity, Neuromuscular re-education, Balance training, Gait training, Patient/Family education, Self Care, Joint mobilization, Joint manipulation, Stair training, Dry Needling, Electrical stimulation, Spinal manipulation, Spinal mobilization, Cryotherapy, Moist heat, Traction, Ultrasound, Manual therapy, and Re-evaluation.  PLAN FOR NEXT SESSION: possible TPDN  Durwin Reges DPT  Durwin Reges, PT 04/29/2022, 3:57 PM

## 2022-05-01 ENCOUNTER — Encounter: Payer: Self-pay | Admitting: Physical Therapy

## 2022-05-01 ENCOUNTER — Ambulatory Visit: Payer: BC Managed Care – PPO | Admitting: Physical Therapy

## 2022-05-01 DIAGNOSIS — M25551 Pain in right hip: Secondary | ICD-10-CM

## 2022-05-01 DIAGNOSIS — G8929 Other chronic pain: Secondary | ICD-10-CM | POA: Diagnosis not present

## 2022-05-01 DIAGNOSIS — M5440 Lumbago with sciatica, unspecified side: Secondary | ICD-10-CM | POA: Diagnosis not present

## 2022-05-01 NOTE — Therapy (Signed)
OUTPATIENT PHYSICAL THERAPY THORACOLUMBAR TREATMENT   Patient Name: Dana Livingston MRN: [id number] DOB:03-17-1971, 51 y.o., female Today's Date: 05/01/2022   PT End of Session - 05/01/22 1522     Visit Number 4    Number of Visits 17    Date for PT Re-Evaluation 07/05/22    Authorization - Visit Number 4    Authorization - Number of Visits 10    Progress Note Due on Visit 10    PT Start Time 7672    PT Stop Time 0947    PT Time Calculation (min) 40 min    Activity Tolerance Patient tolerated treatment well    Behavior During Therapy [facility] for tasks assessed/performed               Past Medical History:  Diagnosis Date   Anxiety    Complication of anesthesia 1989   for wisdom teeth extracted in office, could not get her under enough so had to do at hospital   Fibroids    Hypertension    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 2001   normal pap smears since 2002   Panic attacks    Past Surgical History:  Procedure Laterality Date   [place] Left 02/14/2015   Procedure: ROBOTIC ASSISTED DRAINAGE LEFT OVARIAN CYST; FULAGRATION ON PERITONEAL ENDOMETRIOSIS;  Surgeon: [name], MD;  Location: [facility];  Service: Gynecology;  Laterality: Left;   ROBOTIC ASSISTED SALPINGO OOPHERECTOMY Right 02/14/2015   Procedure: ROBOTIC ASSISTED RIGHT SALPINGO OOPHORECTOMY;  Surgeon: [name], MD;  Location: [facility];  Service: Gynecology;  Laterality: Right;   TONSILLECTOMY  1981   WISDOM TOOTH EXTRACTION     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 Pt reports no pain other than cramping of the inside of her thigh last night. Reports putting on her shoes is getting better.   PERTINENT HISTORY:  Pt is a 51 year old female presenting with R sided hip pain, referral for LBP with sciatica. Pt reports pain began NYE with insidious onset, and has gotten much better. Pt reports pain is more of a cramping location on the lateral hip when she pulls her leg toward her. Reports she has previously had sacral pain but that has improved. After initial injury she has been completing exercises such as deadbug and cat/cow, and changing her mattress which has greatly help. Denies n/t down post RLE, reports she does have some medial thigh numbness when she lays on her L side. Pt describes pain as "nerve injury" repeatedly, using word descriptors of MSK nature (cramp, ache, tension). Current and best pain 1/10; worst 5/10. Pain is exacerbated by leaning on R hip, crossing leg to don/doff shoes/ paint toe nails. Pt works full time as a Programmer, systems, with a good ergonomic set up, reports no pain/cramping with this with changing position to sit. Pt enjoys hiking, and gardening.  Pt denies N/V, B&B changes, unexplained weight fluctuation, saddle paresthesia, fever, night sweats, or unrelenting night pain at this time.   PAIN:  Are you having pain? Yes: NPRS scale: 1/10 Pain location: lateral hip Pain description: cramp, tension   Aggravating factors: leaning on RLE, pulling R hip up to don/doff shoes (hip flex) Relieving factors: straightening my leg back out   PRECAUTIONS: None  WEIGHT BEARING RESTRICTIONS No  FALLS:  Has patient fallen in last 6 months? 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  PLOF: Independent  PATIENT GOALS To improve my ROM to be able to paint my toe nails   OBJECTIVE:   DIAGNOSTIC FINDINGS:  Xray previously patient reports MD reported "not able to discern if my nerve injury is due to disc or muscle strain"  PATIENT SURVEYS:  FOTO 55 goal of 63  SCREENING FOR RED FLAGS: Bowel or bladder incontinence: No Spinal tumors: No Cauda equina syndrome: No Compression fracture: No Abdominal aneurysm: No  COGNITION:  Overall cognitive status: Within functional limits for tasks assessed     SENSATION: WFL  MUSCLE LENGTH: Hamstrings: Right  WNLdeg; Left WNL deg Thomas test: Right WNL deg; Left WNL with pain   POSTURE: decreased lumbar lordosis and weight shift left  PALPATION: TTP with concordant pain sign to greater trochanter TTP along ITB to distal insertion Tension, trigger points, and pain to superior glute max, glute med and TFL Minimal tenderness to deep palpation of hip flexor/adductor groups  LUMBAR ROM:   Active  A/PROM  eval  Flexion WNL  Extension WNL  Right lateral flexion WNL  Left lateral flexion WNL  Right rotation WNL  Left rotation WNL   (Blank rows = not tested)  LOWER EXTREMITY ROM:     All BLE motions WNL with concordant pain to FADIR position  LOWER EXTREMITY MMT:    MMT Right eval Left eval  Hip flexion 4+ 5  Hip extension 4 5  Hip abduction 4+ 5  Hip adduction    Hip internal rotation 5 5  Hip external rotation 5 5  Knee flexion 5 5  Knee  extension 5 5  Ankle dorsiflexion    Ankle plantarflexion    Ankle inversion    Ankle eversion     1RM leg press R: 70# L 85# SL STS R:11  L: 11  (Blank rows = not tested)  LUMBAR SPECIAL TESTS:  Straight leg raise test: Negative, Slump test: Negative, and FABER test: Negative  FUNCTIONAL TESTS:  5 times sit to stand: 9sec 10 meter walk test: 1.13m GAIT: Distance walked: 267mAssistive device utilized: None Level of assistance: Complete Independence Comments: slight decrease in R stance time; slight R trunk rotation and lateral flex maintained throughout gait    TODAY'S TREATMENT  Nustep seat 7 UE 9 L2 21m59mfor gentle rotation and strengthening Bridge on bosu hardside x10; R SL only 2 x10 with good carry over of technique SL squat 2x 10/2 to elevated mat table with cuing to prevent knee valgus- at second rep of last set reports pain in R lumbar paraspinals  L lateral childs pose 30sec Cat/cow x12 with cuing for breath control SKTC 3x each LE 10sec Lower trunk rotations x20  Single leg RDL BW 2x 10 with good carry over of demo    PATIENT EDUCATION:  Education details: Patient was educated on diagnosis, anatomy and pathology involved, prognosis, role of PT, and was given an HEP, demonstrating exercise with proper form following verbal and tactile cues, and was given a paper hand out to continue exercise at home. Pt was educated on and agreed to plan of care. Person educated: Patient Education method: Explanation, Demonstration, Tactile cues, Verbal cues, and Handouts Education comprehension: verbalized understanding and returned demonstration   HOME EXERCISE PROGRAM: Piriformis stretch and half kneeling hip flexor stretch   ASSESSMENT:  CLINICAL IMPRESSION: PT initiated therex progression for increased hip and core stability in functional lifting with success. Pt with moment of increased R side lumbar paraspinal/QL tension/pain with squat exercise, most likely from  increased lumbar ext to stand; resolves with mobility/stretching therex. Patient is able to comply with all cuing for proper technique of therex with no increased pain throughout session. PT will continue progression as able.     OBJECTIVE IMPAIRMENTS Abnormal gait, decreased activity tolerance, decreased balance, decreased cognition, decreased coordination, decreased endurance, decreased mobility, difficulty walking, decreased strength, increased fascial restrictions, impaired perceived functional ability, increased muscle spasms, impaired flexibility, impaired UE functional use, improper body mechanics, postural dysfunction, and pain.   ACTIVITY LIMITATIONS lifting, bending, sitting, squatting, sleeping, and dressing  PARTICIPATION LIMITATIONS: cleaning, laundry, shopping, and community activity  PERSONAL FACTORS Fitness, Past/current experiences, Profession, Time since onset of injury/illness/exacerbation, and 3+ comorbidities: anxiety, HTN, obesity  are also affecting patient's functional outcome.   REHAB POTENTIAL: Good  CLINICAL DECISION MAKING: Evolving/moderate complexity  EVALUATION COMPLEXITY: Moderate   GOALS: Goals reviewed with patient? Yes  SHORT TERM GOALS: Target date: 05/29/2022  Pt will be independent with HEP in order to improve strength and balance in order to decrease fall risk and improve function at home and work.  Baseline: HEP given  Goal status: INITIAL   LONG TERM GOALS: Target date: 06/26/2022  Patient will increase FOTO score to 83 to demonstrate predicted increase in functional mobility to complete ADLs Baseline: 55 Goal status: INITIAL  2.  Pt will decrease worst pain as reported on NPRS by at least 3 points in order to demonstrate clinically significant reduction in pain.  Baseline: 5/10 Goal status: INITIAL  3.  Pt will demonstrate RLE leg press of 76.5# or more  in order to demonstrate 90% strength of unaffected LE needed for heavy  ADLs Baseline: 70# Goal status: INITIAL  4.  Pt will demonstrate gross R hip MMT of 5/5 to demonstrate PLOF needed for heavy ADLs Baseline: see eval Goal status: INITIAL     PLAN: PT FREQUENCY: 1-2x/week  PT DURATION: 8 weeks  PLANNED INTERVENTIONS: Therapeutic exercises, Therapeutic activity, Neuromuscular re-education, Balance training, Gait training, Patient/Family education, Self Care, Joint mobilization, Joint manipulation, Stair training, Dry Needling, Electrical stimulation, Spinal manipulation, Spinal mobilization, Cryotherapy, Moist heat, Traction, Ultrasound, Manual therapy, and Re-evaluation.  PLAN FOR NEXT SESSION: possible TPDN  Durwin Reges DPT  Durwin Reges, PT 05/01/2022, 3:56 PM

## 2022-05-06 ENCOUNTER — Ambulatory Visit: Payer: BC Managed Care – PPO | Admitting: Physical Therapy

## 2022-05-06 ENCOUNTER — Encounter: Payer: Self-pay | Admitting: Physical Therapy

## 2022-05-06 DIAGNOSIS — G8929 Other chronic pain: Secondary | ICD-10-CM | POA: Diagnosis not present

## 2022-05-06 DIAGNOSIS — M25551 Pain in right hip: Secondary | ICD-10-CM

## 2022-05-06 DIAGNOSIS — M5440 Lumbago with sciatica, unspecified side: Secondary | ICD-10-CM | POA: Diagnosis not present

## 2022-05-06 NOTE — Therapy (Signed)
OUTPATIENT PHYSICAL THERAPY THORACOLUMBAR TREATMENT   Patient Name: Dana Livingston MRN: [id number] DOB:[birth year], 51 y.o., female Today's Date: 05/08/2022       Past Medical History:  Diagnosis Date   Anxiety    Complication of anesthesia 1989   for wisdom teeth extracted in office, could not get her under enough so had to do at hospital   Fibroids    Hypertension    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 2001   normal pap smears since 2002   Panic attacks    Past Surgical History:  Procedure Laterality Date   CHOLECYSTECTOMY  1991   LAPAROSCOPIC OVARIAN CYSTECTOMY Left 1998   LASIK  2001   ROBOTIC ASSISTED LAPAROSCOPIC OVARIAN CYSTECTOMY Left 02/14/2015   Procedure: ROBOTIC ASSISTED DRAINAGE LEFT OVARIAN CYST; FULAGRATION ON PERITONEAL ENDOMETRIOSIS;  Surgeon: [name], MD;  Location: [facility];  Service: Gynecology;  Laterality: Left;   ROBOTIC ASSISTED SALPINGO OOPHERECTOMY Right 02/14/2015   Procedure: ROBOTIC ASSISTED RIGHT SALPINGO OOPHORECTOMY;  Surgeon: [name], MD;  Location: [facility];  Service: Gynecology;  Laterality: Right;   TONSILLECTOMY  1981   WISDOM TOOTH EXTRACTION     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 Reports she pulled a muscle during the last session in her back and that this is the injury that messes up her nerves and previously injured her so badly she was out of work. Rates pain 0/10 on NPRS - with some soreness in her back.   PERTINENT HISTORY:  Pt is a 51 year old female presenting with R sided hip pain, referral for LBP with sciatica. Pt reports pain began NYE with insidious onset, and has gotten much better.  Pt reports pain is more of a cramping location on the lateral hip when she pulls her leg toward her. Reports she has previously had sacral pain but that has improved. After initial injury she has been completing exercises such as deadbug and cat/cow, and changing her mattress which has greatly help. Denies n/t down post RLE, reports she does have some medial thigh numbness when she lays on her L side. Pt describes pain as "nerve injury" repeatedly, using word descriptors of MSK nature (cramp, ache, tension). Current and best pain 1/10; worst 5/10. Pain is exacerbated by leaning on R hip, crossing leg to don/doff shoes/ paint toe nails. Pt works full time as a Programmer, systems, with a good ergonomic set up, reports no pain/cramping with this with changing position to sit. Pt enjoys hiking, and gardening.  Pt denies N/V, B&B changes, unexplained weight fluctuation, saddle paresthesia, fever, night sweats, or unrelenting night pain at this time.   PAIN:  Are you having pain? Yes: NPRS scale: 1/10 Pain location: lateral hip Pain description: cramp, tension  Aggravating factors: leaning on RLE, pulling R hip up to don/doff shoes (hip flex) Relieving factors: straightening my leg back out   PRECAUTIONS: None  WEIGHT BEARING RESTRICTIONS No  FALLS:  Has patient fallen in last 6 months? No  LIVING ENVIRONMENT: Lives with: lives with their  spouse Lives in: House/apartment Stairs: Yes: External: 3 steps; bilateral but cannot reach both Has following equipment at home: None  OCCUPATION: full time [employer]  PLOF: Independent  PATIENT GOALS To improve my ROM to be able to paint my toe nails   OBJECTIVE:   DIAGNOSTIC FINDINGS:  Xray previously patient reports MD reported "not able to discern if my nerve injury is due to disc or muscle strain"  PATIENT SURVEYS:  FOTO 55 goal of 14  SCREENING FOR RED FLAGS: Bowel or bladder incontinence: No Spinal tumors: No Cauda equina syndrome: No Compression fracture: No Abdominal aneurysm: No  COGNITION:  Overall cognitive status: Within functional limits for tasks assessed     SENSATION: WFL  MUSCLE LENGTH: Hamstrings: Right  WNLdeg; Left WNL deg Thomas test: Right WNL deg; Left WNL with pain   POSTURE: decreased lumbar lordosis and weight shift left  PALPATION: TTP with concordant pain sign to greater trochanter TTP along ITB to distal insertion Tension, trigger points, and pain to superior glute max, glute med and TFL Minimal tenderness to deep palpation of hip flexor/adductor groups  LUMBAR ROM:   Active  A/PROM  eval  Flexion WNL  Extension WNL  Right lateral flexion WNL  Left lateral flexion WNL  Right rotation WNL  Left rotation WNL   (Blank rows = not tested)  LOWER EXTREMITY ROM:     All BLE motions WNL with concordant pain to FADIR position  LOWER EXTREMITY MMT:    MMT Right eval Left eval  Hip flexion 4+ 5  Hip extension 4 5  Hip abduction 4+ 5  Hip adduction    Hip internal rotation 5 5  Hip external rotation 5 5  Knee flexion 5 5  Knee extension 5 5  Ankle dorsiflexion    Ankle plantarflexion    Ankle inversion    Ankle eversion     1RM leg press R: 70# L 85# SL STS R:11  L: 11  (Blank rows = not tested)  LUMBAR SPECIAL TESTS:  Straight leg raise test: Negative, Slump test: Negative, and FABER test: Negative  FUNCTIONAL  TESTS:  5 times sit to  stand: 9sec 10 meter walk test: 1.42m GAIT: Distance walked: 226mAssistive device utilized: None Level of assistance: Complete Independence Comments: slight decrease in R stance time; slight R trunk rotation and lateral flex maintained throughout gait    TODAY'S TREATMENT  Nustep seat 7 UE 9 L2 [id number] gentle rotation and strengthening Alt lunge x12 with patient reporting pain in her hip inquiring if therapy is supposed to get worse before it gets better- exercise ceased Bridge with GTB abd activation 3x 10 with patient reporting no pain with this exercise Prone alt supermans 2 x12 with good carry over of cuing for proper technique  PT reviewed the following HEP with patient with patient able to demonstrate a set of the following with min cuing for correction needed. PT educated patient on parameters of therex (how/when to inc/decrease intensity, frequency, rep/set range, stretch hold time, and purpose of therex) with verbalized understanding.  Access Code: [email or web address] - Supine Single Knee to Chest Stretch  - 1-3 x daily - 7 x weekly - 10-30sec hold - Supine Lower Trunk Rotation  - 1 x daily - 7 x weekly - 20 reps - 2sec hold - Supine Bridge  - 1 x daily - 7 x weekly - 2 sets - 12-20 reps  Subsequent attempt at lunge with some improvement from first attempt Piriformis stretch 30sec hold   PATIENT EDUCATION:  Education details: Patient was educated on diagnosis, anatomy and pathology involved, prognosis, role of PT, and was given an HEP, demonstrating exercise with proper form following verbal and tactile cues, and was given a paper hand out to continue exercise at home. Pt was educated on and agreed to plan of care. Person educated: Patient Education method: Explanation, Demonstration, Tactile cues, Verbal cues, and Handouts Education comprehension: verbalized understanding and returned demonstration   HOME EXERCISE PROGRAM: Piriformis stretch and half  kneeling hip flexor stretch   ASSESSMENT:  CLINICAL IMPRESSION: Pt arrives to session concerned about pulling a muscle that could led to her previous nerve pain that was very debilitating, reporting she had pain following last session that has since resolved. Pt initially un-accepting to education on muscle soreness/fatigue vs. Tissue damage/threat. Pt reports pain localization to glute med/superior glute max with PT palpation, with PT continuing to educate her on the location of pain, increased muscle tension coinciding with muscle activation and fatigue with some eventual acceptance. Education on nature of PT as a 6-8 week progress needed for tissue change, and goal of PT to strengthen glute med/TFL to stability and prevent ITB tendinitis/GT bursitis, and to activate stronger glute max > deep rotators (motor control). Patient is able to comply with all cuing for proper technique of therex with therex modified within pain range. PT will continue progression as able.     OBJECTIVE IMPAIRMENTS Abnormal gait, decreased activity tolerance, decreased balance, decreased cognition, decreased coordination, decreased endurance, decreased mobility, difficulty walking, decreased strength, increased fascial restrictions, impaired perceived functional ability, increased muscle spasms, impaired flexibility, impaired UE functional use, improper body mechanics, postural dysfunction, and pain.   ACTIVITY LIMITATIONS lifting, bending, sitting, squatting, sleeping, and dressing  PARTICIPATION LIMITATIONS: cleaning, laundry, shopping, and community activity  PERSONAL FACTORS Fitness, Past/current experiences, Profession, Time since onset of injury/illness/exacerbation, and 3+ comorbidities: anxiety, HTN, obesity  are also affecting patient's functional outcome.   REHAB POTENTIAL: Good  CLINICAL DECISION MAKING: Evolving/moderate complexity  EVALUATION COMPLEXITY: Moderate   GOALS: Goals reviewed with patient?  Yes  SHORT TERM GOALS: Target date: 06/05/2022  Pt will be independent with HEP in order to improve strength and balance in order to decrease fall risk and improve function at home and work.  Baseline: HEP given  Goal status: INITIAL   LONG TERM GOALS: Target date: 07/03/2022  Patient will increase FOTO score to 83 to demonstrate predicted increase in functional mobility to complete ADLs Baseline: 55 Goal status: INITIAL  2.  Pt will decrease worst pain as reported on NPRS by at least 3 points in order to demonstrate clinically significant reduction in pain.  Baseline: 5/10 Goal status: INITIAL  3.  Pt will demonstrate RLE leg press of 76.5# or more in order to demonstrate 90% strength of unaffected LE needed for heavy ADLs Baseline: 70# Goal status: INITIAL  4.  Pt will demonstrate gross R hip MMT of 5/5 to demonstrate PLOF needed for heavy ADLs Baseline: see eval Goal status: INITIAL     PLAN: PT FREQUENCY: 1-2x/week  PT DURATION: 8 weeks  PLANNED INTERVENTIONS: Therapeutic exercises, Therapeutic activity, Neuromuscular re-education, Balance training, Gait training, Patient/Family education, Self Care, Joint mobilization, Joint manipulation, Stair training, Dry Needling, Electrical stimulation, Spinal manipulation, Spinal mobilization, Cryotherapy, Moist heat, Traction, Ultrasound, Manual therapy, and Re-evaluation.  PLAN FOR NEXT SESSION: possible TPDN  Durwin Reges DPT  Durwin Reges, PT 05/08/2022, 3:20 PM

## 2022-05-08 ENCOUNTER — Ambulatory Visit: Payer: BC Managed Care – PPO | Admitting: Physical Therapy

## 2022-05-08 ENCOUNTER — Encounter: Payer: Self-pay | Admitting: Physical Therapy

## 2022-05-08 DIAGNOSIS — M5440 Lumbago with sciatica, unspecified side: Secondary | ICD-10-CM | POA: Diagnosis not present

## 2022-05-08 DIAGNOSIS — G8929 Other chronic pain: Secondary | ICD-10-CM | POA: Diagnosis not present

## 2022-05-08 DIAGNOSIS — M25551 Pain in right hip: Secondary | ICD-10-CM

## 2022-05-08 NOTE — Therapy (Signed)
OUTPATIENT PHYSICAL THERAPY THORACOLUMBAR TREATMENT   Patient Name: Dana Livingston MRN: [id number] DOB:1970/10/28, 51 y.o., female Today's Date: 05/08/2022   PT End of Session - 05/08/22 1522     Visit Number 6    Number of Visits 17    Date for PT Re-Evaluation 07/05/22    Authorization - Visit Number 6    Authorization - Number of Visits 10    Progress Note Due on Visit 10    PT Start Time 1519    PT Stop Time 4562    PT Time Calculation (min) 38 min    Activity Tolerance Patient tolerated treatment well    Behavior During Therapy [facility] for tasks assessed/performed                Past Medical History:  Diagnosis Date   Anxiety    Complication of anesthesia 1989   for wisdom teeth extracted in office, could not get her under enough so had to do at hospital   Fibroids    Hypertension    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 2001   normal pap smears since 2002   Panic attacks    Past Surgical History:  Procedure Laterality Date   Sublimity Left 02/14/2015   Procedure: ROBOTIC ASSISTED DRAINAGE LEFT OVARIAN CYST; FULAGRATION ON PERITONEAL ENDOMETRIOSIS;  Surgeon: [name], MD;  Location: [facility];  Service: Gynecology;  Laterality: Left;   ROBOTIC ASSISTED SALPINGO OOPHERECTOMY Right 02/14/2015   Procedure: ROBOTIC ASSISTED RIGHT SALPINGO OOPHORECTOMY;  Surgeon: [name], MD;  Location: [facility];  Service: Gynecology;  Laterality: Right;   TONSILLECTOMY  1981   WISDOM TOOTH EXTRACTION     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 She did some stretching yesterday and this helped her feel better. Not having any pain anywhere today. Compliance with HEP with a couple additional stretches as well.   PERTINENT HISTORY:  Pt is a 51 year old female presenting with R sided hip pain, referral for LBP with sciatica. Pt reports pain began NYE with insidious onset, and has gotten much better. Pt reports pain is more of a cramping location on the lateral hip when she pulls her leg toward her. Reports she has previously had sacral pain but that has improved. After initial injury she has been completing exercises such as deadbug and cat/cow, and changing her mattress which has greatly help. Denies n/t down post RLE, reports she does have some medial thigh numbness when she lays on her L side. Pt describes pain as "nerve injury" repeatedly, using word descriptors of MSK nature (cramp, ache, tension). Current and best pain 1/10; worst 5/10. Pain is exacerbated by leaning on R hip, crossing leg to don/doff shoes/ paint toe nails. Pt works full time as a Programmer, systems, with a good ergonomic set up, reports no pain/cramping with this with changing position to sit. Pt enjoys hiking, and gardening.  Pt denies N/V, B&B changes, unexplained weight fluctuation, saddle paresthesia, fever, night sweats, or unrelenting night pain at this time.   PAIN:  Are you having pain? Yes: NPRS scale: 1/10 Pain location: lateral hip Pain  description: cramp, tension  Aggravating factors: leaning on RLE, pulling R hip up to don/doff shoes (hip flex) Relieving factors: straightening my leg back out   PRECAUTIONS: None  WEIGHT BEARING RESTRICTIONS No  FALLS:  Has patient fallen in last 6 months? 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  PLOF: Independent  PATIENT GOALS To improve my ROM to be able to paint my toe nails   OBJECTIVE:   DIAGNOSTIC FINDINGS:  Xray previously patient reports MD reported "not able to discern if my nerve injury is due to disc or muscle strain"  PATIENT SURVEYS:  FOTO 55 goal of 46  SCREENING FOR RED FLAGS: Bowel or bladder incontinence: No Spinal tumors: No Cauda equina syndrome: No Compression fracture: No Abdominal aneurysm: No  COGNITION:  Overall cognitive status: Within functional limits for tasks assessed     SENSATION: WFL  MUSCLE LENGTH: Hamstrings: Right  WNLdeg; Left WNL deg Thomas test: Right WNL deg; Left WNL with pain   POSTURE: decreased lumbar lordosis and weight shift left  PALPATION: TTP with concordant pain sign to greater trochanter TTP along ITB to distal insertion Tension, trigger points, and pain to superior glute max, glute med and TFL Minimal tenderness to deep palpation of hip flexor/adductor groups  LUMBAR ROM:   Active  A/PROM  eval  Flexion WNL  Extension WNL  Right lateral flexion WNL  Left lateral flexion WNL  Right rotation WNL  Left rotation WNL   (Blank rows = not tested)  LOWER EXTREMITY ROM:     All BLE motions WNL with concordant pain to FADIR position  LOWER EXTREMITY MMT:    MMT Right eval Left eval  Hip flexion 4+ 5  Hip extension 4 5  Hip abduction 4+ 5  Hip adduction    Hip internal rotation 5 5  Hip external rotation 5 5   Knee flexion 5 5  Knee extension 5 5  Ankle dorsiflexion    Ankle plantarflexion    Ankle inversion    Ankle eversion     1RM leg press R: 70# L 85# SL STS R:11  L: 11  (Blank rows = not tested)  LUMBAR SPECIAL TESTS:  Straight leg raise test: Negative, Slump test: Negative, and FABER test: Negative  FUNCTIONAL TESTS:  5 times sit to stand: 9sec 10 meter walk test: 1.7m GAIT: Distance walked: 223mAssistive device utilized: None Level of assistance: Complete Independence Comments: slight decrease in R stance time; slight R trunk rotation and lateral flex maintained throughout gait    TODAY'S TREATMENT  Nustep seat 7 UE 9 L2 76m40mfor gentle rotation and strengthening Alt lateral lunge 2x 12 with good carry over of demo  Side bridge 2x 10 from feet with excellent carry over of demo  R lateral step down 3x 02/21/09 with good carry over of cuing; min cuing to decrease knee valgus Alt lunge x12 with patient reporting pain in her hip inquiring if therapy is supposed to get worse before it gets better- exercise ceased   Piriformis stretch 30sec hold Childs pose theraball 30sec hold  PATIENT EDUCATION:  Education details: Patient was educated on diagnosis, anatomy and pathology involved, prognosis, role of PT, and was given an HEP, demonstrating exercise with proper form following verbal and tactile cues, and was given a paper hand out to continue exercise at home. Pt was educated on and agreed to plan of care. Person educated: Patient Education method: Explanation, Demonstration, Tactile cues, Verbal cues, and Handouts Education comprehension: verbalized understanding and returned demonstration   HOME EXERCISE PROGRAM: Piriformis stretch and half kneeling hip flexor stretch   ASSESSMENT:  CLINICAL IMPRESSION: PT continued therex progression for increased lateral hip stability and strengthening with success. Pt is able to comply with all cuing for proper technique of  therex with good effort throughout session. Pt with no increased pain througout session. PT will continue progression as able.     OBJECTIVE IMPAIRMENTS Abnormal gait, decreased activity tolerance, decreased balance, decreased cognition, decreased coordination, decreased endurance, decreased mobility, difficulty walking, decreased strength, increased fascial restrictions, impaired perceived functional ability, increased muscle spasms, impaired flexibility, impaired UE functional use, improper body mechanics, postural dysfunction, and pain.   ACTIVITY LIMITATIONS lifting, bending, sitting, squatting, sleeping, and dressing  PARTICIPATION LIMITATIONS: cleaning, laundry, shopping, and community activity  PERSONAL FACTORS Fitness, Past/current experiences, Profession, Time since onset of injury/illness/exacerbation, and 3+ comorbidities: anxiety, HTN, obesity  are also affecting patient's functional outcome.   REHAB POTENTIAL: Good  CLINICAL DECISION MAKING: Evolving/moderate complexity  EVALUATION COMPLEXITY: Moderate   GOALS: Goals reviewed with patient? Yes  SHORT TERM GOALS: Target date: 06/05/2022  Pt will be independent with HEP in order to improve strength and balance in order to decrease fall risk and improve function at home and work.  Baseline: HEP given  Goal status: INITIAL   LONG TERM GOALS: Target date: 07/03/2022  Patient will increase FOTO score to 83 to demonstrate predicted increase in functional mobility to complete ADLs Baseline: 55 Goal status: INITIAL  2.  Pt will decrease worst pain as reported on NPRS by at least 3 points in order to demonstrate clinically significant reduction in pain.  Baseline: 5/10 Goal status: INITIAL  3.  Pt will demonstrate RLE leg press of 76.5# or more in order to demonstrate 90% strength of unaffected LE needed for heavy ADLs Baseline: 70# Goal status: INITIAL  4.  Pt will demonstrate gross R hip MMT of 5/5 to demonstrate  PLOF needed for heavy ADLs Baseline: see eval Goal status: INITIAL     PLAN: PT FREQUENCY: 1-2x/week  PT DURATION: 8 weeks  PLANNED INTERVENTIONS: Therapeutic exercises, Therapeutic activity, Neuromuscular re-education, Balance training, Gait training, Patient/Family education, Self Care, Joint mobilization, Joint manipulation, Stair training, Dry Needling, Electrical stimulation, Spinal manipulation, Spinal mobilization, Cryotherapy, Moist heat, Traction, Ultrasound, Manual therapy, and Re-evaluation.  PLAN FOR NEXT SESSION: possible TPDN  Durwin Reges DPT  Durwin Reges, PT 05/08/2022, 3:56 PM

## 2022-05-13 ENCOUNTER — Ambulatory Visit: Payer: BC Managed Care – PPO | Admitting: Physical Therapy

## 2022-05-13 DIAGNOSIS — G8929 Other chronic pain: Secondary | ICD-10-CM | POA: Diagnosis not present

## 2022-05-13 DIAGNOSIS — M25551 Pain in right hip: Secondary | ICD-10-CM

## 2022-05-13 DIAGNOSIS — M5440 Lumbago with sciatica, unspecified side: Secondary | ICD-10-CM | POA: Diagnosis not present

## 2022-05-13 NOTE — Therapy (Signed)
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 2001   normal pap smears since 2002   Panic attacks    Past Surgical History:  Procedure Laterality Date   CHOLECYSTECTOMY  1991   LAPAROSCOPIC OVARIAN CYSTECTOMY Left 1998   LASIK  2001   ROBOTIC ASSISTED LAPAROSCOPIC OVARIAN CYSTECTOMY Left 02/14/2015   Procedure: ROBOTIC ASSISTED DRAINAGE LEFT OVARIAN CYST; FULAGRATION ON PERITONEAL ENDOMETRIOSIS;  Surgeon: [name], MD;  Location: [facility];  Service: Gynecology;  Laterality: Left;   ROBOTIC ASSISTED SALPINGO OOPHERECTOMY Right 02/14/2015   Procedure: ROBOTIC ASSISTED RIGHT SALPINGO OOPHORECTOMY;  Surgeon: [name], MD;  Location: [facility];  Service: Gynecology;  Laterality: Right;   TONSILLECTOMY  1981   WISDOM TOOTH EXTRACTION     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   Elevated cancer antigen 125 (CA-125) 01/18/2015   Fibroids 01/18/2015    PCP: Lavon Paganini MD  REFERRING PROVIDER: Rory Percy DO  REFERRING DIAG: LBP  Rationale for Evaluation and Treatment Rehabilitation  THERAPY DIAG:  Pain in right hip  ONSET DATE: Jan 2023  SUBJECTIVE:                                                                                                                                                                                            SUBJECTIVE STATEMENT: Pt reports she is feeling pretty good overall, having some occassional "twinges in my back from sleeping too much". Pt reports no pain on NPRS. Reports compliance with HEP.   PERTINENT HISTORY:  Pt is a 51 year old female presenting with R sided hip pain, referral for LBP with sciatica. Pt reports pain began NYE with insidious onset, and has gotten much better. Pt reports pain is more of a cramping location on the lateral hip when she pulls her leg toward her. Reports she has previously had sacral pain but that has improved. After initial  injury she has been completing exercises such as deadbug and cat/cow, and changing her mattress which has greatly help. Denies n/t down post RLE, reports she does have some medial thigh numbness when she lays on her L side. Pt describes pain as "nerve injury" repeatedly, using word descriptors of MSK nature (cramp, ache, tension). Current and best pain 1/10; worst 5/10. Pain is exacerbated by leaning on R hip, crossing leg to don/doff shoes/ paint toe nails. Pt works full time as a Programmer, systems, with a good ergonomic set up, reports no pain/cramping with this with changing position to sit. Pt enjoys hiking, and gardening.  Pt denies N/V, B&B changes, unexplained weight fluctuation, saddle paresthesia, fever, night sweats, or unrelenting night pain at this time.   PAIN:  Are you having pain? Yes: NPRS scale: 1/10 Pain location: lateral hip Pain description: cramp, tension  Aggravating factors: leaning on RLE, pulling R hip up to don/doff shoes (hip flex) Relieving factors: straightening my leg back out   PRECAUTIONS: None  WEIGHT BEARING RESTRICTIONS No  FALLS:  Has patient fallen in last 6 months? 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  PLOF: Independent  PATIENT GOALS To improve my ROM to be able to paint my toe nails   OBJECTIVE:   DIAGNOSTIC FINDINGS:  Xray previously patient reports MD reported "not able to discern if my nerve injury is due to disc or muscle strain"  PATIENT SURVEYS:  FOTO 55 goal of 42  SCREENING FOR RED FLAGS: Bowel or bladder incontinence: No Spinal tumors: No Cauda equina syndrome: No Compression fracture: No Abdominal aneurysm: No  COGNITION:  Overall cognitive status: Within functional limits for tasks assessed     SENSATION: WFL  MUSCLE LENGTH: Hamstrings: Right  WNLdeg; Left WNL deg Thomas test: Right WNL deg; Left WNL with pain   POSTURE: decreased lumbar lordosis and weight shift left  PALPATION: TTP with concordant pain sign to greater trochanter TTP along ITB to distal insertion Tension, trigger points, and pain to superior glute max, glute med and TFL Minimal tenderness to deep palpation of hip flexor/adductor groups  LUMBAR ROM:   Active  A/PROM  eval  Flexion WNL  Extension WNL  Right lateral flexion WNL  Left lateral flexion WNL  Right rotation WNL  Left rotation WNL   (Blank rows = not tested)  LOWER EXTREMITY ROM:     All BLE motions WNL with concordant pain to FADIR position  LOWER EXTREMITY MMT:    MMT Right eval Left eval  Hip flexion 4+ 5  Hip extension 4 5  Hip abduction 4+ 5  Hip adduction    Hip internal rotation 5 5  Hip external rotation 5 5  Knee flexion 5 5  Knee extension 5 5  Ankle dorsiflexion    Ankle plantarflexion    Ankle inversion    Ankle eversion     1RM leg press R: 70# L 85# SL STS R:11  L: 11  (Blank rows = not tested)  LUMBAR SPECIAL TESTS:  Straight leg raise test: Negative, Slump test: Negative, and FABER test: Negative  FUNCTIONAL TESTS:  5 times sit to stand: 9sec 10 meter walk test: 1.41m GAIT: Distance walked: 26mAssistive device utilized: None Level of assistance:  Complete Independence Comments: slight decrease in R stance time; slight R trunk rotation  and lateral flex maintained throughout gait    TODAY'S TREATMENT  Nustep seat 7 UE 9 L2 90mn for gentle rotation and strengthening Abd on slider in mini squat 2x 10 each side with cuing for eccentric control wit good carry over Straight leg deadlift 10# DB x10; staggered straight leg deadlift 10# DB 2x 10  KB swing 3x 8 20# with good carry over of initially cuing for full hip ext with good carry over Carioca 2x [id number] for coordination; with increased speed x2; with increased speed with high knee forward x2   Piriformis stretch 30sec hold Childs pose theraball 30sec hold  PATIENT EDUCATION:  Education details: Patient was educated on diagnosis, anatomy and pathology involved, prognosis, role of PT, and was given an HEP, demonstrating exercise with proper form following verbal and tactile cues, and was given a paper hand out to continue exercise at home. Pt was educated on and agreed to plan of care. Person educated: Patient Education method: Explanation, Demonstration, Tactile cues, Verbal cues, and Handouts Education comprehension: verbalized understanding and returned demonstration   HOME EXERCISE PROGRAM: Piriformis stretch and half kneeling hip flexor stretch   ASSESSMENT:  CLINICAL IMPRESSION: PT continued therex progression for increased lateral hip stability and strengthening; with addition of power focus with success. Pt is able to comply with all cuing for proper technique of therex with good effort throughout session. Pt with no increased pain througout session. PT will continue progression as able.     OBJECTIVE IMPAIRMENTS Abnormal gait, decreased activity tolerance, decreased balance, decreased cognition, decreased coordination, decreased endurance, decreased mobility, difficulty walking, decreased strength, increased fascial restrictions, impaired perceived functional ability,  increased muscle spasms, impaired flexibility, impaired UE functional use, improper body mechanics, postural dysfunction, and pain.   ACTIVITY LIMITATIONS lifting, bending, sitting, squatting, sleeping, and dressing  PARTICIPATION LIMITATIONS: cleaning, laundry, shopping, and community activity  PERSONAL FACTORS Fitness, Past/current experiences, Profession, Time since onset of injury/illness/exacerbation, and 3+ comorbidities: anxiety, HTN, obesity  are also affecting patient's functional outcome.   REHAB POTENTIAL: Good  CLINICAL DECISION MAKING: Evolving/moderate complexity  EVALUATION COMPLEXITY: Moderate   GOALS: Goals reviewed with patient? Yes  SHORT TERM GOALS: Target date: 06/13/2022  Pt will be independent with HEP in order to improve strength and balance in order to decrease fall risk and improve function at home and work.  Baseline: HEP given  Goal status: INITIAL   LONG TERM GOALS: Target date: 07/11/2022  Patient will increase FOTO score to 83 to demonstrate predicted increase in functional mobility to complete ADLs Baseline: 55 Goal status: INITIAL  2.  Pt will decrease worst pain as reported on NPRS by at least 3 points in order to demonstrate clinically significant reduction in pain.  Baseline: 5/10 Goal status: INITIAL  3.  Pt will demonstrate RLE leg press of 76.5# or more in order to demonstrate 90% strength of unaffected LE needed for heavy ADLs Baseline: 70# Goal status: INITIAL  4.  Pt will demonstrate gross R hip MMT of 5/5 to demonstrate PLOF needed for heavy ADLs Baseline: see eval Goal status: INITIAL     PLAN: PT FREQUENCY: 1-2x/week  PT DURATION: 8 weeks  PLANNED INTERVENTIONS: Therapeutic exercises, Therapeutic activity, Neuromuscular re-education, Balance training, Gait training, Patient/Family education, Self Care, Joint mobilization, Joint manipulation, Stair training, Dry Needling, Electrical stimulation, Spinal manipulation,  Spinal mobilization, Cryotherapy, Moist heat, Traction, Ultrasound, Manual therapy, and Re-evaluation.  PLAN FOR NEXT SESSION: possible TPDN  ChDurwin RegesPT  ChDurwin RegesPT 05/16/2022, 2:34  PM

## 2022-05-16 ENCOUNTER — Ambulatory Visit: Payer: BC Managed Care – PPO | Attending: Family Medicine | Admitting: Physical Therapy

## 2022-05-16 DIAGNOSIS — M25551 Pain in right hip: Secondary | ICD-10-CM | POA: Diagnosis not present

## 2022-05-16 DIAGNOSIS — F4310 Post-traumatic stress disorder, unspecified: Secondary | ICD-10-CM | POA: Diagnosis not present

## 2022-05-16 NOTE — Therapy (Signed)
OUTPATIENT PHYSICAL THERAPY THORACOLUMBAR TREATMENT   Patient Name: Dana Livingston MRN: [id number] DOB:Dec 12, 1970, 51 y.o., female Today's Date: 05/16/2022   PT End of Session - 05/16/22 1435     Visit Number 8    Number of Visits 17    Date for PT Re-Evaluation 07/05/22    Authorization - Visit Number 8    Authorization - Number of Visits 10    Progress Note Due on Visit 10    PT Start Time 3235    PT Stop Time 1512    PT Time Calculation (min) 39 min    Activity Tolerance Patient tolerated treatment well    Behavior During Therapy [facility] for tasks assessed/performed                 Past Medical History:  Diagnosis Date   Anxiety    Complication of anesthesia 1989   for wisdom teeth extracted in office, could not get her under enough so had to do at hospital   Fibroids    Hypertension    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 2001   normal pap smears since 2002   Panic attacks    Past Surgical History:  Procedure Laterality Date   [place] Left 02/14/2015   Procedure: ROBOTIC ASSISTED DRAINAGE LEFT OVARIAN CYST; FULAGRATION ON PERITONEAL ENDOMETRIOSIS;  Surgeon: [name], MD;  Location: [facility];  Service: Gynecology;  Laterality: Left;   ROBOTIC ASSISTED SALPINGO OOPHERECTOMY Right 02/14/2015   Procedure: ROBOTIC ASSISTED RIGHT SALPINGO OOPHORECTOMY;  Surgeon: [name], MD;  Location: [facility];  Service: Gynecology;  Laterality: Right;   TONSILLECTOMY  1981   WISDOM TOOTH EXTRACTION     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 Denies increased back or hip pain. Continuing HEP without concern  PERTINENT HISTORY:  Pt is a 51 year old female presenting with R sided hip pain, referral for LBP with sciatica. Pt reports pain began NYE with insidious onset, and has gotten much better. Pt reports pain is more of a cramping location on the lateral hip when she pulls her leg toward her. Reports she has previously had sacral pain but that has improved. After initial injury she has been completing exercises such as deadbug and cat/cow, and changing her mattress which has greatly help. Denies n/t down post RLE, reports she does have some medial thigh numbness when she lays on her L side. Pt describes pain as "nerve injury" repeatedly, using word descriptors of MSK nature (cramp, ache, tension). Current and best pain 1/10; worst 5/10. Pain is exacerbated by leaning on R hip, crossing leg to don/doff shoes/ paint toe nails. Pt works full time as a Programmer, systems, with a good ergonomic set up, reports no pain/cramping with this with changing position to sit. Pt enjoys hiking, and gardening.  Pt denies N/V, B&B changes, unexplained weight fluctuation, saddle paresthesia, fever, night sweats, or unrelenting night pain at this time.   PAIN:  Are you having pain? Yes: NPRS scale: 1/10 Pain location: lateral hip Pain description: cramp, tension  Aggravating factors: leaning on RLE,  pulling R hip up to don/doff shoes (hip flex) Relieving factors: straightening my leg back out   PRECAUTIONS: None  WEIGHT BEARING RESTRICTIONS No  FALLS:  Has patient fallen in last 6 months? 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  PLOF: Independent  PATIENT GOALS To improve my ROM to be able to paint my toe nails   OBJECTIVE:   DIAGNOSTIC FINDINGS:  Xray previously patient reports MD reported "not able to discern if my nerve injury is due to disc or muscle strain"  PATIENT SURVEYS:  FOTO 55 goal of 21  SCREENING FOR RED FLAGS: Bowel or bladder incontinence: No Spinal tumors: No Cauda equina syndrome: No Compression fracture: No Abdominal aneurysm: No  COGNITION:  Overall cognitive status: Within functional limits for tasks assessed     SENSATION: WFL  MUSCLE LENGTH: Hamstrings: Right  WNLdeg; Left WNL deg Thomas test: Right WNL deg; Left WNL with pain   POSTURE: decreased lumbar lordosis and weight shift left  PALPATION: TTP with concordant pain sign to greater trochanter TTP along ITB to distal insertion Tension, trigger points, and pain to superior glute max, glute med and TFL Minimal tenderness to deep palpation of hip flexor/adductor groups  LUMBAR ROM:   Active  A/PROM  eval  Flexion WNL  Extension WNL  Right lateral flexion WNL  Left lateral flexion WNL  Right rotation WNL  Left rotation WNL   (Blank rows = not tested)  LOWER EXTREMITY ROM:     All BLE motions WNL with concordant pain to FADIR position  LOWER EXTREMITY MMT:    MMT Right eval Left eval  Hip flexion 4+ 5  Hip extension 4 5  Hip abduction 4+ 5  Hip adduction    Hip internal rotation 5 5  Hip external rotation 5 5  Knee flexion 5 5  Knee extension 5 5  Ankle dorsiflexion    Ankle  plantarflexion  Ankle inversion    Ankle eversion     1RM leg press R: 70# L 85# SL STS R:11  L: 11  (Blank rows = not tested)  LUMBAR SPECIAL TESTS:  Straight leg raise test: Negative, Slump test: Negative, and FABER test: Negative  FUNCTIONAL TESTS:  5 times sit to stand: 9sec 10 meter walk test: 1.433m GAIT: Distance walked: 269mAssistive device utilized: None Level of assistance: Complete Independence Comments: slight decrease in R stance time; slight R trunk rotation and lateral flex maintained throughout gait    TODAY'S TREATMENT  Nustep seat 7 UE 9 L2 [id number] gentle rotation and strengthening Alt squat with side step x12 (6 each direction); with RTB at knees 2x 10 (5 each direction Hip hike 3in step x10; 2x 12 with good carry of initial cuing/demo Farmers carry 20# KB suspended from grey TB 2x [id number] Mermaid sit hip openner x12 bilat with increased difficulty with with R hip IR Carioca 2x [id number] increased speed ; with increased speed with exaggerated twists x2   Piriformis stretch 30sec hold Childs pose theraball 30sec hold  PATIENT EDUCATION:  Education details: Patient was educated on diagnosis, anatomy and pathology involved, prognosis, role of PT, and was given an HEP, demonstrating exercise with proper form following verbal and tactile cues, and was given a paper hand out to continue exercise at home. Pt was educated on and agreed to plan of care. Person educated: Patient Education method: Explanation, Demonstration, Tactile cues, Verbal cues, and Handouts Education comprehension: verbalized understanding and returned demonstration   HOME EXERCISE PROGRAM: Piriformis stretch and half kneeling hip flexor stretch   ASSESSMENT:  CLINICAL IMPRESSION: PT continued therex progression for increased lateral hip stability and strengthening; decreasing hamstring demand from last session. Pt is able to comply with all cuing for proper technique of therex  with good effort throughout session. Pt with no increased pain througout session. PT will continue progression as able.     OBJECTIVE IMPAIRMENTS Abnormal gait, decreased activity tolerance, decreased balance, decreased cognition, decreased coordination, decreased endurance, decreased mobility, difficulty walking, decreased strength, increased fascial restrictions, impaired perceived functional ability, increased muscle spasms, impaired flexibility, impaired UE functional use, improper body mechanics, postural dysfunction, and pain.   ACTIVITY LIMITATIONS lifting, bending, sitting, squatting, sleeping, and dressing  PARTICIPATION LIMITATIONS: cleaning, laundry, shopping, and community activity  PERSONAL FACTORS Fitness, Past/current experiences, Profession, Time since onset of injury/illness/exacerbation, and 3+ comorbidities: anxiety, HTN, obesity  are also affecting patient's functional outcome.   REHAB POTENTIAL: Good  CLINICAL DECISION MAKING: Evolving/moderate complexity  EVALUATION COMPLEXITY: Moderate   GOALS: Goals reviewed with patient? Yes  SHORT TERM GOALS: Target date: 06/13/2022  Pt will be independent with HEP in order to improve strength and balance in order to decrease fall risk and improve function at home and work.  Baseline: HEP given  Goal status: INITIAL   LONG TERM GOALS: Target date: 07/11/2022  Patient will increase FOTO score to 83 to demonstrate predicted increase in functional mobility to complete ADLs Baseline: 55 Goal status: INITIAL  2.  Pt will decrease worst pain as reported on NPRS by at least 3 points in order to demonstrate clinically significant reduction in pain.  Baseline: 5/10 Goal status: INITIAL  3.  Pt will demonstrate RLE leg press of 76.5# or more in order to demonstrate 90% strength of unaffected LE needed for heavy ADLs Baseline: 70# Goal status: INITIAL  4.  Pt will demonstrate gross R hip MMT of  5/5 to demonstrate PLOF  needed for heavy ADLs Baseline: see eval Goal status: INITIAL     PLAN: PT FREQUENCY: 1-2x/week  PT DURATION: 8 weeks  PLANNED INTERVENTIONS: Therapeutic exercises, Therapeutic activity, Neuromuscular re-education, Balance training, Gait training, Patient/Family education, Self Care, Joint mobilization, Joint manipulation, Stair training, Dry Needling, Electrical stimulation, Spinal manipulation, Spinal mobilization, Cryotherapy, Moist heat, Traction, Ultrasound, Manual therapy, and Re-evaluation.  PLAN FOR NEXT SESSION: possible TPDN  Durwin Reges DPT  Durwin Reges, PT 05/16/2022, 3:14 PM

## 2022-05-21 ENCOUNTER — Ambulatory Visit: Payer: BC Managed Care – PPO | Admitting: Physical Therapy

## 2022-05-21 ENCOUNTER — Encounter: Payer: Self-pay | Admitting: Physical Therapy

## 2022-05-21 DIAGNOSIS — M25551 Pain in right hip: Secondary | ICD-10-CM | POA: Diagnosis not present

## 2022-05-21 NOTE — Therapy (Signed)
OUTPATIENT PHYSICAL THERAPY THORACOLUMBAR TREATMENT   Patient Name: Dana Livingston MRN: [id number] DOB:12-03-1970, 51 y.o., female Today's Date: 05/21/2022   PT End of Session - 05/21/22 1512     Visit Number 9    Number of Visits 17    Date for PT Re-Evaluation 07/05/22    Authorization - Visit Number 9    Authorization - Number of Visits 10    Progress Note Due on Visit 10    PT Start Time 1510    PT Stop Time 0175    PT Time Calculation (min) 38 min    Activity Tolerance Patient tolerated treatment well    Behavior During Therapy [facility] for tasks assessed/performed                  Past Medical History:  Diagnosis Date   Anxiety    Complication of anesthesia 1989   for wisdom teeth extracted in office, could not get her under enough so had to do at hospital   Fibroids    Hypertension    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 2001   normal pap smears since 2002   Panic attacks    Past Surgical History:  Procedure Laterality Date   [place] Left 02/14/2015   Procedure: ROBOTIC ASSISTED DRAINAGE LEFT OVARIAN CYST; FULAGRATION ON PERITONEAL ENDOMETRIOSIS;  Surgeon: [name], MD;  Location: [facility];  Service: Gynecology;  Laterality: Left;   ROBOTIC ASSISTED SALPINGO OOPHERECTOMY Right 02/14/2015   Procedure: ROBOTIC ASSISTED RIGHT SALPINGO OOPHORECTOMY;  Surgeon: [name], MD;  Location: [facility];  Service: Gynecology;  Laterality: Right;   TONSILLECTOMY  1981   WISDOM TOOTH EXTRACTION     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 No pain other than this since last session other than some UE soreness.   PERTINENT HISTORY:  Pt is a 51 year old female presenting with R sided hip pain, referral for LBP with sciatica. Pt reports pain began NYE with insidious onset, and has gotten much better. Pt reports pain is more of a cramping location on the lateral hip when she pulls her leg toward her. Reports she has previously had sacral pain but that has improved. After initial injury she has been completing exercises such as deadbug and cat/cow, and changing her mattress which has greatly help. Denies n/t down post RLE, reports she does have some medial thigh numbness when she lays on her L side. Pt describes pain as "nerve injury" repeatedly, using word descriptors of MSK nature (cramp, ache, tension). Current and best pain 1/10; worst 5/10. Pain is exacerbated by leaning on R hip, crossing leg to don/doff shoes/ paint toe nails. Pt works full time as a Programmer, systems, with a good ergonomic set up, reports no pain/cramping with this with changing position to sit. Pt enjoys hiking, and gardening.  Pt denies N/V, B&B changes, unexplained weight fluctuation, saddle paresthesia, fever, night sweats, or unrelenting night pain at this time.   PAIN:  Are you having pain? Yes: NPRS scale: 1/10 Pain location: lateral hip Pain description: cramp, tension  Aggravating factors:  leaning on RLE, pulling R hip up to don/doff shoes (hip flex) Relieving factors: straightening my leg back out   PRECAUTIONS: None  WEIGHT BEARING RESTRICTIONS No  FALLS:  Has patient fallen in last 6 months? 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  PLOF: Independent  PATIENT GOALS To improve my ROM to be able to paint my toe nails   OBJECTIVE:   DIAGNOSTIC FINDINGS:  Xray previously patient reports MD reported "not able to discern if my nerve injury is due to disc or muscle strain"  PATIENT SURVEYS:  FOTO 55 goal of 11  SCREENING FOR RED FLAGS: Bowel or bladder incontinence: No Spinal tumors: No Cauda equina syndrome: No Compression fracture: No Abdominal aneurysm: No  COGNITION:  Overall cognitive status: Within functional limits for tasks assessed     SENSATION: WFL  MUSCLE LENGTH: Hamstrings: Right  WNLdeg; Left WNL deg Thomas test: Right WNL deg; Left WNL with pain   POSTURE: decreased lumbar lordosis and weight shift left  PALPATION: TTP with concordant pain sign to greater trochanter TTP along ITB to distal insertion Tension, trigger points, and pain to superior glute max, glute med and TFL Minimal tenderness to deep palpation of hip flexor/adductor groups  LUMBAR ROM:   Active  A/PROM  eval  Flexion WNL  Extension WNL  Right lateral flexion WNL  Left lateral flexion WNL  Right rotation WNL  Left rotation WNL   (Blank rows = not tested)  LOWER EXTREMITY ROM:     All BLE motions WNL with concordant pain to FADIR position  LOWER EXTREMITY MMT:    MMT Right eval Left eval  Hip flexion 4+ 5  Hip extension 4 5  Hip abduction 4+ 5  Hip adduction    Hip internal rotation 5 5  Hip external rotation 5 5  Knee flexion 5 5  Knee extension 5 5  Ankle  dorsiflexion    Ankle plantarflexion    Ankle inversion    Ankle eversion     1RM leg press R: 70# L 85# SL STS R:11  L: 11  (Blank rows = not tested)  LUMBAR SPECIAL TESTS:  Straight leg raise test: Negative, Slump test: Negative, and FABER test: Negative  FUNCTIONAL TESTS:  5 times sit to stand: 9sec 10 meter walk test: 1.87m GAIT: Distance walked: 221mAssistive device utilized: None Level of assistance: Complete Independence Comments: slight decrease in R stance time; slight R trunk rotation and lateral flex maintained throughout gait    TODAY'S TREATMENT  Nustep seat 7 UE 9 L2 55m58mfor gentle rotation and strengthening R lateral step down 2x 10 from 6in step, good carry over of initial demo Squat on bosu (hardside) 3x 10 with min cuing to prevent knee valugs/hip IR with success Monster walk 2x [id number] and bwd GTB at knees with good carry over of demo Hooklying sit hip openner side to side 2x12 cuing to stay as upright as possible  Single leg RDL 2x 10 with excellent carry over of demo   Piriformis stretch 30sec hold seated  Glute stretch 30sec hold seatd   PATIENT EDUCATION:  Education details: Patient was educated on diagnosis, anatomy and pathology involved, prognosis, role of PT, and was given an HEP, demonstrating exercise with proper form following verbal and tactile cues, and was given a paper hand out to continue exercise at home. Pt was educated on and agreed to plan of care. Person educated: Patient Education method: Explanation, Demonstration, Tactile cues, Verbal cues, and Handouts Education comprehension: verbalized understanding and returned demonstration   HOME EXERCISE PROGRAM: Piriformis stretch and half kneeling hip flexor stretch   ASSESSMENT:  CLINICAL IMPRESSION: PT continued therex progression for increased lateral hip stability and strengthening with success. Patient is able to comply with all cuing for proper technique of therex with  excellent carry over. Patient demonstrates increased lateral stability and good use of hip strategy for therex with static balance demand. Patient with good effort and no increased pain throughout session . PT will continue progression as able.     OBJECTIVE IMPAIRMENTS Abnormal gait, decreased activity tolerance, decreased balance, decreased cognition, decreased coordination, decreased endurance, decreased mobility, difficulty walking, decreased strength, increased fascial restrictions, impaired perceived functional ability, increased muscle spasms, impaired flexibility, impaired UE functional use, improper body mechanics, postural dysfunction, and pain.   ACTIVITY LIMITATIONS lifting, bending, sitting, squatting, sleeping, and dressing  PARTICIPATION LIMITATIONS: cleaning, laundry, shopping, and community activity  PERSONAL FACTORS Fitness, Past/current experiences, Profession, Time since onset of injury/illness/exacerbation, and 3+ comorbidities: anxiety, HTN, obesity  are also affecting patient's functional outcome.   REHAB POTENTIAL: Good  CLINICAL DECISION MAKING: Evolving/moderate complexity  EVALUATION COMPLEXITY: Moderate   GOALS: Goals reviewed with patient? Yes  SHORT TERM GOALS: Target date: 06/18/2022  Pt will be independent with HEP in order to improve strength and balance in order to decrease fall risk and improve function at home and work.  Baseline: HEP given  Goal status: INITIAL   LONG TERM GOALS: Target date: 07/16/2022  Patient will increase FOTO score to 83 to demonstrate predicted increase in functional mobility to complete ADLs Baseline: 55 Goal status: INITIAL  2.  Pt will decrease worst pain as reported on NPRS by at least 3 points in order to demonstrate clinically significant reduction in pain.  Baseline: 5/10 Goal status: INITIAL  3.  Pt will demonstrate RLE leg  press of 76.5# or more in order to demonstrate 90% strength of unaffected LE needed for  heavy ADLs Baseline: 70# Goal status: INITIAL  4.  Pt will demonstrate gross R hip MMT of 5/5 to demonstrate PLOF needed for heavy ADLs Baseline: see eval Goal status: INITIAL     PLAN: PT FREQUENCY: 1-2x/week  PT DURATION: 8 weeks  PLANNED INTERVENTIONS: Therapeutic exercises, Therapeutic activity, Neuromuscular re-education, Balance training, Gait training, Patient/Family education, Self Care, Joint mobilization, Joint manipulation, Stair training, Dry Needling, Electrical stimulation, Spinal manipulation, Spinal mobilization, Cryotherapy, Moist heat, Traction, Ultrasound, Manual therapy, and Re-evaluation.  PLAN FOR NEXT SESSION: possible TPDN  Durwin Reges DPT  Durwin Reges, PT 05/21/2022, 3:50 PM

## 2022-05-23 ENCOUNTER — Encounter: Payer: Self-pay | Admitting: Physical Therapy

## 2022-05-23 ENCOUNTER — Ambulatory Visit: Payer: BC Managed Care – PPO | Admitting: Physical Therapy

## 2022-05-23 DIAGNOSIS — M25551 Pain in right hip: Secondary | ICD-10-CM | POA: Diagnosis not present

## 2022-05-23 NOTE — Therapy (Signed)
OUTPATIENT PHYSICAL THERAPY THORACOLUMBAR TREATMENT   Patient Name: Dana Livingston MRN: [id number] DOB:1970/12/13, 51 y.o., female Today's Date: 05/23/2022   PT End of Session - 05/23/22 1515     Visit Number 10    Number of Visits 17    Date for PT Re-Evaluation 07/05/22    Authorization - Visit Number 10    Authorization - Number of Visits 10    Progress Note Due on Visit 10    PT Start Time 4142    PT Stop Time 1552    PT Time Calculation (min) 41 min    Activity Tolerance Patient tolerated treatment well    Behavior During Therapy [facility] for tasks assessed/performed                   Past Medical History:  Diagnosis Date   Anxiety    Complication of anesthesia 1989   for wisdom teeth extracted in office, could not get her under enough so had to do at hospital   Fibroids    Hypertension    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 2001   normal pap smears since 2002   Panic attacks    Past Surgical History:  Procedure Laterality Date   [place] Left 02/14/2015   Procedure: ROBOTIC ASSISTED DRAINAGE LEFT OVARIAN CYST; FULAGRATION ON PERITONEAL ENDOMETRIOSIS;  Surgeon: [name], MD;  Location: [facility];  Service: Gynecology;  Laterality: Left;   ROBOTIC ASSISTED SALPINGO OOPHERECTOMY Right 02/14/2015   Procedure: ROBOTIC ASSISTED RIGHT SALPINGO OOPHORECTOMY;  Surgeon: [name], MD;  Location: [facility];  Service: Gynecology;  Laterality: Right;   TONSILLECTOMY  1981   WISDOM TOOTH EXTRACTION     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 Feels fine now. Completing HEP. Would like adductor stretch  PERTINENT HISTORY:  Pt is a 51 year old female presenting with R sided hip pain, referral for LBP with sciatica. Pt reports pain began NYE with insidious onset, and has gotten much better. Pt reports pain is more of a cramping location on the lateral hip when she pulls her leg toward her. Reports she has previously had sacral pain but that has improved. After initial injury she has been completing exercises such as deadbug and cat/cow, and changing her mattress which has greatly help. Denies n/t down post RLE, reports she does have some medial thigh numbness when she lays on her L side. Pt describes pain as "nerve injury" repeatedly, using word descriptors of MSK nature (cramp, ache, tension). Current and best pain 1/10; worst 5/10. Pain is exacerbated by leaning on R hip, crossing leg to don/doff shoes/ paint toe nails. Pt works full time as a Programmer, systems, with a good ergonomic set up, reports no pain/cramping with this with changing position to sit. Pt enjoys hiking, and gardening.  Pt denies N/V, B&B changes, unexplained weight fluctuation, saddle paresthesia, fever, night sweats, or unrelenting night pain at this time.   PAIN:  Are you having pain? Yes: NPRS scale: 1/10 Pain location: lateral hip Pain description: cramp, tension  Aggravating factors: leaning on RLE,  pulling R hip up to don/doff shoes (hip flex) Relieving factors: straightening my leg back out   PRECAUTIONS: None  WEIGHT BEARING RESTRICTIONS No  FALLS:  Has patient fallen in last 6 months? 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  PLOF: Independent  PATIENT GOALS To improve my ROM to be able to paint my toe nails   OBJECTIVE:   DIAGNOSTIC FINDINGS:  Xray previously patient reports MD reported "not able to discern if my nerve injury is due to disc or muscle strain"  PATIENT SURVEYS:  FOTO 55 goal of 78  SCREENING FOR RED FLAGS: Bowel or bladder incontinence: No Spinal tumors: No Cauda equina syndrome: No Compression fracture: No Abdominal aneurysm: No  COGNITION:  Overall cognitive status: Within functional limits for tasks assessed     SENSATION: WFL  MUSCLE LENGTH: Hamstrings: Right  WNLdeg; Left WNL deg Thomas test: Right WNL deg; Left WNL with pain   POSTURE: decreased lumbar lordosis and weight shift left  PALPATION: TTP with concordant pain sign to greater trochanter TTP along ITB to distal insertion Tension, trigger points, and pain to superior glute max, glute med and TFL Minimal tenderness to deep palpation of hip flexor/adductor groups  LUMBAR ROM:   Active  A/PROM  eval  Flexion WNL  Extension WNL  Right lateral flexion WNL  Left lateral flexion WNL  Right rotation WNL  Left rotation WNL   (Blank rows = not tested)  LOWER EXTREMITY ROM:     All BLE motions WNL with concordant pain to FADIR position  LOWER EXTREMITY MMT:    MMT Right eval Left eval  Hip flexion 4+ 5  Hip extension 4 5  Hip abduction 4+ 5  Hip adduction    Hip internal rotation 5 5  Hip external rotation 5 5  Knee flexion 5 5  Knee extension 5 5  Ankle dorsiflexion  Ankle  plantarflexion    Ankle inversion    Ankle eversion     1RM leg press R: 70# L 85# SL STS R:11  L: 11  (Blank rows = not tested)  LUMBAR SPECIAL TESTS:  Straight leg raise test: Negative, Slump test: Negative, and FABER test: Negative  FUNCTIONAL TESTS:  5 times sit to stand: 9sec 10 meter walk test: 1.57m GAIT: Distance walked: 270mAssistive device utilized: None Level of assistance: Complete Independence Comments: slight decrease in R stance time; slight R trunk rotation and lateral flex maintained throughout gait    TODAY'S TREATMENT  Nustep seat 7 UE 9 L2 70m83mfor gentle rotation and strengthening  PT reviewed the following HEP with patient with patient able to demonstrate a set of the following with min cuing for correction needed. PT educated patient on parameters of therex (how/when to inc/decrease intensity, frequency, rep/set range, stretch hold time, and purpose of therex) with verbalized understanding.   Access Code: [email or web address] - Seated Figure 4 Piriformis Stretch  - 1-3 x daily - 7 x weekly - 30-60sec hold - Supine Hip Adductor Stretch  - 1-3 x daily - 7 x weekly - 30-60sec hold - Single Leg Lunge with Foot on Bench  - 1 x daily - 1 x weekly - 3 sets - 8-12 reps - Single Leg RDL  - 1 x daily - 1 x weekly - 3 sets - 8-12 reps - Side Bridge on Balance Disk  - 1 x daily - 1 x weekly - 3 sets - 6-12 reps - Standing Hip Hiking  - 1 x daily - 1 x weekly - 3 sets - 8-12 reps  Piriformis stretch 30sec hold seated  Glute stretch 30sec hold seatd   PATIENT EDUCATION:  Education details: Patient was educated on diagnosis, anatomy and pathology involved, prognosis, role of PT, and was given an HEP, demonstrating exercise with proper form following verbal and tactile cues, and was given a paper hand out to continue exercise at home. Pt was educated on and agreed to plan of care. Person educated: Patient Education method: Explanation, Demonstration, Tactile cues, Verbal cues,  and Handouts Education comprehension: verbalized understanding and returned demonstration   HOME EXERCISE PROGRAM: Piriformis stretch and half kneeling hip flexor stretch   ASSESSMENT:  CLINICAL IMPRESSION: PT reassessed goals this session where patient has met or made progress toward all goals. Pt with some remaining deficit in lateral hip strengtheningPatient is able to demonstrate and verbalize understanding of all HEP recommendations with minimal corrections needed. Pt given clinic contact info should further questions or concerns arise. Pt to d/c PT.      OBJECTIVE IMPAIRMENTS Abnormal gait, decreased activity tolerance, decreased balance, decreased cognition, decreased coordination, decreased endurance, decreased mobility, difficulty walking, decreased strength, increased fascial restrictions, impaired perceived functional ability, increased muscle spasms, impaired flexibility, impaired UE functional use, improper body mechanics, postural dysfunction, and pain.   ACTIVITY LIMITATIONS lifting, bending, sitting, squatting, sleeping, and dressing  PARTICIPATION LIMITATIONS: cleaning, laundry, shopping, and community activity  PERSONAL FACTORS Fitness, Past/current experiences, Profession, Time since onset of injury/illness/exacerbation, and 3+ comorbidities: anxiety, HTN, obesity  are also affecting patient's functional outcome.   REHAB POTENTIAL: Good  CLINICAL DECISION MAKING: Evolving/moderate complexity  EVALUATION COMPLEXITY: Moderate   GOALS: Goals reviewed with patient? Yes  SHORT TERM GOALS: Target date: 06/20/2022  Pt will be independent with HEP in order to improve strength and balance in order to decrease fall risk and improve function at home  and work.  Baseline: HEP given  Goal status: INITIAL   LONG TERM GOALS: Target date: 07/18/2022  Patient will increase FOTO score to 83 to demonstrate predicted increase in functional mobility to complete ADLs Baseline:  55; 05/23/22 83 Goal status: MET  2.  Pt will decrease worst pain as reported on NPRS by at least 3 points in order to demonstrate clinically significant reduction in pain.  Baseline: 5/10 05/23/22 3/10 Goal status: ONGOING  3.  Pt will demonstrate RLE leg press of 76.5# or more in order to demonstrate 90% strength of unaffected LE needed for heavy ADLs Baseline: 70# 05/23/22 77# Goal status: MET  4.  Pt will demonstrate gross R hip MMT of 5/5 to demonstrate PLOF needed for heavy ADLs Baseline: see eval; R hip ext and abd: 4+/5 Goal status: ongoing     PLAN: PT FREQUENCY: 1-2x/week  PT DURATION: 8 weeks  PLANNED INTERVENTIONS: Therapeutic exercises, Therapeutic activity, Neuromuscular re-education, Balance training, Gait training, Patient/Family education, Self Care, Joint mobilization, Joint manipulation, Stair training, Dry Needling, Electrical stimulation, Spinal manipulation, Spinal mobilization, Cryotherapy, Moist heat, Traction, Ultrasound, Manual therapy, and Re-evaluation.  PLAN FOR NEXT SESSION: possible TPDN  Durwin Reges DPT  Durwin Reges, PT 05/23/2022, 4:07 PM

## 2022-05-28 ENCOUNTER — Encounter: Payer: Self-pay | Admitting: Physical Therapy

## 2022-05-28 ENCOUNTER — Ambulatory Visit: Payer: BC Managed Care – PPO | Admitting: Physical Therapy

## 2022-05-28 DIAGNOSIS — M25551 Pain in right hip: Secondary | ICD-10-CM

## 2022-05-28 NOTE — Therapy (Signed)
OUTPATIENT PHYSICAL THERAPY THORACOLUMBAR TREATMENT   Patient Name: Dana Livingston MRN: [id number] DOB:April 06, 1971, 51 y.o., female Today's Date: 05/31/2022   PT End of Session - 05/31/22 0910     Visit Number 11    Number of Visits 17    Date for PT Re-Evaluation 07/05/22    Authorization - Visit Number 1    Authorization - Number of Visits 10    Progress Note Due on Visit 10    PT Start Time 5750    PT Stop Time 1550    PT Time Calculation (min) 38 min    Activity Tolerance Patient tolerated treatment well    Behavior During Therapy [facility] for tasks assessed/performed               Past Medical History:  Diagnosis Date   Anxiety    Complication of anesthesia 1989   for wisdom teeth extracted in office, could not get her under enough so had to do at hospital   Fibroids    Hypertension    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 2001   normal pap smears since 2002   Panic attacks    Past Surgical History:  Procedure Laterality Date   [place] Left 02/14/2015   Procedure: ROBOTIC ASSISTED DRAINAGE LEFT OVARIAN CYST; FULAGRATION ON PERITONEAL ENDOMETRIOSIS;  Surgeon: [name], MD;  Location: [facility];  Service: Gynecology;  Laterality: Left;   ROBOTIC ASSISTED SALPINGO OOPHERECTOMY Right 02/14/2015   Procedure: ROBOTIC ASSISTED RIGHT SALPINGO OOPHORECTOMY;  Surgeon: [name], MD;  Location: [facility];  Service: Gynecology;  Laterality: Right;   TONSILLECTOMY  1981   WISDOM TOOTH EXTRACTION     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 Had a vertigo episode over the weekend but it has been better since. Has not had a chance to complete strength training HEP but the stretching is going well.   PERTINENT HISTORY:  Pt is a 51 year old female presenting with R sided hip pain, referral for LBP with sciatica. Pt reports pain began NYE with insidious onset, and has gotten much better. Pt reports pain is more of a cramping location on the lateral hip when she pulls her leg toward her. Reports she has previously had sacral pain but that has improved. After initial injury she has been completing exercises such as deadbug and cat/cow, and changing her mattress which has greatly help. Denies n/t down post RLE, reports she does have some medial thigh numbness when she lays on her L side. Pt describes pain as "nerve injury" repeatedly, using word descriptors of MSK nature (cramp, ache, tension). Current and best pain 1/10; worst 5/10. Pain is exacerbated by leaning on R hip, crossing leg to don/doff shoes/ paint toe nails. Pt works full time as a Programmer, systems, with a good ergonomic set up, reports no pain/cramping with this with changing position to sit. Pt enjoys hiking, and gardening.  Pt denies N/V, B&B changes, unexplained weight fluctuation, saddle paresthesia, fever, night sweats, or unrelenting night pain at this time.   PAIN:  Are you having pain?  Yes: NPRS scale: 1/10 Pain location: lateral hip Pain description: cramp, tension  Aggravating factors: leaning on RLE, pulling R hip up to don/doff shoes (hip flex) Relieving factors: straightening my leg back out   PRECAUTIONS: None  WEIGHT BEARING RESTRICTIONS No  FALLS:  Has patient fallen in last 6 months? 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  PLOF: Independent  PATIENT GOALS To improve my ROM to be able to paint my toe nails   OBJECTIVE:   DIAGNOSTIC FINDINGS:  Xray previously patient reports MD reported "not able to discern if my nerve injury is due to disc or muscle strain"  PATIENT SURVEYS:  FOTO 55 goal of 10  SCREENING FOR RED FLAGS: Bowel or bladder incontinence: No Spinal tumors: No Cauda equina syndrome: No Compression fracture: No Abdominal aneurysm: No  COGNITION:  Overall cognitive status: Within functional limits for tasks assessed     SENSATION: WFL  MUSCLE LENGTH: Hamstrings: Right  WNLdeg; Left WNL deg Thomas test: Right WNL deg; Left WNL with pain   POSTURE: decreased lumbar lordosis and weight shift left  PALPATION: TTP with concordant pain sign to greater trochanter TTP along ITB to distal insertion Tension, trigger points, and pain to superior glute max, glute med and TFL Minimal tenderness to deep palpation of hip flexor/adductor groups  LUMBAR ROM:   Active  A/PROM  eval  Flexion WNL  Extension WNL  Right lateral flexion WNL  Left lateral flexion WNL  Right rotation WNL  Left rotation WNL   (Blank rows = not tested)  LOWER EXTREMITY ROM:     All BLE motions WNL with concordant pain to FADIR position  LOWER EXTREMITY MMT:    MMT Right eval Left eval  Hip flexion 4+ 5  Hip extension 4 5  Hip abduction 4+ 5  Hip adduction    Hip  internal rotation  5 5  Hip external rotation 5 5  Knee flexion 5 5  Knee extension 5 5  Ankle dorsiflexion    Ankle plantarflexion    Ankle inversion    Ankle eversion     1RM leg press R: 70# L 85# SL STS R:11  L: 11  (Blank rows = not tested)  LUMBAR SPECIAL TESTS:  Straight leg raise test: Negative, Slump test: Negative, and FABER test: Negative  FUNCTIONAL TESTS:  5 times sit to stand: 9sec 10 meter walk test: 1.71m GAIT: Distance walked: 26mAssistive device utilized: None Level of assistance: Complete Independence Comments: slight decrease in R stance time; slight R trunk rotation and lateral flex maintained throughout gait    TODAY'S TREATMENT  Nustep seat 7 UE 9 L2 28m42mfor gentle rotation and strengthening [place] squat x10 bilat with good symmetry bilat; w/10# DB x10 bilat; min cuing for set up with good carry over Single leg deadlift 10# DB 2x 10 with good carry over of initial demo  Hip hike x12 bilat; with 10# in [place] min cuing needed for activation on hiking hip with good carry over  Side bridge from feet 2x 7/6 bilat   Piriformis stretch 30sec hold seated  Glute stretch 30sec hold seatd     PATIENT EDUCATION:  Education details: Patient was educated on diagnosis, anatomy and pathology involved, prognosis, role of PT, and was given an HEP, demonstrating exercise with proper form following verbal and tactile cues, and was given a paper hand out to continue exercise at home. Pt was educated on and agreed to plan of care. Person educated: Patient Education method: Explanation, Demonstration, Tactile cues, Verbal cues, and Handouts Education comprehension: verbalized understanding and returned demonstration   HOME EXERCISE PROGRAM: Piriformis stretch and half kneeling hip flexor stretch   ASSESSMENT:  CLINICAL IMPRESSION: PT continued therex progression for hip and core stability and strengthening with lateral focus with success.  Pt is able to comply with all cuing for proper technique with excellent effort throughout session. Patient demonstrates good understanding of HEP exercises to be completed in week absence from PT. Would benefit from skilled PT to address above deficits and promote optimal return to PLOF.      OBJECTIVE IMPAIRMENTS Abnormal gait, decreased activity tolerance, decreased balance, decreased cognition, decreased coordination, decreased endurance, decreased mobility, difficulty walking, decreased strength, increased fascial restrictions, impaired perceived functional ability, increased muscle spasms, impaired flexibility, impaired UE functional use, improper body mechanics, postural dysfunction, and pain.   ACTIVITY LIMITATIONS lifting, bending, sitting, squatting, sleeping, and dressing  PARTICIPATION LIMITATIONS: cleaning, laundry, shopping, and community activity  PERSONAL FACTORS Fitness, Past/current experiences, Profession, Time since onset of injury/illness/exacerbation, and 3+ comorbidities: anxiety, HTN, obesity  are also affecting patient's functional outcome.   REHAB POTENTIAL: Good  CLINICAL DECISION MAKING: Evolving/moderate complexity  EVALUATION COMPLEXITY: Moderate   GOALS: Goals reviewed with patient? Yes  SHORT TERM GOALS: Target date: 06/28/2022  Pt will be independent with HEP in order to improve strength and balance in order to decrease fall risk and improve function at home and work.  Baseline: HEP given  Goal status: INITIAL   LONG TERM GOALS: Target date: 07/26/2022  Patient will increase FOTO score to 83 to demonstrate predicted increase in functional mobility to complete ADLs Baseline: 55; 05/23/22 83 Goal status: MET  2.  Pt will decrease worst pain as reported on NPRS by at least 3 points in order to demonstrate clinically significant reduction in  pain.  Baseline: 5/10 05/23/22 3/10 Goal status: ONGOING  3.  Pt will demonstrate RLE leg press of 76.5# or  more in order to demonstrate 90% strength of unaffected LE needed for heavy ADLs Baseline: 70# 05/23/22 77# Goal status: MET  4.  Pt will demonstrate gross R hip MMT of 5/5 to demonstrate PLOF needed for heavy ADLs Baseline: see eval; R hip ext and abd: 4+/5 Goal status: ongoing     PLAN: PT FREQUENCY: 1-2x/week  PT DURATION: 8 weeks  PLANNED INTERVENTIONS: Therapeutic exercises, Therapeutic activity, Neuromuscular re-education, Balance training, Gait training, Patient/Family education, Self Care, Joint mobilization, Joint manipulation, Stair training, Dry Needling, Electrical stimulation, Spinal manipulation, Spinal mobilization, Cryotherapy, Moist heat, Traction, Ultrasound, Manual therapy, and Re-evaluation.  PLAN FOR NEXT SESSION: possible TPDN  Durwin Reges DPT  Durwin Reges, PT 05/31/2022, 9:11 AM

## 2022-05-30 ENCOUNTER — Encounter: Payer: BC Managed Care – PPO | Admitting: Physical Therapy

## 2022-05-30 DIAGNOSIS — F4310 Post-traumatic stress disorder, unspecified: Secondary | ICD-10-CM | POA: Diagnosis not present

## 2022-06-10 ENCOUNTER — Ambulatory Visit: Payer: BC Managed Care – PPO | Admitting: Physical Therapy

## 2022-06-10 ENCOUNTER — Encounter: Payer: Self-pay | Admitting: Physical Therapy

## 2022-06-10 DIAGNOSIS — M25551 Pain in right hip: Secondary | ICD-10-CM

## 2022-06-10 NOTE — Therapy (Signed)
OUTPATIENT PHYSICAL THERAPY THORACOLUMBAR TREATMENT/Discharge Summary   Patient Name: Dana Livingston MRN: [id number] DOB:21-Nov-1970, 51 y.o., female Today's Date: 06/10/2022   PT End of Session - 06/10/22 1514     Visit Number 12    Number of Visits 17    Date for PT Re-Evaluation 07/05/22    Authorization - Visit Number 12    Authorization - Number of Visits 10    Progress Note Due on Visit 20    PT Start Time 5885    PT Stop Time 1540    PT Time Calculation (min) 25 min    Activity Tolerance Patient tolerated treatment well    Behavior During Therapy [facility] for tasks assessed/performed                Past Medical History:  Diagnosis Date   Anxiety    Complication of anesthesia 1989   for wisdom teeth extracted in office, could not get her under enough so had to do at hospital   Fibroids    Hypertension    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 2001   normal pap smears since 2002   Panic attacks    Past Surgical History:  Procedure Laterality Date   [place] Left 02/14/2015   Procedure: ROBOTIC ASSISTED DRAINAGE LEFT OVARIAN CYST; FULAGRATION ON PERITONEAL ENDOMETRIOSIS;  Surgeon: [name], MD;  Location: [facility];  Service: Gynecology;  Laterality: Left;   ROBOTIC ASSISTED SALPINGO OOPHERECTOMY Right 02/14/2015   Procedure: ROBOTIC ASSISTED RIGHT SALPINGO OOPHORECTOMY;  Surgeon: [name], MD;  Location: [facility];  Service: Gynecology;  Laterality: Right;   TONSILLECTOMY  1981   WISDOM TOOTH EXTRACTION     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   Obesity 03/26/2017   Hypertension    Anxiety    Panic attacks    Endometrioma of ovary  01/18/2015   Elevated cancer antigen 125 (CA-125) 01/18/2015   Fibroids 01/18/2015    PCP: Lavon Paganini MD  REFERRING PROVIDER: Rory Percy DO  REFERRING DIAG: LBP  Rationale for Evaluation and Treatment Rehabilitation  THERAPY DIAG:  Pain in right hip  ONSET DATE: Jan 2023  SUBJECTIVE:  SUBJECTIVE STATEMENT: Pt reports feeling pretty good, LB was a little cranky earlier today following working overtime this weekend. Had a vertigo episode over the weekend but it has been better since. Has not had a chance to complete strength training HEP but the stretching is going well.   PERTINENT HISTORY:  Pt is a 51 year old female presenting with R sided hip pain, referral for LBP with sciatica. Pt reports pain began NYE with insidious onset, and has gotten much better. Pt reports pain is more of a cramping location on the lateral hip when she pulls her leg toward her. Reports she has previously had sacral pain but that has improved. After initial injury she has been completing exercises such as deadbug and cat/cow, and changing her mattress which has greatly help. Denies n/t down post RLE, reports she does have some medial thigh numbness when she lays on her L side. Pt describes pain as "nerve injury" repeatedly, using word descriptors of MSK nature (cramp, ache, tension). Current and best pain 1/10; worst 5/10. Pain is exacerbated by leaning on R hip, crossing leg to don/doff shoes/ paint toe nails. Pt works full time as a Programmer, systems, with a good ergonomic set up, reports no pain/cramping with this with changing position to sit. Pt enjoys hiking, and gardening.  Pt denies N/V, B&B changes, unexplained weight fluctuation, saddle paresthesia, fever, night sweats, or unrelenting night pain at this time.   PAIN:   Are you having pain? Yes: NPRS scale: 1/10 Pain location: lateral hip Pain description: cramp, tension  Aggravating factors: leaning on RLE, pulling R hip up to don/doff shoes (hip flex) Relieving factors: straightening my leg back out   PRECAUTIONS: None  WEIGHT BEARING RESTRICTIONS No  FALLS:  Has patient fallen in last 6 months? 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  PLOF: Independent  PATIENT GOALS To improve my ROM to be able to paint my toe nails   OBJECTIVE:   DIAGNOSTIC FINDINGS:  Xray previously patient reports MD reported "not able to discern if my nerve injury is due to disc or muscle strain"  PATIENT SURVEYS:  FOTO 55 goal of 39  SCREENING FOR RED FLAGS: Bowel or bladder incontinence: No Spinal tumors: No Cauda equina syndrome: No Compression fracture: No Abdominal aneurysm: No  COGNITION:  Overall cognitive status: Within functional limits for tasks assessed     SENSATION: WFL  MUSCLE LENGTH: Hamstrings: Right  WNLdeg; Left WNL deg Thomas test: Right WNL deg; Left WNL with pain   POSTURE: decreased lumbar lordosis and weight shift left  PALPATION: TTP with concordant pain sign to greater trochanter TTP along ITB to distal insertion Tension, trigger points, and pain to superior glute max, glute med and TFL Minimal tenderness to deep palpation of hip flexor/adductor groups  LUMBAR ROM:   Active  A/PROM  eval  Flexion WNL  Extension WNL  Right lateral flexion WNL  Left lateral flexion WNL  Right rotation WNL  Left rotation WNL   (Blank rows = not tested)  LOWER EXTREMITY ROM:     All BLE motions WNL with concordant pain to FADIR position  LOWER EXTREMITY MMT:    MMT Right eval Left eval  Hip flexion 4+ 5  Hip extension 4 5  Hip abduction 4+ 5   Hip adduction    Hip internal rotation  5 5  Hip external rotation 5 5  Knee flexion 5 5  Knee extension 5 5  Ankle dorsiflexion    Ankle plantarflexion    Ankle inversion    Ankle eversion     1RM leg press R: 70# L 85# SL STS R:11  L: 11  (Blank rows = not tested)  LUMBAR SPECIAL TESTS:  Straight leg raise test: Negative, Slump test: Negative, and FABER test: Negative  FUNCTIONAL TESTS:  5 times sit to stand: 9sec 10 meter walk test: 1.72m GAIT: Distance walked: 240mAssistive device utilized: None Level of assistance: Complete Independence Comments: slight decrease in R stance time; slight R trunk rotation and lateral flex maintained throughout gait    TODAY'S TREATMENT  Nustep seat 7 UE 9 L2 50m750mfor gentle rotation and strengthening; simultaneous pain science education  PT reviewed the following HEP with patient with patient able to demonstrate a set of the following with min cuing for correction needed. PT educated patient on parameters of therex (how/when to inc/decrease intensity, frequency, rep/set range, stretch hold time, and purpose of therex) with verbalized understanding.  [place] squat w/10# DB x10 bilat with cuing for lower without excessive forward tibial translation Single leg deadlift 10# DB x10 with education on contralateral wt hold for balance Hip hike with 10# x10 with min cuing for technqieu Side bridge from feet x6 each side  Piriformis stretch 30sec hold seated  Glute stretch 30sec hold seatd      PATIENT EDUCATION:  Education details: Patient was educated on diagnosis, anatomy and pathology involved, prognosis, role of PT, and was given an HEP, demonstrating exercise with proper form following verbal and tactile cues, and was given a paper hand out to continue exercise at home. Pt was educated on and agreed to plan of care. Person educated: Patient Education method: Explanation, Demonstration, Tactile cues, Verbal cues, and  Handouts Education comprehension: verbalized understanding and returned demonstration   HOME EXERCISE PROGRAM: Piriformis stretch and half kneeling hip flexor stretch   ASSESSMENT:  CLINICAL IMPRESSION: PT reassessed goals this session where patient has met all goals to safely d/c formal PT. Patient is able to demonstrate and verbalize understanding of all HEP recommendations with minimal corrections needed. Pt given clinic contact info should further questions or concerns arise. Pt to d/c PT.        OBJECTIVE IMPAIRMENTS Abnormal gait, decreased activity tolerance, decreased balance, decreased cognition, decreased coordination, decreased endurance, decreased mobility, difficulty walking, decreased strength, increased fascial restrictions, impaired perceived functional ability, increased muscle spasms, impaired flexibility, impaired UE functional use, improper body mechanics, postural dysfunction, and pain.   ACTIVITY LIMITATIONS lifting, bending, sitting, squatting, sleeping, and dressing  PARTICIPATION LIMITATIONS: cleaning, laundry, shopping, and community activity  PERSONAL FACTORS Fitness, Past/current experiences, Profession, Time since onset of injury/illness/exacerbation, and 3+ comorbidities: anxiety, HTN, obesity  are also affecting patient's functional outcome.   REHAB POTENTIAL: Good  CLINICAL DECISION MAKING: Evolving/moderate complexity  EVALUATION COMPLEXITY: Moderate   GOALS: Goals reviewed with patient? Yes  SHORT TERM GOALS: Target date: 07/08/2022  Pt will be independent with HEP in order to improve strength and balance in order to decrease fall risk and improve function at home and work.  Baseline: HEP given  Goal status: INITIAL   LONG TERM GOALS: Target date: 08/05/2022  Patient will increase FOTO score to 83 to demonstrate predicted increase in functional mobility to complete ADLs Baseline: 55; 05/23/22 83 Goal status: MET  2.  Pt will decrease  worst pain as reported on NPRS by at least 3 points in order to demonstrate clinically significant reduction in pain.  Baseline: 5/10 05/23/22 3/10 06/10/22 no pain, just muscle soreness Goal status: MET  3.  Pt will demonstrate RLE leg press of 76.5# or more in order to demonstrate 90% strength of unaffected LE needed for heavy ADLs Baseline: 70# 05/23/22 77# Goal status: MET  4.  Pt will demonstrate gross R hip MMT of 5/5 to demonstrate PLOF needed for heavy ADLs Baseline: see eval; R hip ext and abd: 4+/5 06/10/22 5/5 gross strength Goal status: MET     PLAN: PT FREQUENCY: 1-2x/week  PT DURATION: 8 weeks  PLANNED INTERVENTIONS: Therapeutic exercises, Therapeutic activity, Neuromuscular re-education, Balance training, Gait training, Patient/Family education, Self Care, Joint mobilization, Joint manipulation, Stair training, Dry Needling, Electrical stimulation, Spinal manipulation, Spinal mobilization, Cryotherapy, Moist heat, Traction, Ultrasound, Manual therapy, and Re-evaluation.  PLAN FOR NEXT SESSION: possible TPDN  Durwin Reges DPT  Durwin Reges, PT 06/10/2022, 3:41 PM

## 2022-06-13 DIAGNOSIS — F4310 Post-traumatic stress disorder, unspecified: Secondary | ICD-10-CM | POA: Diagnosis not present

## 2022-06-20 ENCOUNTER — Encounter: Payer: BC Managed Care – PPO | Admitting: Physical Therapy

## 2022-06-24 ENCOUNTER — Encounter: Payer: BC Managed Care – PPO | Admitting: Physical Therapy

## 2022-07-02 ENCOUNTER — Encounter: Payer: BC Managed Care – PPO | Admitting: Physical Therapy

## 2022-07-09 ENCOUNTER — Other Ambulatory Visit: Payer: Self-pay | Admitting: Family Medicine

## 2022-07-10 ENCOUNTER — Encounter: Payer: BC Managed Care – PPO | Admitting: Physical Therapy

## 2022-07-11 NOTE — Telephone Encounter (Signed)
Requested Prescriptions  Pending Prescriptions Disp Refills   amLODipine (NORVASC) 5 MG tablet [Pharmacy Med Name: AMLODIPINE BESYLATE '5MG'$  TABLETS] 90 tablet 0    Sig: TAKE 1 TABLET(5 MG) BY MOUTH DAILY     Cardiovascular: Calcium Channel Blockers 2 Failed - 07/09/2022  7:17 AM      Failed - Last BP in normal range    BP Readings from Last 1 Encounters:  04/21/22 (!) 181/89         Passed - Last Heart Rate in normal range    Pulse Readings from Last 1 Encounters:  04/21/22 60         Passed - Valid encounter within last 6 months    Recent Outpatient Visits           4 months ago Annual physical exam   [facility] Myles Gip, DO   10 months ago Primary hypertension   [facility] [place], Dionne Bucy, MD   1 year ago Encounter for annual physical exam   [facility] [place], Dionne Bucy, MD   1 year ago Chronic cough   [place], Avanti, MD   1 year ago Primary hypertension   [facility] [place], Dionne Bucy, MD       Future Appointments             In 1 month Bacigalupo, Dionne Bucy, MD [facility] [place]   In 2 months Brendolyn Patty, MD [facility]

## 2022-07-16 ENCOUNTER — Encounter: Payer: BC Managed Care – PPO | Admitting: Physical Therapy

## 2022-07-23 ENCOUNTER — Encounter: Payer: BC Managed Care – PPO | Admitting: Physical Therapy

## 2022-07-30 ENCOUNTER — Encounter: Payer: BC Managed Care – PPO | Admitting: Physical Therapy

## 2022-08-06 ENCOUNTER — Encounter: Payer: BC Managed Care – PPO | Admitting: Physical Therapy

## 2022-08-13 ENCOUNTER — Encounter: Payer: BC Managed Care – PPO | Admitting: Physical Therapy

## 2022-08-22 ENCOUNTER — Encounter: Payer: Self-pay | Admitting: Family Medicine

## 2022-08-22 ENCOUNTER — Ambulatory Visit: Payer: 59 | Admitting: Family Medicine

## 2022-08-22 VITALS — BP 139/79 | HR 71 | Temp 97.6°F | Resp 20 | Ht 64.0 in | Wt 196.2 lb

## 2022-08-22 DIAGNOSIS — F419 Anxiety disorder, unspecified: Secondary | ICD-10-CM

## 2022-08-22 DIAGNOSIS — E66811 Obesity, class 1: Secondary | ICD-10-CM

## 2022-08-22 DIAGNOSIS — E559 Vitamin D deficiency, unspecified: Secondary | ICD-10-CM

## 2022-08-22 DIAGNOSIS — Z6833 Body mass index (BMI) 33.0-33.9, adult: Secondary | ICD-10-CM

## 2022-08-22 DIAGNOSIS — F410 Panic disorder [episodic paroxysmal anxiety] without agoraphobia: Secondary | ICD-10-CM

## 2022-08-22 DIAGNOSIS — I10 Essential (primary) hypertension: Secondary | ICD-10-CM | POA: Diagnosis not present

## 2022-08-22 DIAGNOSIS — E669 Obesity, unspecified: Secondary | ICD-10-CM

## 2022-08-22 DIAGNOSIS — E538 Deficiency of other specified B group vitamins: Secondary | ICD-10-CM

## 2022-08-22 DIAGNOSIS — R739 Hyperglycemia, unspecified: Secondary | ICD-10-CM

## 2022-08-22 MED ORDER — ALPRAZOLAM 0.25 MG PO TABS: 0.2500 mg | ORAL_TABLET | Freq: Every day | ORAL | 2 refills | Status: DC | PRN

## 2022-08-22 MED ORDER — AMLODIPINE BESYLATE 5 MG PO TABS: 5.0000 mg | ORAL_TABLET | Freq: Every day | ORAL | 3 refills | Status: DC

## 2022-08-22 NOTE — Assessment & Plan Note (Signed)
Well controlled Continue current medications Recheck metabolic panel F/u in 6 months  

## 2022-08-22 NOTE — Progress Notes (Signed)
I,Sulibeya S Dimas,acting as a Education administrator for Lavon Paganini, MD.,have documented all relevant documentation on the behalf of Lavon Paganini, MD,as directed by  Lavon Paganini, MD while in the presence of Lavon Paganini, MD.     Established patient visit   Patient: Dana Livingston   DOB: 1971-01-25   52 y.o. Female  MRN: [id number] Visit Date: 08/22/2022  Today's healthcare provider: Lavon Paganini, MD   Chief Complaint  Patient presents with   Hypertension   Subjective    Hypertension, follow-up  BP Readings from Last 3 Encounters:  08/22/22 139/79  04/21/22 (!) 181/89  02/20/22 (!) 144/83   Wt Readings from Last 3 Encounters:  08/22/22 196 lb 3.2 oz (89 kg)  04/21/22 184 lb (83.5 kg)  02/20/22 184 lb (83.5 kg)     She was last seen for hypertension 6 months ago.  BP at that visit was 181/89. Management since that visit includes no changes.  She reports excellent compliance with treatment. She is not having side effects.    Outside blood pressures are 137/84, 140/86, 135/83, 135/85, 139/92, 134/84.  Pertinent labs Lab Results  Component Value Date   CHOL 148 02/05/2022   HDL 51 02/05/2022   LDLCALC 85 02/05/2022   TRIG 57 02/05/2022   Lab Results  Component Value Date   NA 139 04/21/2022   K 4.4 04/21/2022   CREATININE 0.67 04/21/2022   GFRNONAA >60 04/21/2022   GLUCOSE 160 (H) 04/21/2022     The 10-year ASCVD risk score (Arnett DK, et al., 2019) is: 2%* (Cholesterol units were assumed)  --------------------------------------------------------------------------------------------------- While brother was awaiting sentencing - he committed suicide. No longer in therapy. In a good headspace currently  Declines mammogram  Medications: Outpatient Medications Prior to Visit  Medication Sig   Barberry-Oreg Grape-Goldenseal (BERBERINE COMPLEX PO) Take by mouth. 1200 mg   cetirizine (ZYRTEC) 10 MG tablet Take 1 tablet (10 mg total) by mouth  daily.   Chaste Tree (VITEX EXTRACT PO) Take 2 capsules by mouth daily.    Cholecalciferol (VITAMIN D) 2000 units CAPS Take 1 capsule by mouth daily.    Cyanocobalamin (B-12 SL) Place under the tongue.    doxylamine, Sleep, (UNISOM) 25 MG tablet Take 25 mg by mouth at bedtime as needed.   FYAVOLV 0.5-2.5 MG-MCG tablet TAKE 1 TABLET BY MOUTH EVERY DAY 3 1/2 WEEKS ON 4 DAYS OFF   Melatonin 2.5 MG CHEW Chew by mouth.   Multiple Vitamin (MULTIVITAMIN) capsule Take 1 capsule by mouth daily.    Omega-3 Fatty Acids (OMEGA 3 500) 500 MG CAPS Take 525 mg by mouth. VEGAN   PASSION FLOWER-VALERIAN PO Take by mouth.    Sodium Hyaluronate, oral, (HYALURONIC ACID) 100 MG CAPS Take by mouth.   TURMERIC PO Take 2 tablets by mouth daily.    [DISCONTINUED] ALPRAZolam (XANAX) 0.25 MG tablet TAKE 1 TABLET(0.25 MG) BY MOUTH DAILY AS NEEDED FOR ANXIETY   [DISCONTINUED] amLODipine (NORVASC) 5 MG tablet TAKE 1 TABLET(5 MG) BY MOUTH DAILY   [DISCONTINUED] 5-Hydroxytryptophan (5-HTP) 200 MG TABS Take by mouth. (Patient not taking: Reported on 08/22/2022)   [DISCONTINUED] fluticasone (FLONASE) 50 MCG/ACT nasal spray Place 1 spray into both nostrils 2 (two) times daily. (Patient not taking: Reported on 08/22/2022)   [DISCONTINUED] GAMMA AMINOBUTYRIC ACID PO Take 200 mg by mouth. (Patient not taking: Reported on 08/22/2022)   No facility-administered medications prior to visit.    Review of Systems  Constitutional:  Negative for chills, fever and unexpected weight change.  Eyes:  Negative for visual disturbance.  Respiratory:  Negative for chest tightness and shortness of breath.   Cardiovascular:  Positive for leg swelling. Negative for chest pain and palpitations.  Neurological:  Negative for dizziness, light-headedness and headaches.       Objective    BP 139/79 (BP Location: Left Arm, Patient Position: Sitting, Cuff Size: Large)   Pulse 71   Temp 97.6 F (36.4 C) (Temporal)   Resp 20   Ht 5' 4"$  (1.626 m)   Wt  196 lb 3.2 oz (89 kg)   SpO2 97%   BMI 33.68 kg/m    Physical Exam Vitals reviewed.  Constitutional:      General: She is not in acute distress.    Appearance: Normal appearance. She is well-developed. She is not diaphoretic.  HENT:     Head: Normocephalic and atraumatic.  Eyes:     General: No scleral icterus.    Conjunctiva/sclera: Conjunctivae normal.  Neck:     Thyroid: No thyromegaly.  Cardiovascular:     Rate and Rhythm: Normal rate and regular rhythm.     Pulses: Normal pulses.     Heart sounds: Normal heart sounds. No murmur heard. Pulmonary:     Effort: Pulmonary effort is normal. No respiratory distress.     Breath sounds: Normal breath sounds. No wheezing, rhonchi or rales.  Musculoskeletal:     Cervical back: Neck supple.     Right lower leg: No edema.     Left lower leg: No edema.  Lymphadenopathy:     Cervical: No cervical adenopathy.  Skin:    General: Skin is warm and dry.     Findings: No rash.  Neurological:     Mental Status: She is alert and oriented to person, place, and time. Mental status is at baseline.  Psychiatric:        Mood and Affect: Mood normal.        Behavior: Behavior normal.       No results found for any visits on 08/22/22.  Assessment & Plan     Problem List Items Addressed This Visit       Cardiovascular and Mediastinum   Hypertension - Primary    Well controlled Continue current medications Recheck metabolic panel F/u in 6 months       Relevant Medications   amLODipine (NORVASC) 5 MG tablet   Other Relevant Orders   Basic Metabolic Panel (BMET)     Other   Anxiety    Chronic and stable Not currently in therapy Continue xanax prn for anxiety attacks Patient not interested in SSRIs at this time      Relevant Medications   ALPRAZolam (XANAX) 0.25 MG tablet   Panic attacks    Minimal symptoms and only rarely using Xanax Refill given      Relevant Medications   ALPRAZolam (XANAX) 0.25 MG tablet    Obesity    Discussed importance of healthy weight management Discussed diet and exercise       Vitamin D deficiency    Continue supplement Recheck level       Relevant Orders   VITAMIN D 25 Hydroxy (Vit-D Deficiency, Fractures)   Vitamin B12 deficiency    Continue supplement Recheck level       Relevant Orders   B12   Other Visit Diagnoses     Hyperglycemia       Relevant Orders   Hemoglobin A1c        Return in about  6 months (around 02/20/2023) for CPE.      I, Lavon Paganini, MD, have reviewed all documentation for this visit. The documentation on 08/23/22 for the exam, diagnosis, procedures, and orders are all accurate and complete.   Ethelbert Thain, Dionne Bucy, MD, MPH [facility]

## 2022-08-23 NOTE — Assessment & Plan Note (Signed)
Chronic and stable Not currently in therapy Continue xanax prn for anxiety attacks Patient not interested in SSRIs at this time

## 2022-08-23 NOTE — Assessment & Plan Note (Signed)
Minimal symptoms and only rarely using Xanax Refill given

## 2022-08-23 NOTE — Assessment & Plan Note (Signed)
Continue supplement Recheck level 

## 2022-08-23 NOTE — Assessment & Plan Note (Signed)
Discussed importance of healthy weight management Discussed diet and exercise  

## 2022-08-28 LAB — BASIC METABOLIC PANEL
BUN/Creatinine Ratio: 14 (ref 9–23)
BUN: 10 mg/dL (ref 6–24)
CO2: 23 mmol/L (ref 20–29)
Calcium: 9.1 mg/dL (ref 8.7–10.2)
Chloride: 100 mmol/L (ref 96–106)
Creatinine, Ser: 0.7 mg/dL (ref 0.57–1.00)
Glucose: 87 mg/dL (ref 70–99)
Potassium: 4.4 mmol/L (ref 3.5–5.2)
Sodium: 137 mmol/L (ref 134–144)
eGFR: 103 mL/min/{1.73_m2} (ref >59)

## 2022-08-28 LAB — HEMOGLOBIN A1C
Est. average glucose Bld gHb Est-mCnc: 108 mg/dL
Hgb A1c MFr Bld: 5.4 % (ref 4.8–5.6)

## 2022-08-28 LAB — VITAMIN B12: Vitamin B-12: 727 pg/mL (ref 232–1245)

## 2022-08-28 LAB — VITAMIN D 25 HYDROXY (VIT D DEFICIENCY, FRACTURES): Vit D, 25-Hydroxy: 30.7 ng/mL (ref 30.0–100.0)

## 2022-09-23 ENCOUNTER — Other Ambulatory Visit: Payer: Self-pay | Admitting: Obstetrics and Gynecology

## 2022-09-23 DIAGNOSIS — N951 Menopausal and female climacteric states: Secondary | ICD-10-CM

## 2022-09-26 ENCOUNTER — Other Ambulatory Visit: Payer: Self-pay

## 2022-09-26 ENCOUNTER — Other Ambulatory Visit: Payer: Self-pay | Admitting: Obstetrics and Gynecology

## 2022-09-26 DIAGNOSIS — N951 Menopausal and female climacteric states: Secondary | ICD-10-CM

## 2022-09-26 MED ORDER — NORETHINDRONE-ETH ESTRADIOL 0.5-2.5 MG-MCG PO TABS: ORAL_TABLET | ORAL | 0 refills | Status: DC

## 2022-09-26 NOTE — Telephone Encounter (Signed)
Pt calling for refill of Fyavolv; appt 4/18th.  [phone]  Pt aware refill eRx'd.

## 2022-10-01 ENCOUNTER — Ambulatory Visit: Payer: 59 | Admitting: Dermatology

## 2022-10-01 VITALS — BP 152/92 | HR 72

## 2022-10-01 DIAGNOSIS — L719 Rosacea, unspecified: Secondary | ICD-10-CM

## 2022-10-01 DIAGNOSIS — L905 Scar conditions and fibrosis of skin: Secondary | ICD-10-CM | POA: Diagnosis not present

## 2022-10-01 DIAGNOSIS — L817 Pigmented purpuric dermatosis: Secondary | ICD-10-CM | POA: Diagnosis not present

## 2022-10-01 DIAGNOSIS — L814 Other melanin hyperpigmentation: Secondary | ICD-10-CM

## 2022-10-01 DIAGNOSIS — Z1283 Encounter for screening for malignant neoplasm of skin: Secondary | ICD-10-CM

## 2022-10-01 DIAGNOSIS — L821 Other seborrheic keratosis: Secondary | ICD-10-CM

## 2022-10-01 DIAGNOSIS — Z8614 Personal history of Methicillin resistant Staphylococcus aureus infection: Secondary | ICD-10-CM

## 2022-10-01 DIAGNOSIS — L578 Other skin changes due to chronic exposure to nonionizing radiation: Secondary | ICD-10-CM

## 2022-10-01 DIAGNOSIS — D1801 Hemangioma of skin and subcutaneous tissue: Secondary | ICD-10-CM

## 2022-10-01 DIAGNOSIS — L732 Hidradenitis suppurativa: Secondary | ICD-10-CM

## 2022-10-01 DIAGNOSIS — D229 Melanocytic nevi, unspecified: Secondary | ICD-10-CM

## 2022-10-01 DIAGNOSIS — Z808 Family history of malignant neoplasm of other organs or systems: Secondary | ICD-10-CM

## 2022-10-01 MED ORDER — IVERMECTIN 1 % EX CREA: TOPICAL_CREAM | CUTANEOUS | 5 refills | Status: DC

## 2022-10-01 NOTE — Progress Notes (Signed)
New Patient Visit  Subjective  Dana Livingston is a 52 y.o. female who presents for the following: Annual Exam (Fhx of [place] in grandfather, no personal hx of dysplastic nevi or skin cancers, patient has a list of concerns with her and brought her skin care products with her today). The patient presents for Total-Body Skin Exam (TBSE) for skin cancer screening and mole check.  The patient has spots, moles and lesions to be evaluated, some may be new or changing.   The following portions of the chart were reviewed this encounter and updated as appropriate:       Review of Systems:  No other skin or systemic complaints except as noted in HPI or Assessment and Plan.  Objective  Well appearing patient in no apparent distress; mood and affect are within normal limits.  A full examination was performed including scalp, head, eyes, ears, nose, lips, neck, chest, axillae, abdomen, back, buttocks, bilateral upper extremities, bilateral lower extremities, hands, feet, fingers, toes, fingernails, and toenails. All findings within normal limits unless otherwise noted below.  Face Mid face erythema with telangiectasias +/- scattered inflammatory papules.   Feet Orange/tan hyperpigmented macules of the feet.   Abdomen x 2 Dyspigmented smooth macule or patch.   Axilla, groin Violaceous scars in the inguinal crease.    Assessment & Plan  Rosacea Face  Chronic and persistent condition with duration or expected duration over one year. Condition is bothersome/symptomatic for patient. Currently flared.   Rosacea is a chronic progressive skin condition usually affecting the face of adults, causing redness and/or acne bumps. It is treatable but not curable. It sometimes affects the eyes (ocular rosacea) as well. It may respond to topical and/or systemic medication and can flare with stress, sun exposure, alcohol, exercise, topical steroids (including hydrocortisone/cortisone 10) and some foods.   Daily application of broad spectrum spf 30+ sunscreen to face is recommended to reduce flares.   Patient has tried and failed topical Metronidazole   Patient defers systemic treatment today.   Continue hypochlorous Spray (found in the wound care section) OR Cln brand Acne or Sports wash. The [facility] Hypochlorous Spray can be sprayed on daily and left on. The Cln wash should be applied to the affected area daily for at least 30 seconds and then rinsed off. If you are using clindamycin solution or lotion or another topical antibiotic to treat acne, using a hypochlorous product may help lower the risk of antibiotic resistant bacteria.    Start Soolantra cream QHS. If not covered or expensive will send to [facility] for better pricing.  Use a sunscreen spf 30+ everyday.  Ivermectin (SOOLANTRA) 1 % CREA - Face Apply to the face QHS.  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 Condition is symptomatic/ bothersome to patient.  Reassured patient that condition is benign and due to chronic edema. Discussed topical steroids but pt defers at this time. Continue compression stockings daily. If wearing sandals in the summer recommend sunscreen as sun exposure will darken areas.  Scars Abdomen x 2  Due to previous surgeries.  Benign.  Observation.  Call clinic for new or changing lesions.  Recommend daily use of broad spectrum spf 30+ sunscreen to sun-exposed areas.    Hidradenitis suppurativa Axilla, groin  Chronic condition with duration or expected duration over one year. Currently well-controlled.   Hidradenitis Suppurativa is a chronic; persistent; non-curable, but treatable condition due to abnormal inflamed sweat glands in the  body folds (axilla, inframammary, groin, medial thighs), causing recurrent painful draining cysts and scarring. It can be associated with severe scarring acne and cysts; also abscesses and  scarring of scalp. The goal is control and prevention of flares, as it is not curable. Scars are permanent and can be thickened. Treatment may include daily use of topical medication and oral antibiotics.  Oral isotretinoin may also be helpful.  For some cases, Humira or Cosentyx (biologic injections) may be prescribed to decrease the inflammatory process and prevent flares.  When indicated, inflamed cysts may also be treated surgically.  Pt states that well controlled with OTC PO tumeric. No tx needed.   Recommend [facility] Hypochlorous Spray (found in the wound care section) OR Cln brand Acne or Sports wash. The [facility] Hypochlorous Spray can be sprayed on daily and left on. The Cln wash should be applied to the affected area daily for at least 30 seconds and then rinsed off. If you are using clindamycin solution or lotion or another topical antibiotic to treat acne, using a hypochlorous product may help lower the risk of antibiotic resistant bacteria.    Recommend [facility] Hypochlorous Spray (found in the wound care section) OR Cln brand Acne or Sports wash. The [facility] Hypochlorous Spray can be sprayed on daily and left on. The Cln wash should be applied to the affected area daily for at least 30 seconds and then rinsed off. If you are using clindamycin solution or lotion or another topical antibiotic to treat acne, using a hypochlorous product may help lower the risk of antibiotic resistant bacteria.     Lentigines - Scattered tan macules - Due to sun exposure - Benign-appearing, observe - Recommend daily broad spectrum sunscreen SPF 30+ to sun-exposed areas, reapply every 2 hours as needed. - Call for any changes  Seborrheic Keratoses - Stuck-on, waxy, tan-brown papules and/or plaques  - Benign-appearing - Discussed benign etiology and prognosis. - Observe - Call for any changes  Melanocytic Nevi - Tan-brown and/or pink-flesh-colored symmetric macules and papules - Benign  appearing on exam today - Observation - Call clinic for new or changing moles - Recommend daily use of broad spectrum spf 30+ sunscreen to sun-exposed areas.   Hemangiomas - Red papules - Discussed benign nature - Observe - Call for any changes  Actinic Damage - Chronic condition, secondary to cumulative UV/sun exposure - diffuse scaly erythematous macules with underlying dyspigmentation - Recommend daily broad spectrum sunscreen SPF 30+ to sun-exposed areas, reapply every 2 hours as needed.  - Staying in the shade or wearing long sleeves, sun glasses (UVA+UVB protection) and wide brim hats (4-inch brim around the entire circumference of the hat) are also recommended for sun protection.  - Call for new or changing lesions.  Family history of skin cancer - what type(s): basal cell carcinoma - who affected: grandfather  History of psoriasis as a child, currently under control. No tx needed.   Skin cancer screening performed today.  Return in about 1 year (around 10/01/2023) for TBSE, 3 mths for rosacea follow up .  Luther Redo, CMA, am acting as scribe for Brendolyn Patty, MD .  Documentation: I have reviewed the above documentation for accuracy and completeness, and I agree with the above.  Brendolyn Patty MD

## 2022-10-01 NOTE — Patient Instructions (Addendum)
Rosacea is a chronic progressive skin condition usually affecting the face of adults, causing redness and/or acne bumps. It is treatable but not curable. It sometimes affects the eyes (ocular rosacea) as well. It may respond to topical and/or systemic medication and can flare with stress, sun exposure, alcohol, exercise, topical steroids (including hydrocortisone/cortisone 10) and some foods.  Daily application of broad spectrum spf 30+ sunscreen to face is recommended to reduce flares.  Rosacea  What is rosacea? Rosacea (say: ro-zay-sha) is a common skin disease that usually begins as a trend of flushing or blushing easily.  As rosacea progresses, a persistent redness in the center of the face will develop and may gradually spread beyond the nose and cheeks to the forehead and chin.  In some cases, the ears, chest, and back could be affected.  Rosacea may appear as tiny blood vessels or small red bumps that occur in crops.  Frequently they can contain pus, and are called "pustules".  If the bumps do not contain pus, they are referred to as "papules".  Rarely, in prolonged, untreated cases of rosacea, the oil glands of the nose and cheeks may become permanently enlarged.  This is called rhinophyma, and is seen more frequently in men.  Signs and Risks In its beginning stages, rosacea tends to come and go, which makes it difficult to recognize.  It can start as intermittent flushing of the face.  Eventually, blood vessels may become permanently visible.  Pustules and papules can appear, but can be mistaken for adult acne.  People of all races, ages, genders and ethnic groups are at risk of developing rosacea.  However, it is more common in women (especially around menopause) and adults with fair skin between the ages of 28 and 21.  Treatment Dermatologists typically recommend a combination of treatments to effectively manage rosacea.  Treatment can improve symptoms and may stop the progression of the  rosacea.  Treatment may involve both topical and oral medications.  The tetracycline antibiotics are often used for their anti-inflammatory effect; however, because of the possibility of developing antibiotic resistance, they should not be used long term at full dose.  For dilated blood vessels the options include electrodessication (uses electric current through a small needle), laser treatment, and cosmetics to hide the redness.   With all forms of treatment, improvement is a slow process, and patients may not see any results for the first 3-4 weeks.  It is very important to avoid the sun and other triggers.  Patients must wear sunscreen daily.  Skin Care Instructions: Cleanse the skin with a mild soap such as CeraVe cleanser, Cetaphil cleanser, or Dove soap once or twice daily as needed. Moisturize with Eucerin Redness Relief Daily Perfecting Lotion (has a subtle green tint), CeraVe Moisturizing Cream, or Oil of Olay Daily Moisturizer with sunscreen every morning and/or night as recommended. Makeup should be "non-comedogenic" (won't clog pores) and be labeled "for sensitive skin". Good choices for cosmetics are: Neutrogena, Almay, and Physician's Formula.  Any product with a green tint tends to offset a red complexion. If your eyes are dry and irritated, use artificial tears 2-3 times per day and cleanse the eyelids daily with baby shampoo.  Have your eyes examined at least every 2 years.  Be sure to tell your eye doctor that you have rosacea. Alcoholic beverages tend to cause flushing of the skin, and may make rosacea worse. Always wear sunscreen, protect your skin from extreme hot and cold temperatures, and avoid spicy foods,  hot drinks, and mechanical irritation such as rubbing, scrubbing, or massaging the face.  Avoid harsh skin cleansers, cleansing masks, astringents, and exfoliation. If a particular product burns or makes your face feel tight, then it is likely to flare your rosacea. If you are  having difficulty finding a sunscreen that you can tolerate, you may try switching to a chemical-free sunscreen.  These are ones whose active ingredient is zinc oxide or titanium dioxide only.  They should also be fragrance free, non-comedogenic, and labeled for sensitive skin. Rosacea triggers may vary from person to person.  There are a variety of foods that have been reported to trigger rosacea.  Some patients find that keeping a diary of what they were doing when they flared helps them avoid triggers.   Gentle Skin Care Guide  1. Bathe no more than once a day.  2. Avoid bathing in hot water  3. Use a mild soap like Dove, Vanicream, Cetaphil, CeraVe. Can use Lever 2000 or Cetaphil antibacterial soap  4. Use soap only where you need it. On most days, use it under your arms, between your legs, and on your feet. Let the water rinse other areas unless visibly dirty.  5. When you get out of the bath/shower, use a towel to gently blot your skin dry, don't rub it.  6. While your skin is still a little damp, apply a moisturizing cream such as Vanicream, CeraVe, Cetaphil, Eucerin, Sarna lotion or plain Vaseline Jelly. For hands apply Neutrogena [place] Hand Cream or Excipial Hand Cream.  7. Reapply moisturizer any time you start to itch or feel dry.  8. Sometimes using free and clear laundry detergents can be helpful. Fabric softener sheets should be avoided. Downy Free & Gentle liquid, or any liquid fabric softener that is free of dyes and perfumes, it acceptable to use  9. If your doctor has given you prescription creams you may apply moisturizers over them     Due to recent changes in healthcare laws, you may see results of your pathology and/or laboratory studies on MyChart before the doctors have had a chance to review them. We understand that in some cases there may be results that are confusing or concerning to you. Please understand that not all results are received at the same time and  often the doctors may need to interpret multiple results in order to provide you with the best plan of care or course of treatment. Therefore, we ask that you please give [place] 2 business days to thoroughly review all your results before contacting the office for clarification. Should we see a critical lab result, you will be contacted sooner.   If You Need Anything After Your Visit  If you have any questions or concerns for your doctor, please call our main line at [phone] and press option 4 to reach your doctor's medical assistant. If no one answers, please leave a voicemail as directed and we will return your call as soon as possible. Messages left after 4 pm will be answered the following business day.   You may also send [place] a message via [place]. We typically respond to MyChart messages within 1-2 business days.  For prescription refills, please ask your pharmacy to contact our office. Our fax number is [phone].  If you have an urgent issue when the clinic is closed that cannot wait until the next business day, you can page your doctor at the number below.    Please note that while we do  our best to be available for urgent issues outside of office hours, we are not available 24/7.   If you have an urgent issue and are unable to reach [place], you may choose to seek medical care at your doctor's office, retail clinic, urgent care center, or emergency room.  If you have a medical emergency, please immediately call 911 or go to the emergency department.  Pager Numbers  - Dr. Nehemiah Massed: [phone]  - Dr. Laurence Ferrari: [phone]  - Dr. Nicole Kindred: [phone]  In the event of inclement weather, please call our main line at [phone] for an update on the status of any delays or closures.  Dermatology Medication Tips: Please keep the boxes that topical medications come in in order to help keep track of the instructions about where and how to use these. Pharmacies typically print the  medication instructions only on the boxes and not directly on the medication tubes.   If your medication is too expensive, please contact our office at [phone] option 4 or send [place] a message through [place].   We are unable to tell what your co-pay for medications will be in advance as this is different depending on your insurance coverage. However, we may be able to find a substitute medication at lower cost or fill out paperwork to get insurance to cover a needed medication.   If a prior authorization is required to get your medication covered by your insurance company, please allow [place] 1-2 business days to complete this process.  Drug prices often vary depending on where the prescription is filled and some pharmacies may offer cheaper prices.  The website www.goodrx.com contains coupons for medications through different pharmacies. The prices here do not account for what the cost may be with help from insurance (it may be cheaper with your insurance), but the website can give you the price if you did not use any insurance.  - You can print the associated coupon and take it with your prescription to the pharmacy.  - You may also stop by our office during regular business hours and pick up a GoodRx coupon card.  - If you need your prescription sent electronically to a different pharmacy, notify our office through [facility] or by phone at [phone] option 4.     Si Usted Necesita Algo Despus de Su Visita  Tambin puede enviarnos un mensaje a travs de Pharmacist, community. Por lo general respondemos a los mensajes de MyChart en el transcurso de 1 a 2 das hbiles.  Para renovar recetas, por favor pida a su farmacia que se ponga en contacto con nuestra oficina. Harland Dingwall de fax es [place] [phone].  Si tiene un asunto urgente cuando la clnica est cerrada y que no puede esperar hasta el siguiente da hbil, puede llamar/localizar a su doctor(a) al nmero que aparece a continuacin.    Por favor, tenga en cuenta que aunque hacemos todo lo posible para estar disponibles para asuntos urgentes fuera del horario de [place], no estamos disponibles las 24 horas del da, los 7 das de la [place].   Si tiene un problema urgente y no puede comunicarse con nosotros, puede optar por buscar atencin mdica  en el consultorio de su doctor(a), en una clnica privada, en un centro de atencin urgente o en una sala de emergencias.  Si tiene Engineering geologist, por favor llame inmediatamente al 911 o vaya a la sala de emergencias.  Nmeros de bper  - Dr. Nehemiah Massed: [phone]  - Dra. Moye: [phone]  -  Eliberto IvoryEB:[id number]  En caso de inclemencias del [place], por favor llame a nuestra lnea principal al [phone] para una actualizacin sobre el [place] de cualquier retraso o cierre.  Consejos para la medicacin en dermatologa: Por favor, guarde las cajas en las que vienen los medicamentos de uso tpico para ayudarle a seguir las instrucciones sobre dnde y cmo usarlos. Las farmacias generalmente imprimen las instrucciones del medicamento slo en las cajas y no directamente en los tubos del [place].   Si su medicamento es muy caro, por favor, pngase en contacto con Zigmund Daniel llamando al [phone] y presione la opcin 4 o envenos un mensaje a travs de Pharmacist, community.   No podemos decirle cul ser su copago por los medicamentos por adelantado ya que esto es diferente dependiendo de la cobertura de su seguro. Sin embargo, es posible que podamos encontrar un medicamento sustituto a Electrical engineer un formulario para que el seguro cubra el medicamento que se considera necesario.   Si se requiere una autorizacin previa para que su compaa de seguros [place] su medicamento, por favor permtanos de 1 a 2 das hbiles para completar este proceso.  Los precios de los medicamentos varan con frecuencia dependiendo del Environmental consultant de dnde se surte la receta y alguna  farmacias pueden ofrecer precios ms baratos.  El sitio web www.goodrx.com tiene cupones para medicamentos de Airline pilot. Los precios aqu no tienen en cuenta lo que podra costar con la ayuda del seguro (puede ser ms barato con su seguro), pero el sitio web puede darle el precio si no utiliz Research scientist (physical sciences).  - Puede imprimir el cupn correspondiente y llevarlo con su receta a la farmacia.  - Tambin puede pasar por nuestra oficina durante el horario de atencin regular y Charity fundraiser una tarjeta de cupones de GoodRx.  - Si necesita que su receta se enve electrnicamente a una farmacia diferente, informe a nuestra oficina a travs de MyChart de [place] o por telfono llamando al [phone] y presione la opcin 4.

## 2022-10-10 ENCOUNTER — Encounter: Payer: Self-pay | Admitting: Dermatology

## 2022-10-16 MED ORDER — TRIAMCINOLONE ACETONIDE 0.1 % EX CREA: TOPICAL_CREAM | CUTANEOUS | 0 refills | Status: DC

## 2022-10-29 NOTE — Progress Notes (Signed)
PCP: Erasmo Downer, MD   Chief Complaint  Patient presents with   Gynecologic Exam    No concerns    HPI:      Dana Livingston is a 52 y.o. G0P0000 whose LMP was Patient's last menstrual period was 09/23/2022 (approximate)., presents today for her annual examination.  Her menses are monthly on HRT (never went a year without bleeding, started on Lo Loestrin for VS sx), lasting 5-7 days, spotting with 1 heavy day of mod flow; does have mid cycle spotting (normal for pt), mild dysmen. Period doesn't coincide with placebo pills. On Femhrt for HRT, doing cyclically. Had more BTB with Lo Loestrin last yr. Pt's vasomotor sx well controlled with Femhrt.   Sex activity: single partner. No pain/bleeding/dryness   Last Pap: 05/15/21  Results were: no abnormalities /neg HPV DNA. Pt works at [employer] and wants pap.   Last mammogram: pt has researched and declines There is no FH of breast cancer. There is no FH of ovarian cancer. The patient does do self-breast exams.  Colonoscopy: never; neg Cologuard 9/22;  Repeat due after 3 years.   Tobacco use: The patient denies current or previous tobacco use. Alcohol use: none No drug use Exercise: min active  She does get adequate calcium and Vitamin D in her diet.  Labs with PCP.   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   Elevated cancer antigen 125 (CA-125) 01/18/2015   Fibroids 01/18/2015    Past Surgical History:  Procedure Laterality Date   CHOLECYSTECTOMY  1991   LAPAROSCOPIC OVARIAN CYSTECTOMY Left 1998   LASIK  2001   ROBOTIC ASSISTED LAPAROSCOPIC OVARIAN CYSTECTOMY Left 02/14/2015   Procedure: ROBOTIC ASSISTED DRAINAGE LEFT OVARIAN CYST; FULAGRATION ON  PERITONEAL ENDOMETRIOSIS;  Surgeon: Cleda Mccreedy, MD;  Location: [facility];  Service: Gynecology;  Laterality: Left;   ROBOTIC ASSISTED SALPINGO OOPHERECTOMY Right 02/14/2015   Procedure: ROBOTIC ASSISTED RIGHT SALPINGO OOPHORECTOMY;  Surgeon: Cleda Mccreedy, MD;  Location: [facility];  Service: Gynecology;  Laterality: Right;   TONSILLECTOMY  1981   WISDOM TOOTH EXTRACTION      Family History  Problem Relation Age of Onset   Thyroid disease Mother    Hypertension Mother    Stroke Mother    Hyperlipidemia Mother        with atherosclerosis   COPD Father        former smoker   Hypertension Father    Heart failure Father        former smoker    Colon cancer Paternal Uncle    Lung cancer Maternal Grandmother    COPD Maternal Grandfather    Breast cancer Neg Hx    Ovarian cancer Neg Hx    Cervical cancer Neg Hx     Social History   Socioeconomic History   Marital status: Married    Spouse name: Jacqlyn Krauss   Number of children: 0   Years of education: masters x 2   Highest education level: Not on file  Occupational History    Employer: [employer]  Tobacco Use   Smoking status: Never   Smokeless tobacco: Never  Vaping Use   Vaping Use: Never used  Substance  and Sexual Activity   Alcohol use: No   Drug use: No   Sexual activity: Yes    Birth control/protection: Pill  Other Topics Concern   Not on file  Social History Narrative   Not on file   Social Determinants of Health   Financial Resource Strain: Not on file  Food Insecurity: Not on file  Transportation Needs: Not on file  Physical Activity: Not on file  Stress: Not on file  Social Connections: Not on file  Intimate Partner Violence: Not on file     Current Outpatient Medications:    ALPRAZolam (XANAX) 0.25 MG tablet, Take 1 tablet (0.25 mg total) by mouth daily as needed for anxiety., Disp: 30 tablet, Rfl: 2   amLODipine (NORVASC) 5 MG tablet, Take 1 tablet (5 mg total) by mouth daily., Disp: 90 tablet, Rfl: 3    cetirizine (ZYRTEC) 10 MG tablet, Take 1 tablet (10 mg total) by mouth daily., Disp: 30 tablet, Rfl: 0   Chaste Tree (VITEX EXTRACT PO), Take 2 capsules by mouth daily. , Disp: , Rfl:    Cholecalciferol (VITAMIN D) 2000 units CAPS, Take 1 capsule by mouth daily. , Disp: , Rfl:    Cyanocobalamin (B-12 SL), Place under the tongue. , Disp: , Rfl:    doxylamine, Sleep, (UNISOM) 25 MG tablet, Take 25 mg by mouth at bedtime as needed., Disp: , Rfl:    Ivermectin (SOOLANTRA) 1 % CREA, Apply to the face QHS., Disp: 45 g, Rfl: 5   Multiple Vitamin (MULTIVITAMIN) capsule, Take 1 capsule by mouth daily. , Disp: , Rfl:    Omega-3 Fatty Acids (OMEGA 3 500) 500 MG CAPS, Take 525 mg by mouth. VEGAN, Disp: , Rfl:    PASSION FLOWER-VALERIAN PO, Take by mouth. , Disp: , Rfl:    Sodium Hyaluronate, oral, (HYALURONIC ACID) 100 MG CAPS, Take by mouth., Disp: , Rfl:    triamcinolone cream (KENALOG) 0.1 %, Apply twice daily up to 4 weeks. Avoid applying to face, groin, and axilla. Use as directed. Long-term use can cause thinning of the skin., Disp: 80 g, Rfl: 0   TURMERIC PO, Take 2 tablets by mouth daily. , Disp: , Rfl:    norethindrone-ethinyl estradiol (FYAVOLV) 0.5-2.5 MG-MCG tablet, TAKE 1 TABLET BY MOUTH Daily, Disp: 84 tablet, Rfl: 3     ROS:  Review of Systems  Constitutional:  Negative for fatigue, fever and unexpected weight change.  Respiratory:  Negative for cough, shortness of breath and wheezing.   Cardiovascular:  Negative for chest pain, palpitations and leg swelling.  Gastrointestinal:  Negative for blood in stool, constipation, diarrhea, nausea and vomiting.  Endocrine: Negative for cold intolerance, heat intolerance and polyuria.  Genitourinary:  Negative for dyspareunia, dysuria, flank pain, frequency, genital sores, hematuria, menstrual problem, pelvic pain, urgency, vaginal bleeding, vaginal discharge and vaginal pain.  Musculoskeletal:  Negative for back pain, joint swelling and myalgias.   Skin:  Negative for rash.  Neurological:  Negative for dizziness, syncope, light-headedness, numbness and headaches.  Hematological:  Negative for adenopathy.  Psychiatric/Behavioral:  Negative for agitation, confusion, sleep disturbance and suicidal ideas. The patient is not nervous/anxious.    BREAST: No symptoms    Objective: BP 134/88   Ht 5\' 4"  (1.626 m)   Wt 200 lb (90.7 kg)   LMP 09/23/2022 (Approximate)   BMI 34.33 kg/m    Physical Exam Constitutional:      Appearance: She is well-developed.  Genitourinary:     Vulva normal.  Right Labia: No rash, tenderness or lesions.    Left Labia: No tenderness, lesions or rash.    No vaginal discharge, erythema or tenderness.      Right Adnexa: not tender and no mass present.    Left Adnexa: not tender and no mass present.    No cervical friability or polyp.     Uterus is not enlarged or tender.  Breasts:    Right: No mass, nipple discharge, skin change or tenderness.     Left: No mass, nipple discharge, skin change or tenderness.  Neck:     Thyroid: No thyromegaly.  Cardiovascular:     Rate and Rhythm: Normal rate and regular rhythm.     Heart sounds: Normal heart sounds. No murmur heard. Pulmonary:     Effort: Pulmonary effort is normal.     Breath sounds: Normal breath sounds.  Abdominal:     Palpations: Abdomen is soft.     Tenderness: There is no abdominal tenderness. There is no guarding or rebound.  Musculoskeletal:        General: Normal range of motion.     Cervical back: Normal range of motion.  Lymphadenopathy:     Cervical: No cervical adenopathy.  Neurological:     General: No focal deficit present.     Mental Status: She is alert and oriented to person, place, and time.     Cranial Nerves: No cranial nerve deficit.  Skin:    General: Skin is warm and dry.  Psychiatric:        Mood and Affect: Mood normal.        Behavior: Behavior normal.        Thought Content: Thought content normal.         Judgment: Judgment normal.  Vitals reviewed.    Assessment/Plan:  Encounter for annual routine gynecological examination  Cervical cancer screening - Plan: IGP, Aptima HPV  Screening for HPV (human papillomavirus) - Plan: IGP, Aptima HPV  Encounter for screening mammogram for malignant neoplasm of breast - Plan: MM 3D SCREENING MAMMOGRAM BILATERAL BREAST; encouraged pt to schedule mammo but declines.   Perimenopausal symptoms - Plan: norethindrone-ethinyl estradiol (FYAVOLV) 0.5-2.5 MG-MCG tablet; Rx RF. Sx controlled, doing well. Still has menses (never postmenopausal).   Hormone replacement therapy (HRT) - Plan: norethindrone-ethinyl estradiol (FYAVOLV) 0.5-2.5 MG-MCG tablet   Meds ordered this encounter  Medications   norethindrone-ethinyl estradiol (FYAVOLV) 0.5-2.5 MG-MCG tablet    Sig: TAKE 1 TABLET BY MOUTH Daily    Dispense:  84 tablet    Refill:  3    Order Specific Question:   Supervising Provider    Answer:   Waymon Budge            GYN counsel breast self exam, mammography screening, adequate intake of calcium and vitamin D, diet and exercise    F/U  Return in about 1 year (around 10/31/2023).  Dana Pavon B. Kaelie Henigan, PA-C 10/31/2022 5:30 PM

## 2022-10-31 ENCOUNTER — Encounter: Payer: Self-pay | Admitting: Obstetrics and Gynecology

## 2022-10-31 ENCOUNTER — Ambulatory Visit (INDEPENDENT_AMBULATORY_CARE_PROVIDER_SITE_OTHER): Payer: 59 | Admitting: Obstetrics and Gynecology

## 2022-10-31 VITALS — BP 134/88 | Ht 64.0 in | Wt 200.0 lb

## 2022-10-31 DIAGNOSIS — Z79890 Hormone replacement therapy (postmenopausal): Secondary | ICD-10-CM

## 2022-10-31 DIAGNOSIS — Z1151 Encounter for screening for human papillomavirus (HPV): Secondary | ICD-10-CM

## 2022-10-31 DIAGNOSIS — Z1231 Encounter for screening mammogram for malignant neoplasm of breast: Secondary | ICD-10-CM

## 2022-10-31 DIAGNOSIS — N951 Menopausal and female climacteric states: Secondary | ICD-10-CM

## 2022-10-31 DIAGNOSIS — Z01419 Encounter for gynecological examination (general) (routine) without abnormal findings: Secondary | ICD-10-CM | POA: Diagnosis not present

## 2022-10-31 DIAGNOSIS — Z124 Encounter for screening for malignant neoplasm of cervix: Secondary | ICD-10-CM

## 2022-10-31 MED ORDER — NORETHINDRONE-ETH ESTRADIOL 0.5-2.5 MG-MCG PO TABS: ORAL_TABLET | ORAL | 3 refills | Status: DC

## 2022-10-31 NOTE — Patient Instructions (Signed)
I value your feedback and you entrusting us with your care. If you get a [place] patient survey, I would appreciate you taking the time to let us know about your experience today. Thank you!  [facility]: [phone]  [facility]: [phone]    

## 2022-11-07 LAB — IGP, APTIMA HPV: HPV Aptima: NEGATIVE

## 2022-12-20 ENCOUNTER — Other Ambulatory Visit: Payer: Self-pay | Admitting: Obstetrics and Gynecology

## 2022-12-20 DIAGNOSIS — Z79890 Hormone replacement therapy (postmenopausal): Secondary | ICD-10-CM

## 2022-12-20 DIAGNOSIS — N951 Menopausal and female climacteric states: Secondary | ICD-10-CM

## 2022-12-31 ENCOUNTER — Ambulatory Visit: Payer: 59 | Admitting: Dermatology

## 2022-12-31 ENCOUNTER — Encounter: Payer: Self-pay | Admitting: Dermatology

## 2022-12-31 VITALS — BP 185/93 | HR 74

## 2022-12-31 DIAGNOSIS — L817 Pigmented purpuric dermatosis: Secondary | ICD-10-CM | POA: Diagnosis not present

## 2022-12-31 DIAGNOSIS — L719 Rosacea, unspecified: Secondary | ICD-10-CM | POA: Diagnosis not present

## 2022-12-31 MED ORDER — AZELAIC ACID 15 % EX GEL: CUTANEOUS | 11 refills | Status: DC

## 2022-12-31 NOTE — Patient Instructions (Addendum)
For rosacea   Continue Soolantra cream nightly to affected areas  Start Azelaic Acid 15 % gel -  After skin is thoroughly washed and patted dry, gently but thoroughly massage a thin film of azelaic acid cream into the affected area daily to twice daily, in the morning   For Schamberg's Purpura of feet  Can use as needed for inflammation. TMC 0.1% cream apply twice daily up 2 weeks then 2 weeks off  Avoid applying to face, groin, and axilla. Use as directed. Long-term use can cause thinning of the skin.  Caution skin atrophy with long-term use.   Topical steroids (such as triamcinolone, fluocinolone, fluocinonide, mometasone, clobetasol, halobetasol, betamethasone, hydrocortisone) can cause thinning and lightening of the skin if they are used for too long in the same area. Your physician has selected the right strength medicine for your problem and area affected on the body. Please use your medication only as directed by your physician to prevent side effects.        Due to recent changes in healthcare laws, you may see results of your pathology and/or laboratory studies on MyChart before the doctors have had a chance to review them. We understand that in some cases there may be results that are confusing or concerning to you. Please understand that not all results are received at the same time and often the doctors may need to interpret multiple results in order to provide you with the best plan of care or course of treatment. Therefore, we ask that you please give [place] 2 business days to thoroughly review all your results before contacting the office for clarification. Should we see a critical lab result, you will be contacted sooner.   If You Need Anything After Your Visit  If you have any questions or concerns for your doctor, please call our main line at [phone] and press option 4 to reach your doctor's medical assistant. If no one answers, please leave a voicemail as directed and we  will return your call as soon as possible. Messages left after 4 pm will be answered the following business day.   You may also send [place] a message via MyChart. We typically respond to MyChart messages within 1-2 business days.  For prescription refills, please ask your pharmacy to contact our office. Our fax number is [phone].  If you have an urgent issue when the clinic is closed that cannot wait until the next business day, you can page your doctor at the number below.    Please note that while we do our best to be available for urgent issues outside of office hours, we are not available 24/7.   If you have an urgent issue and are unable to reach [place], you may choose to seek medical care at your doctor's office, retail clinic, urgent care center, or emergency room.  If you have a medical emergency, please immediately call 911 or go to the emergency department.  Pager Numbers  - Dr. Gwen Pounds: [phone]  - Dr. Neale Burly: [phone]  - Dr. Roseanne Reno: [phone]  In the event of inclement weather, please call our main line at [phone] for an update on the status of any delays or closures.  Dermatology Medication Tips: Please keep the boxes that topical medications come in in order to help keep track of the instructions about where and how to use these. Pharmacies typically print the medication instructions only on the boxes and not directly on the medication tubes.   If your medication is  too expensive, please contact our office at [phone] option 4 or send [place] a message through MyChart.   We are unable to tell what your co-pay for medications will be in advance as this is different depending on your insurance coverage. However, we may be able to find a substitute medication at lower cost or fill out paperwork to get insurance to cover a needed medication.   If a prior authorization is required to get your medication covered by your insurance company, please allow [place] 1-2  business days to complete this process.  Drug prices often vary depending on where the prescription is filled and some pharmacies may offer cheaper prices.  The website www.goodrx.com contains coupons for medications through different pharmacies. The prices here do not account for what the cost may be with help from insurance (it may be cheaper with your insurance), but the website can give you the price if you did not use any insurance.  - You can print the associated coupon and take it with your prescription to the pharmacy.  - You may also stop by our office during regular business hours and pick up a GoodRx coupon card.  - If you need your prescription sent electronically to a different pharmacy, notify our office through [facility] or by phone at [phone] option 4.     Si Usted Necesita Algo Despus de Su Visita  Tambin puede enviarnos un mensaje a travs de Clinical cytogeneticist. Por lo general respondemos a los mensajes de MyChart en el transcurso de 1 a 2 das hbiles.  Para renovar recetas, por favor pida a su farmacia que se ponga en contacto con nuestra oficina. Annie Sable de fax es [place] [phone].  Si tiene un asunto urgente cuando la clnica est cerrada y que no puede esperar hasta el siguiente da hbil, puede llamar/localizar a su doctor(a) al nmero que aparece a continuacin.   Por favor, tenga en cuenta que aunque hacemos todo lo posible para estar disponibles para asuntos urgentes fuera del horario de [place], no estamos disponibles las 24 horas del da, los 7 [address]  [address] de la [place].   Si tiene un problema urgente y no puede comunicarse con nosotros, puede optar por buscar atencin mdica  en el consultorio de su doctor(a), en una clnica privada, en un centro de atencin urgente o en una sala de emergencias.  Si tiene Engineer, drilling, por favor llame inmediatamente al 911 o vaya a la sala de emergencias.  Nmeros de bper  - Dr. Gwen Pounds: [phone]  - Dra.  Moye: [phone]  - Dra. Roseanne Reno: [phone]  En caso de inclemencias del [place], por favor llame a Lacy Duverney principal al [phone] para una actualizacin sobre el [place] de cualquier retraso o cierre.  Consejos para la medicacin en dermatologa: Por favor, guarde las cajas en las que vienen los medicamentos de uso tpico para ayudarle a seguir las instrucciones sobre dnde y cmo usarlos. Las farmacias generalmente imprimen las instrucciones del medicamento slo en las cajas y no directamente en los tubos del [place].   Si su medicamento es muy caro, por favor, pngase en contacto con Rolm Gala llamando al [phone] y presione la opcin 4 o envenos un mensaje a travs de Clinical cytogeneticist.   No podemos decirle cul ser su copago por los medicamentos por adelantado ya que esto es diferente dependiendo de la cobertura de su seguro. Sin embargo, es posible que podamos encontrar un medicamento sustituto a Audiological scientist un formulario para que el  seguro cubra el medicamento que se considera necesario.   Si se requiere una autorizacin previa para que su compaa de seguros [place] su medicamento, por favor permtanos de 1 a 2 das hbiles para completar [address]  Los precios de los medicamentos varan con frecuencia dependiendo del Environmental consultant de dnde se surte la receta y alguna farmacias pueden ofrecer precios ms baratos.  El sitio web www.goodrx.com tiene cupones para medicamentos de Health and safety inspector. Los precios aqu no tienen en cuenta lo que podra costar con la ayuda del seguro (puede ser ms barato con su seguro), pero el sitio web puede darle el precio si no utiliz Tourist information centre manager.  - Puede imprimir el cupn correspondiente y llevarlo con su receta a la farmacia.  - Tambin puede pasar por nuestra oficina durante el horario de atencin regular y Education officer, museum una tarjeta de cupones de GoodRx.  - Si necesita que su receta se enve electrnicamente a una farmacia diferente,  informe a nuestra oficina a travs de MyChart de [place] o por telfono llamando al [phone] y presione la opcin 4.

## 2022-12-31 NOTE — Progress Notes (Signed)
Follow-Up Visit   Subjective  Dana Livingston is a 52 y.o. female who presents for the following: here for 3 month rosacea follow up. Patient currently using soolantra and has improved. Would like to discuss treatment at redness face.  Patient would also like to discuss schaumbergs purpura at feet. Was given rx of tmc 0.1 % cream to use bid for 4 weeks. Reports it did help with some of redness. Would like to discuss if there are other options to treat.    The following portions of the chart were reviewed this encounter and updated as appropriate: medications, allergies, medical history  Review of Systems:  No other skin or systemic complaints except as noted in HPI or Assessment and Plan.  Objective  Well appearing patient in no apparent distress; mood and affect are within normal limits.  Areas Examined: Face, b/l feet   Relevant physical exam findings are noted in the Assessment and Plan.    Assessment & Plan    ROSACEA  Exam:  Mild erythema right > than left malar cheek, nose, and glabella   Chronic and persistent condition with duration or expected duration over one year. Condition is bothersome/symptomatic for patient. Currently flared.  Patient concerned with redness and would like to treat.   Rosacea is a chronic progressive skin condition usually affecting the face of adults, causing redness and/or acne bumps. It is treatable but not curable. It sometimes affects the eyes (ocular rosacea) as well. It may respond to topical and/or systemic medication and can flare with stress, sun exposure, alcohol, exercise, topical steroids (including hydrocortisone/cortisone 10) and some foods.  Daily application of broad spectrum spf 30+ sunscreen to face is recommended to reduce flares.  Patient has tried and failed topical Metronidazole    Patient denies grittiness of eyes  Treatment Plan Continue hypochlorous Spray (found in the wound care section) OR Cln brand Acne or Sports  wash. The [facility] Hypochlorous Spray can be sprayed on daily and left on. The Cln wash should be applied to the affected area daily for at least 30 seconds and then rinsed off. If you are using clindamycin solution or lotion or another topical antibiotic to treat acne, using a hypochlorous product may help lower the risk of antibiotic resistant bacteria.     Continue Soolantra cream QHS.  Defers doxycycline 40 mg PO qd- Patient defers systemic treatment today (pt has GI upset with TCNs).  Start Azelaic Acid 15 % gel -  After skin is thoroughly washed and patted dry, gently but thoroughly massage a thin film of azelaic acid cream into the affected area daily in the morning   Counseling for BBL / IPL / Laser and Coordination of Care Discussed the treatment option of Broad Band Light (BBL) Windell Moulding Pulsed Light (IPL)/ Laser for skin discoloration, including brown spots and redness.  Typically we recommend at least 1-3 treatment sessions about 5-8 weeks apart for best results.  Cannot have tanned skin when BBL performed, and regular use of sunscreen is advised after the procedure to help maintain results. The patient's condition may also require "maintenance treatments" in the future.  The fee for BBL / laser treatments is $350 per treatment session for the whole face.  A fee can be quoted for other parts of the body.  Insurance typically does not pay for BBL/laser treatments and therefore the fee is an out-of-pocket cost.  Continue sunscreen spf 30+ everyday.    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 Condition is symptomatic/ bothersome to patient. Some improvement with 4 weeks TMC cream but rash still present.   Reassured patient that condition is benign and due to chronic edema. Discoloration will not completely clear, even with topical treatment.  Continue compression stockings daily. If  wearing sandals in the summer recommend sunscreen as sun exposure will darken areas.  May continue TMC 0.1% cream apply twice daily up to 2-4 weeks prn flares. 80 gm , no rfs.  Avoid f/g/a. Caution skin atrophy with long-term use.    Topical steroids (such as triamcinolone, fluocinolone, fluocinonide, mometasone, clobetasol, halobetasol, betamethasone, hydrocortisone) can cause thinning and lightening of the skin if they are used for too long in the same area. Your physician has selected the right strength medicine for your problem and area affected on the body. Please use your medication only as directed by your physician to prevent side effects.    Return in about 1 year (around 12/31/2023) for rosacea .  I, Asher Muir, CMA, am acting as scribe for Willeen Niece, MD.   Documentation: I have reviewed the above documentation for accuracy and completeness, and I agree with the above.  Willeen Niece, MD

## 2023-01-02 ENCOUNTER — Other Ambulatory Visit: Payer: Self-pay

## 2023-01-02 DIAGNOSIS — L719 Rosacea, unspecified: Secondary | ICD-10-CM

## 2023-01-02 MED ORDER — AZELAIC ACID 15 % EX GEL: CUTANEOUS | 11 refills | Status: DC

## 2023-01-02 NOTE — Progress Notes (Signed)
Patient left VM that she has not received RX from yesterday. Re sent to pharmacy. aw

## 2023-02-04 LAB — BASIC METABOLIC PANEL
Creatinine: 0.7 (ref 0.5–1.1)
Glucose: 92

## 2023-02-04 LAB — LIPID PANEL
Cholesterol: 169 (ref 0–200)
HDL: 47 (ref 35–70)
LDL Cholesterol: 106
Triglycerides: 85 (ref 40–160)

## 2023-02-04 LAB — COMPREHENSIVE METABOLIC PANEL: eGFR: 102

## 2023-02-04 LAB — HEMOGLOBIN A1C: Hemoglobin A1C: 5.6

## 2023-02-20 ENCOUNTER — Ambulatory Visit: Payer: 59 | Admitting: Family Medicine

## 2023-02-27 ENCOUNTER — Encounter: Payer: Self-pay | Admitting: Family Medicine

## 2023-02-27 ENCOUNTER — Ambulatory Visit (INDEPENDENT_AMBULATORY_CARE_PROVIDER_SITE_OTHER): Payer: 59 | Admitting: Family Medicine

## 2023-02-27 VITALS — BP 127/79 | HR 77 | Temp 98.4°F | Resp 12 | Ht 64.0 in | Wt 198.2 lb

## 2023-02-27 DIAGNOSIS — I10 Essential (primary) hypertension: Secondary | ICD-10-CM | POA: Diagnosis not present

## 2023-02-27 DIAGNOSIS — E559 Vitamin D deficiency, unspecified: Secondary | ICD-10-CM | POA: Diagnosis not present

## 2023-02-27 DIAGNOSIS — Z0000 Encounter for general adult medical examination without abnormal findings: Secondary | ICD-10-CM | POA: Diagnosis not present

## 2023-02-27 DIAGNOSIS — F410 Panic disorder [episodic paroxysmal anxiety] without agoraphobia: Secondary | ICD-10-CM

## 2023-02-27 DIAGNOSIS — F419 Anxiety disorder, unspecified: Secondary | ICD-10-CM

## 2023-02-27 DIAGNOSIS — E538 Deficiency of other specified B group vitamins: Secondary | ICD-10-CM

## 2023-02-27 DIAGNOSIS — E669 Obesity, unspecified: Secondary | ICD-10-CM

## 2023-02-27 DIAGNOSIS — R739 Hyperglycemia, unspecified: Secondary | ICD-10-CM

## 2023-02-27 DIAGNOSIS — Z6834 Body mass index (BMI) 34.0-34.9, adult: Secondary | ICD-10-CM

## 2023-02-27 MED ORDER — HYDROCHLOROTHIAZIDE 12.5 MG PO TABS: 12.5000 mg | ORAL_TABLET | Freq: Every day | ORAL | 3 refills | Status: DC

## 2023-02-27 MED ORDER — ALPRAZOLAM 0.25 MG PO TABS: 0.2500 mg | ORAL_TABLET | Freq: Every day | ORAL | 2 refills | Status: DC | PRN

## 2023-02-27 NOTE — Progress Notes (Signed)
Complete physical exam  Patient: Dana Livingston   DOB: 1970/09/21   52 y.o. Female  MRN: [id number]  Subjective:    Chief Complaint  Patient presents with   Annual Exam    Patient reports feeling well. She reports good compliance and tolerance to medications. She declined mammogram, reports no family hx of breast cancer.     Tonya Corgan is a 52 y.o. female who presents today for a complete physical exam. She reports consuming a  vegan  diet.  She generally feels well. She reports sleeping well. She does have additional problems to discuss today.   Discussed the use of AI scribe software for clinical note transcription with the patient, who gave verbal consent to proceed.  History of Present Illness   A 52 year old patient with a history of hypertension, vitamin D deficiency, vitamin B12 deficiency, and anxiety with panic attacks presents for her annual physical. She reports that she is taking amlodipine 5mg  daily for hypertension and Xanax 0.25mg  daily as needed for anxiety. She fills her Xanax prescription less frequently than once per month as she does not take it every day.  The patient has concerns about her weight. She reports that she had lost weight in 2022 but gained it back in 2023. She is currently trying to lose weight again but finds it difficult. She is hoping to increase her physical activity as the weather cools down. She also mentions that her cholesterol was better last year when she was exercising more.  The patient also reports leg swelling, which she attributes to her amlodipine medication. She wears compression socks but still experiences swelling. She is interested in switching her hypertension medication to hydrochlorothiazide, which she took in her 30s without any adverse effects.  Regarding her anxiety, the patient reports that it is stable. She has stopped seeing her counselor but is considering starting again. She has not had any anxiety-induced meltdowns in  over a year.        Most recent fall risk assessment:    02/27/2023    3:45 PM  Fall Risk   Falls in the past year? 0  Number falls in past yr: 0  Injury with Fall? 0  Risk for fall due to : No Fall Risks  Follow up Falls evaluation completed     Most recent depression screenings:    02/27/2023    3:45 PM 08/22/2022    4:23 PM  PHQ 2/9 Scores  PHQ - 2 Score 0 0  PHQ- 9 Score  0        Patient Care Team: Erasmo Downer, MD as PCP - General (Family Medicine)   Outpatient Medications Prior to Visit  Medication Sig   Azelaic Acid 15 % gel After skin is thoroughly washed and patted dry, gently but thoroughly massage a thin film of azelaic acid cream into the affected area daily to twice daily, in the morning   cetirizine (ZYRTEC) 10 MG tablet Take 1 tablet (10 mg total) by mouth daily.   Chaste Tree (VITEX EXTRACT PO) Take 2 capsules by mouth daily.    Cholecalciferol (VITAMIN D) 2000 units CAPS Take 1 capsule by mouth daily.    Cyanocobalamin (B-12 SL) Place under the tongue.    doxylamine, Sleep, (UNISOM) 25 MG tablet Take 25 mg by mouth at bedtime as needed.   Ivermectin (SOOLANTRA) 1 % CREA Apply to the face QHS.   Multiple Vitamin (MULTIVITAMIN) capsule Take 1 capsule by mouth daily.  norethindrone-ethinyl estradiol (FYAVOLV) 0.5-2.5 MG-MCG tablet TAKE 1 TABLET BY MOUTH Daily   Omega-3 Fatty Acids (OMEGA 3 500) 500 MG CAPS Take 525 mg by mouth. VEGAN   PASSION FLOWER-VALERIAN PO Take by mouth.    Sodium Hyaluronate, oral, (HYALURONIC ACID) 100 MG CAPS Take by mouth.   TURMERIC PO Take 2 tablets by mouth daily.    [DISCONTINUED] ALPRAZolam (XANAX) 0.25 MG tablet Take 1 tablet (0.25 mg total) by mouth daily as needed for anxiety.   [DISCONTINUED] amLODipine (NORVASC) 5 MG tablet Take 1 tablet (5 mg total) by mouth daily.   [DISCONTINUED] triamcinolone cream (KENALOG) 0.1 % Apply twice daily up to 4 weeks. Avoid applying to face, groin, and axilla. Use as directed.  Long-term use can cause thinning of the skin.   No facility-administered medications prior to visit.    ROS per HPI     Objective:     BP 127/79 (BP Location: Left Arm, Patient Position: Sitting, Cuff Size: Large)   Pulse 77   Temp 98.4 F (36.9 C) (Temporal)   Resp 12   Ht 5\' 4"  (1.626 m)   Wt 198 lb 3.2 oz (89.9 kg)   SpO2 99%   BMI 34.02 kg/m    Physical Exam Vitals reviewed.  Constitutional:      General: She is not in acute distress.    Appearance: Normal appearance. She is well-developed. She is not diaphoretic.  HENT:     Head: Normocephalic and atraumatic.     Right Ear: Tympanic membrane, ear canal and external ear normal.     Left Ear: Tympanic membrane, ear canal and external ear normal.     Nose: Nose normal.     Mouth/Throat:     Mouth: Mucous membranes are moist.     Pharynx: Oropharynx is clear. No oropharyngeal exudate.  Eyes:     General: No scleral icterus.    Conjunctiva/sclera: Conjunctivae normal.     Pupils: Pupils are equal, round, and reactive to light.  Neck:     Thyroid: No thyromegaly.  Cardiovascular:     Rate and Rhythm: Normal rate and regular rhythm.     Pulses: Normal pulses.     Heart sounds: Normal heart sounds. No murmur heard. Pulmonary:     Effort: No respiratory distress.     Breath sounds: Normal breath sounds. No wheezing or rales.  Abdominal:     General: There is no distension.     Palpations: Abdomen is soft.     Tenderness: There is no abdominal tenderness.  Musculoskeletal:        General: No deformity.     Cervical back: Neck supple.     Right lower leg: No edema.     Left lower leg: No edema.  Lymphadenopathy:     Cervical: No cervical adenopathy.  Skin:    General: Skin is warm and dry.     Findings: No rash.  Neurological:     Mental Status: She is alert and oriented to person, place, and time. Mental status is at baseline.     Gait: Gait normal.  Psychiatric:        Mood and Affect: Mood normal.         Behavior: Behavior normal.        Thought Content: Thought content normal.      Results for orders placed or performed in visit on 02/27/23  Basic metabolic panel  Result Value Ref Range   Glucose 92  Creatinine 0.7 0.5 - 1.1  Comprehensive metabolic panel  Result Value Ref Range   eGFR 102   Lipid panel  Result Value Ref Range   Triglycerides 85 40 - 160   Cholesterol 169 0 - 200   HDL 47 35 - 70   LDL Cholesterol 106   Hemoglobin A1c  Result Value Ref Range   Hemoglobin A1C 5.6        Assessment & Plan:    Routine Health Maintenance and Physical Exam  Immunization History  Administered Date(s) Administered   Influenza,inj,Quad PF,6+ Mos 03/11/2019, 03/13/2020, 05/24/2022   Moderna Covid-19 Vaccine Bivalent Booster 75yrs & up 05/04/2021, 05/24/2022   PFIZER(Purple Top)SARS-COV-2 Vaccination 08/26/2019, 09/16/2019, 04/28/2020   Tdap 03/04/2018   Zoster Recombinant(Shingrix) 03/15/2021, 09/13/2021    Health Maintenance  Topic Date Due   MAMMOGRAM  Never done   COVID-19 Vaccine (6 - 2023-24 season) 07/19/2022   INFLUENZA VACCINE  10/13/2023 (Originally 02/13/2023)   Fecal DNA (Cologuard)  04/11/2024   PAP SMEAR-Modifier  10/31/2027   DTaP/Tdap/Td (2 - Td or Tdap) 03/04/2028   Hepatitis C Screening  Completed   HIV Screening  Completed   Zoster Vaccines- Shingrix  Completed   HPV VACCINES  Aged Out    Discussed health benefits of physical activity, and encouraged her to engage in regular exercise appropriate for her age and condition.  Problem List Items Addressed This Visit       Cardiovascular and Mediastinum   Hypertension   Relevant Medications   hydrochlorothiazide (HYDRODIURIL) 12.5 MG tablet     Other   Anxiety   Relevant Medications   ALPRAZolam (XANAX) 0.25 MG tablet   Panic attacks   Relevant Medications   ALPRAZolam (XANAX) 0.25 MG tablet   Obesity   Vitamin D deficiency   Vitamin B12 deficiency   Other Visit Diagnoses     Encounter  for annual physical exam    -  Primary   Hyperglycemia              Hypertension Controlled on Amlodipine 5mg  daily. Patient reports lower extremity swelling, likely secondary to Amlodipine. -Switch Amlodipine to Hydrochlorothiazide 12.5mg  daily. -Monitor blood pressure at home and adjust dose if needed.  Anxiety Stable on Xanax 0.25mg  PRN. Patient reports using less than once per month. -Continue Xanax 0.25mg  PRN. -Refill Xanax prescription.  Obesity Patient reports difficulty losing weight despite attempts at diet and exercise. Discussed potential weight loss medications, but patient declined due to potential side effects. -Encourage continued efforts with diet and exercise. -Consider referral to a personal trainer or physical therapist for safe and effective exercise regimen.  Vitamin D and B12 deficiency Stable on current supplementation. -Continue current supplementation regimen.  General Health Maintenance -Declined mammogram. -Next Cologuard due next year. -Flu shot and COVID booster available next month. -Six month follow-up appointment scheduled.        Return in about 6 months (around 08/30/2023) for chronic disease f/u.     Shirlee Latch, MD

## 2023-05-15 ENCOUNTER — Ambulatory Visit: Payer: 59 | Admitting: Family Medicine

## 2023-05-15 ENCOUNTER — Encounter: Payer: Self-pay | Admitting: Family Medicine

## 2023-05-15 VITALS — BP 135/86 | HR 82 | Ht 64.0 in | Wt 201.0 lb

## 2023-05-15 DIAGNOSIS — H9313 Tinnitus, bilateral: Secondary | ICD-10-CM

## 2023-05-15 DIAGNOSIS — H6992 Unspecified Eustachian tube disorder, left ear: Secondary | ICD-10-CM | POA: Diagnosis not present

## 2023-05-15 DIAGNOSIS — I10 Essential (primary) hypertension: Secondary | ICD-10-CM

## 2023-05-15 MED ORDER — FLUTICASONE PROPIONATE 50 MCG/ACT NA SUSP: 2.0000 | Freq: Every day | NASAL | 6 refills | Status: DC

## 2023-05-15 MED ORDER — HYDROCHLOROTHIAZIDE 25 MG PO TABS: 25.0000 mg | ORAL_TABLET | Freq: Every day | ORAL | 1 refills | Status: DC

## 2023-05-15 NOTE — Progress Notes (Signed)
Acute Office Visit  Subjective:     Patient ID: Dana Livingston, female    DOB: 04-09-1971, 52 y.o.   MRN: [id number]  Chief Complaint  Patient presents with   Ear Problem    Pt stated--left ear ringing but no pain--4 days    HPI Discussed the use of AI scribe software for clinical note transcription with the patient, who gave verbal consent to proceed.  History of Present Illness   The patient, with a history of hypertension and allergies, presents with a chief complaint of ringing in the left ear. The patient describes the onset of the symptom as sudden, likening it to the feeling of impacted wax, which they experienced about thirty years ago. The patient reports that the ringing was initially very loud, but has been gradually decreasing in volume over the past few days. The patient also mentions a sensation of fullness in the ear, which has since resolved. The patient has a history of tinnitus, but describes the current episode as louder than usual.  In addition to the ear complaint, the patient discusses their blood pressure management. They have been on hydrochlorothiazide 12.5mg  daily, but have noticed a gradual increase in their blood pressure readings recently. The patient mentions a few instances of higher readings, followed by a period of not monitoring their blood pressure due to personal stressors and dental issues. The patient expresses concern about the creeping blood pressure readings and is open to adjusting their medication dosage.       ROS      Objective:    BP 135/86 Comment: home reading  Pulse 82   Ht 5\' 4"  (1.626 m)   Wt 201 lb (91.2 kg)   SpO2 98%   BMI 34.50 kg/m    Physical Exam Vitals reviewed.  Constitutional:      General: She is not in acute distress.    Appearance: She is well-developed.  HENT:     Head: Normocephalic and atraumatic.     Right Ear: Tympanic membrane, ear canal and external ear normal.     Left Ear: Tympanic membrane, ear  canal and external ear normal.     Ears:     Comments: Mild effusion of L ear Eyes:     General: No scleral icterus.    Conjunctiva/sclera: Conjunctivae normal.  Cardiovascular:     Rate and Rhythm: Normal rate and regular rhythm.  Pulmonary:     Effort: Pulmonary effort is normal. No respiratory distress.  Skin:    General: Skin is warm and dry.     Findings: No rash.  Neurological:     Mental Status: She is alert and oriented to person, place, and time.  Psychiatric:        Behavior: Behavior normal.     No results found for any visits on 05/15/23.      Assessment & Plan:   Problem List Items Addressed This Visit       Cardiovascular and Mediastinum   Hypertension    Recent home blood pressure readings have been creeping up. Currently on Hydrochlorothiazide 12.5mg  daily. -Increase Hydrochlorothiazide to 25mg  daily. -Check blood pressure at home and report if readings continue to be elevated. -Schedule follow-up appointment in February to reassess blood pressure control.       Relevant Medications   hydrochlorothiazide (HYDRODIURIL) 25 MG tablet   Other Visit Diagnoses     Tinnitus of both ears    -  Primary   Dysfunction of left eustachian tube  Left Ear Tinnitus Increased tinnitus in the left ear, possibly due to Eustachian tube congestion. No pain or hearing loss reported. -Continue Zyrtec as usual. -Start Flonase for 1-2 weeks.       Meds ordered this encounter  Medications   hydrochlorothiazide (HYDRODIURIL) 25 MG tablet    Sig: Take 1 tablet (25 mg total) by mouth daily.    Dispense:  90 tablet    Refill:  1   fluticasone (FLONASE) 50 MCG/ACT nasal spray    Sig: Place 2 sprays into both nostrils daily.    Dispense:  16 g    Refill:  6    Return if symptoms worsen or fail to improve.  Shirlee Latch, MD

## 2023-05-15 NOTE — Assessment & Plan Note (Signed)
Recent home blood pressure readings have been creeping up. Currently on Hydrochlorothiazide 12.5mg  daily. -Increase Hydrochlorothiazide to 25mg  daily. -Check blood pressure at home and report if readings continue to be elevated. -Schedule follow-up appointment in February to reassess blood pressure control.

## 2023-09-01 ENCOUNTER — Ambulatory Visit: Payer: BC Managed Care – PPO | Admitting: Family Medicine

## 2023-09-01 VITALS — BP 123/82 | HR 80 | Resp 18 | Ht 64.0 in | Wt 204.4 lb

## 2023-09-01 DIAGNOSIS — E538 Deficiency of other specified B group vitamins: Secondary | ICD-10-CM | POA: Diagnosis not present

## 2023-09-01 DIAGNOSIS — F410 Panic disorder [episodic paroxysmal anxiety] without agoraphobia: Secondary | ICD-10-CM | POA: Diagnosis not present

## 2023-09-01 DIAGNOSIS — I10 Essential (primary) hypertension: Secondary | ICD-10-CM

## 2023-09-01 DIAGNOSIS — F419 Anxiety disorder, unspecified: Secondary | ICD-10-CM | POA: Diagnosis not present

## 2023-09-01 DIAGNOSIS — Z87898 Personal history of other specified conditions: Secondary | ICD-10-CM

## 2023-09-01 DIAGNOSIS — E559 Vitamin D deficiency, unspecified: Secondary | ICD-10-CM

## 2023-09-01 MED ORDER — CLONAZEPAM 0.5 MG PO TABS: 0.2500 mg | ORAL_TABLET | Freq: Every day | ORAL | 2 refills | Status: DC | PRN

## 2023-09-01 NOTE — Assessment & Plan Note (Signed)
 Hypertension managed with hydrochlorothiazide 25 mg. Blood pressure readings mostly in the 120s, with occasional elevations. Contributing factors include stress and weight gain. Patient is attempting intermittent fasting for weight management and prefers not to add another medication. - Continue hydrochlorothiazide 25 mg - Encourage weight loss through diet and exercise - Monitor blood pressure regularly

## 2023-09-01 NOTE — Assessment & Plan Note (Signed)
 Anxiety managed with alprazolam 0.25 mg as needed. Patient reports clonazepam 0.5 mg was more effective and longer-lasting during a stressful family visit. Prefers clonazepam for its duration and smoother effect. - Switch from alprazolam to clonazepam 025-0.5 mg daily as needed

## 2023-09-01 NOTE — Assessment & Plan Note (Signed)
 As above.

## 2023-09-01 NOTE — Progress Notes (Signed)
 Established patient visit   Patient: Dana Livingston   DOB: Jan 18, 1971   53 y.o. Female  MRN: [id number] Visit Date: 09/01/2023  Today's healthcare provider: Shirlee Latch, MD   Chief Complaint  Patient presents with   Medical Management of Chronic Issues   Subjective    HPI   Discussed the use of AI scribe software for clinical note transcription with the patient, who gave verbal consent to proceed.  History of Present Illness   The patient, with a history of hypertension and anxiety, presents with concerns about her blood pressure which had recently increased. The patient attributes this to stress and weight gain, and has resumed intermittent fasting to manage her weight. The patient reports that her blood pressure has since decreased, but she continues to experience stress-related issues. The patient also reports experiencing heart palpitations, which she attributes to anxiety.  The patient is also experiencing menopausal symptoms, which she finds difficult to distinguish from her mental health issues. She reports an episode of crying and emotional distress, which she believes may be hormone-related. The patient is currently managing her anxiety with Xanax, but is considering switching to Klonopin, which she found to be effective and longer-lasting when she tried it previously.  The patient also expresses concerns about potential future pandemics and inquires about available vaccines. She is unsure about her vaccination history for diseases such as polio and smallpox, and expresses interest in getting vaccinated for hepatitis B and A, and pneumococcal pneumonia.          09/01/2023    3:42 PM 05/15/2023    2:44 PM 02/27/2023    3:45 PM 08/22/2022    4:23 PM 02/20/2022    3:31 PM  Depression screen PHQ 2/9  Decreased Interest 0 0 0 0 0  Down, Depressed, Hopeless 1 0 0 0 0  PHQ - 2 Score 1 0 0 0 0  Altered sleeping 0 1  0   Tired, decreased energy 1 0  0   Change in  appetite 0 0  0   Feeling bad or failure about yourself  0 0  0   Trouble concentrating 0 0  0   Moving slowly or fidgety/restless 0 0  0   Suicidal thoughts 0 0  0   PHQ-9 Score 2 1  0   Difficult doing work/chores Not difficult at all Not difficult at all  Not difficult at all       09/01/2023    3:42 PM 05/15/2023    2:45 PM  GAD 7 : Generalized Anxiety Score  Nervous, Anxious, on Edge 1 1  Control/stop worrying 0 1  Worry too much - different things 0 1  Trouble relaxing 1 1  Restless 0 0  Easily annoyed or irritable 1 1  Afraid - awful might happen 0 1  Total GAD 7 Score 3 6  Anxiety Difficulty  Not difficult at all      Medications: Outpatient Medications Prior to Visit  Medication Sig   Azelaic Acid 15 % gel After skin is thoroughly washed and patted dry, gently but thoroughly massage a thin film of azelaic acid cream into the affected area daily to twice daily, in the morning   cetirizine (ZYRTEC) 10 MG tablet Take 1 tablet (10 mg total) by mouth daily.   Chaste Tree (VITEX EXTRACT PO) Take 2 capsules by mouth daily.    Cholecalciferol (VITAMIN D) 2000 units CAPS Take 1 capsule by mouth daily.  doxylamine, Sleep, (UNISOM) 25 MG tablet Take 25 mg by mouth at bedtime as needed.   fluticasone (FLONASE) 50 MCG/ACT nasal spray Place 2 sprays into both nostrils daily. (Patient taking differently: Place 2 sprays into both nostrils daily. prn)   GARLIC PO Take by mouth.   hydrochlorothiazide (HYDRODIURIL) 25 MG tablet Take 1 tablet (25 mg total) by mouth daily.   Ivermectin (SOOLANTRA) 1 % CREA Apply to the face QHS.   Multiple Vitamin (MULTIVITAMIN) capsule Take 1 capsule by mouth daily.    norethindrone-ethinyl estradiol (FYAVOLV) 0.5-2.5 MG-MCG tablet TAKE 1 TABLET BY MOUTH Daily   PASSION FLOWER-VALERIAN PO Take by mouth.    Sodium Hyaluronate, oral, (HYALURONIC ACID) 100 MG CAPS Take by mouth.   TURMERIC PO Take 2 tablets by mouth daily.    [DISCONTINUED] ALPRAZolam  (XANAX) 0.25 MG tablet Take 1 tablet (0.25 mg total) by mouth daily as needed for anxiety.   Omega-3 Fatty Acids (OMEGA 3 500) 500 MG CAPS Take 525 mg by mouth. VEGAN   [DISCONTINUED] Cyanocobalamin (B-12 SL) Place under the tongue.  (Patient not taking: Reported on 09/01/2023)   No facility-administered medications prior to visit.    Review of Systems     Objective    BP 123/82 Comment: home reading  Pulse 80   Resp 18   Ht 5\' 4"  (1.626 m)   Wt 204 lb 6.4 oz (92.7 kg)   SpO2 98%   BMI 35.09 kg/m    Physical Exam Vitals reviewed.  Constitutional:      General: She is not in acute distress.    Appearance: Normal appearance. She is well-developed. She is not diaphoretic.  HENT:     Head: Normocephalic and atraumatic.  Eyes:     General: No scleral icterus.    Conjunctiva/sclera: Conjunctivae normal.  Neck:     Thyroid: No thyromegaly.  Cardiovascular:     Rate and Rhythm: Normal rate and regular rhythm.     Heart sounds: Normal heart sounds. No murmur heard. Pulmonary:     Effort: Pulmonary effort is normal. No respiratory distress.     Breath sounds: Normal breath sounds. No wheezing, rhonchi or rales.  Musculoskeletal:     Cervical back: Neck supple.     Right lower leg: No edema.     Left lower leg: No edema.  Lymphadenopathy:     Cervical: No cervical adenopathy.  Skin:    General: Skin is warm and dry.     Findings: No rash.  Neurological:     Mental Status: She is alert and oriented to person, place, and time. Mental status is at baseline.  Psychiatric:        Mood and Affect: Mood normal.        Behavior: Behavior normal.      No results found for any visits on 09/01/23.  Assessment & Plan     Problem List Items Addressed This Visit       Cardiovascular and Mediastinum   Hypertension - Primary   Hypertension managed with hydrochlorothiazide 25 mg. Blood pressure readings mostly in the 120s, with occasional elevations. Contributing factors include  stress and weight gain. Patient is attempting intermittent fasting for weight management and prefers not to add another medication. - Continue hydrochlorothiazide 25 mg - Encourage weight loss through diet and exercise - Monitor blood pressure regularly      Relevant Orders   Basic Metabolic Panel (BMET)     Other   Anxiety   Anxiety  managed with alprazolam 0.25 mg as needed. Patient reports clonazepam 0.5 mg was more effective and longer-lasting during a stressful family visit. Prefers clonazepam for its duration and smoother effect. - Switch from alprazolam to clonazepam 025-0.5 mg daily as needed      Panic attacks   As above      Vitamin D deficiency   Relevant Orders   VITAMIN D 25 Hydroxy (Vit-D Deficiency, Fractures)   Vitamin B12 deficiency   Relevant Orders   B12   Other Visit Diagnoses       History of syncope               Hypertension   Situational Depression Situational depression exacerbated by stress and current events. Patient reports feeling down and is on a media fast to manage stress. Considering limited Facebook use to maintain social connections. - Continue media fast with limited Facebook use - Encourage setting boundaries around anxiety-provoking activities - Consider therapy for additional support  Menopausal Symptoms Menopausal symptoms contributing to emotional distress, including crying episodes. Symptoms may be intertwined with situational depression and anxiety. 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 Symptoms include narrowing of vision and fainting in crowded situations, likely a stress response. - Avoid crowded situations when possible - Educate on vasovagal syncope and stress response  General Health Maintenance Discussion of vaccinations and preventive care. Patient concerned about bioweapons and pandemics. Has had  Tdap, MMR, and Shingrix vaccines. Uncertain about polio and smallpox. Has not had hepatitis A or B vaccines. Considering pneumococcal vaccine due to updated guidelines for adults over 50. - Recommend hepatitis A and B vaccines - Consider pneumococcal vaccine - Check vaccination records for polio and smallpox - Update any missing vaccinations as needed  Follow-up - Schedule six-month physical - Order BMP, vitamin D, and B12 labs - Patient to get labs drawn at [facility] or workplace if available.        Return in about 6 months (around 02/29/2024) for CPE.       Shirlee Latch, MD  [facility] [phone] (phone) [phone] (fax)  [facility]

## 2023-09-05 DIAGNOSIS — E538 Deficiency of other specified B group vitamins: Secondary | ICD-10-CM | POA: Diagnosis not present

## 2023-09-05 DIAGNOSIS — I10 Essential (primary) hypertension: Secondary | ICD-10-CM | POA: Diagnosis not present

## 2023-09-05 DIAGNOSIS — E559 Vitamin D deficiency, unspecified: Secondary | ICD-10-CM | POA: Diagnosis not present

## 2023-09-06 LAB — BASIC METABOLIC PANEL
BUN/Creatinine Ratio: 15 (ref 9–23)
BUN: 10 mg/dL (ref 6–24)
CO2: 26 mmol/L (ref 20–29)
Calcium: 9.6 mg/dL (ref 8.7–10.2)
Chloride: 93 mmol/L — ABNORMAL LOW (ref 96–106)
Creatinine, Ser: 0.68 mg/dL (ref 0.57–1.00)
Glucose: 83 mg/dL (ref 70–99)
Potassium: 4.2 mmol/L (ref 3.5–5.2)
Sodium: 133 mmol/L — ABNORMAL LOW (ref 134–144)
eGFR: 103 mL/min/{1.73_m2} (ref >59)

## 2023-09-06 LAB — VITAMIN B12: Vitamin B-12: 749 pg/mL (ref 232–1245)

## 2023-09-06 LAB — VITAMIN D 25 HYDROXY (VIT D DEFICIENCY, FRACTURES): Vit D, 25-Hydroxy: 35.9 ng/mL (ref 30.0–100.0)

## 2023-09-08 ENCOUNTER — Encounter: Payer: Self-pay | Admitting: Family Medicine

## 2023-09-12 ENCOUNTER — Ambulatory Visit: Payer: Self-pay | Admitting: Family Medicine

## 2023-09-12 ENCOUNTER — Encounter: Payer: Self-pay | Admitting: Family Medicine

## 2023-09-12 ENCOUNTER — Other Ambulatory Visit: Payer: Self-pay | Admitting: Family Medicine

## 2023-09-12 MED ORDER — FLUTICASONE PROPIONATE 50 MCG/ACT NA SUSP: 2.0000 | Freq: Every day | NASAL | 1 refills | Status: AC

## 2023-09-12 NOTE — Telephone Encounter (Signed)
 Copied from CRM [id number]. Topic: Clinical - Medical Advice >> Sep 12, 2023  8:32 AM Clide Dales wrote: Patient states that she has experienced severe dizziness for 2 days and has decreased hearing in one ear. Patient states that this has happened before and would like to know if she could do the same treatment as before, Flonase 2x daily. No acute visits available in office until end of next week. Patient declined to go to another office.

## 2023-09-12 NOTE — Telephone Encounter (Signed)
 Answer Assessment - Initial Assessment Questions 1. DESCRIPTION: "Describe your dizziness."     *No Answer* 2. VERTIGO: "Do you feel like either you or the room is spinning or tilting?"      No sx 3. LIGHTHEADED: "Do you feel lightheaded?" (e.g., somewhat faint, woozy, weak upon standing)     *No Answer* 4. SEVERITY: "How bad is it?"  "Can you walk?"   - MILD: Feels slightly dizzy and unsteady, but is walking normally.   - MODERATE: Feels unsteady when walking, but not falling; interferes with normal activities (e.g., school, work).   - SEVERE: Unable to walk without falling, or requires assistance to walk without falling.     *No Answer* 5. ONSET:  "When did the dizziness begin?"     2 days  6. AGGRAVATING FACTORS: "Does anything make it worse?" (e.g., standing, change in head position)     *No Answer* 7. CAUSE: "What do you think is causing the dizziness?"     *No Answer* 8. RECURRENT SYMPTOM: "Have you had dizziness before?" If Yes, ask: "When was the last time?" "What happened that time?"     *No Answer* 9. OTHER SYMPTOMS: "Do you have any other symptoms?" (e.g., headache, weakness, numbness, vomiting, earache)     Feels diff with movement  Protocols used: Dizziness - Vertigo-A-AH  This encounter was created in error - please disregard.

## 2023-09-12 NOTE — Telephone Encounter (Signed)
 Have sent rf fluticasone nasal spray to [facility]. Go to urgent care if this doesn't help.

## 2023-09-12 NOTE — Telephone Encounter (Signed)
 Copied from CRM [id number]. Topic: Clinical - Medical Advice >> Sep 12, 2023  8:32 AM Clide Dales wrote: Patient states that she has experienced severe dizziness for 2 days and has decreased hearing in one ear. Patient states that this has happened before and would like to know if she could do the same treatment as before, Flonase 2x daily. No acute visits available in office until end of next week. Patient declined to go to another office.  Chief Complaint: vertigo  Symptoms: denies now Frequency: 2 days  Pertinent Negatives: Patient denies any sx Disposition: [] ED /[] Urgent Care (no appt availability in office) / [] Appointment(In office/virtual)/ []  [place] [facility]/ [] Home Care/ [x] Refused Recommended Disposition /[] [place] [facility]/ []  Follow-up with PCP Additional Notes: refused OV- restarted Flonase and sx resolved  Reason for Disposition  [1] NO dizziness now AND [2] age > 59    Pt only 52 years  Answer Assessment - Initial Assessment Questions 1. DESCRIPTION: "Describe your dizziness."      2. VERTIGO: "Do you feel like either you or the room is spinning or tilting?"      Room was spinning no longer having any sx after starting Flonase 3. LIGHTHEADED: "Do you feel lightheaded?" (e.g., somewhat faint, woozy, weak upon standing)     no 4. SEVERITY: "How bad is it?"  "Can you walk?"   - MILD: Feels slightly dizzy and unsteady, but is walking normally.   - MODERATE: Feels unsteady when walking, but not falling; interferes with normal activities (e.g., school, work).   - SEVERE: Unable to walk without falling, or requires assistance to walk without falling.     N/a 5. ONSET:  "When did the dizziness begin?"     2 days 6. AGGRAVATING FACTORS: "Does anything make it worse?" (e.g., standing, change in head position)     Change in head position 7. CAUSE: "What do you think is causing the dizziness?"     Blocked eustachian tube 8. RECURRENT SYMPTOM: "Have you had dizziness  before?" If Yes, ask: "When was the last time?" "What happened that time?"     Yes  9. OTHER SYMPTOMS: "Do you have any other symptoms?" (e.g., headache, weakness, numbness, vomiting, earache)     none 10. PREGNANCY: "Is there any chance you are pregnant?" "When was your last menstrual period?"       N/a  Protocols used: Dizziness - Vertigo-A-AH

## 2023-09-30 ENCOUNTER — Ambulatory Visit (INDEPENDENT_AMBULATORY_CARE_PROVIDER_SITE_OTHER): Payer: 59 | Admitting: Dermatology

## 2023-09-30 DIAGNOSIS — L732 Hidradenitis suppurativa: Secondary | ICD-10-CM

## 2023-09-30 DIAGNOSIS — L609 Nail disorder, unspecified: Secondary | ICD-10-CM

## 2023-09-30 DIAGNOSIS — W908XXA Exposure to other nonionizing radiation, initial encounter: Secondary | ICD-10-CM

## 2023-09-30 DIAGNOSIS — D492 Neoplasm of unspecified behavior of bone, soft tissue, and skin: Secondary | ICD-10-CM

## 2023-09-30 DIAGNOSIS — Z79899 Other long term (current) drug therapy: Secondary | ICD-10-CM

## 2023-09-30 DIAGNOSIS — E882 Lipomatosis, not elsewhere classified: Secondary | ICD-10-CM

## 2023-09-30 DIAGNOSIS — D229 Melanocytic nevi, unspecified: Secondary | ICD-10-CM

## 2023-09-30 DIAGNOSIS — L821 Other seborrheic keratosis: Secondary | ICD-10-CM

## 2023-09-30 DIAGNOSIS — D1801 Hemangioma of skin and subcutaneous tissue: Secondary | ICD-10-CM

## 2023-09-30 DIAGNOSIS — L578 Other skin changes due to chronic exposure to nonionizing radiation: Secondary | ICD-10-CM | POA: Diagnosis not present

## 2023-09-30 DIAGNOSIS — L814 Other melanin hyperpigmentation: Secondary | ICD-10-CM

## 2023-09-30 DIAGNOSIS — L719 Rosacea, unspecified: Secondary | ICD-10-CM

## 2023-09-30 DIAGNOSIS — Z1283 Encounter for screening for malignant neoplasm of skin: Secondary | ICD-10-CM | POA: Diagnosis not present

## 2023-09-30 DIAGNOSIS — L817 Pigmented purpuric dermatosis: Secondary | ICD-10-CM

## 2023-09-30 DIAGNOSIS — D225 Melanocytic nevi of trunk: Secondary | ICD-10-CM

## 2023-09-30 MED ORDER — IVERMECTIN 1 % EX CREA: TOPICAL_CREAM | CUTANEOUS | 11 refills | Status: AC

## 2023-09-30 MED ORDER — AZELAIC ACID 15 % EX GEL: CUTANEOUS | 11 refills | Status: AC

## 2023-09-30 NOTE — Progress Notes (Signed)
 Follow-Up Visit   Subjective  Dana Livingston is a 53 y.o. female who presents for the following: Skin Cancer Screening and Full Body Skin Exam, Rosacea, Soolantra qhs,  Azelaic Acig gel bid, check spot R arm, tender  The patient presents for Total-Body Skin Exam (TBSE) for skin cancer screening and mole check. The patient has spots, moles and lesions to be evaluated, some may be new or changing and the patient may have concern these could be cancer.    The following portions of the chart were reviewed this encounter and updated as appropriate: medications, allergies, medical history  Review of Systems:  No other skin or systemic complaints except as noted in HPI or Assessment and Plan.  Objective  Well appearing patient in no apparent distress; mood and affect are within normal limits.  A full examination was performed including scalp, head, eyes, ears, nose, lips, neck, chest, axillae, abdomen, back, buttocks, bilateral upper extremities, bilateral lower extremities, hands, feet, fingers, toes, fingernails, and toenails. All findings within normal limits unless otherwise noted below.   Relevant physical exam findings are noted in the Assessment and Plan.  Right Axilla 7.50mm yellow tan pap     Assessment & Plan   SKIN CANCER SCREENING PERFORMED TODAY.  ACTINIC DAMAGE - Chronic condition, secondary to cumulative UV/sun exposure - diffuse scaly erythematous macules with underlying dyspigmentation - Recommend daily broad spectrum sunscreen SPF 30+ to sun-exposed areas, reapply every 2 hours as needed.  - Staying in the shade or wearing long sleeves, sun glasses (UVA+UVB protection) and wide brim hats (4-inch brim around the entire circumference of the hat) are also recommended for sun protection.  - Call for new or changing lesions.  LENTIGINES, SEBORRHEIC KERATOSES, HEMANGIOMAS - Benign normal skin lesions - Benign-appearing - Call for any changes  MELANOCYTIC NEVI -  Tan-brown and/or pink-flesh-colored symmetric macules and papules - L mid back 5.0 x 3.70mm tan speckled papule - Benign appearing on exam today - Observation - Call clinic for new or changing moles - Recommend daily use of broad spectrum spf 30+ sunscreen to sun-exposed areas.    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 Condition is improving with treatment but not currently at goal.   Rosacea is a chronic progressive skin condition usually affecting the face of adults, causing redness and/or acne bumps. It is treatable but not curable. It sometimes affects the eyes (ocular rosacea) as well. It may respond to topical and/or systemic medication and can flare with stress, sun exposure, alcohol, exercise, topical steroids (including hydrocortisone/cortisone 10) and some foods.  Daily application of broad spectrum spf 30+ sunscreen to face is recommended to reduce flares.  Patient denies grittiness of the eyes  Treatment Plan Cont Ivermectin cr qhs Cont Azelaic Acid gel bid   NAIL PROBLEM Exam: nail dystrophy L great toenail distal 40% yellow white discoloration  Treatment Plan: Discussed topical antifungal treatment, pt prefers to observe since not bothersome  HIDRADENITIS SUPPURATIVA Inguinal crease Exam: small pink scars inguinal crease  Chronic condition with duration or expected duration over one year. Currently well-controlled.   Hidradenitis Suppurativa is a chronic; persistent; non-curable, but treatable condition due to abnormal inflamed sweat glands in the body folds (axilla, inframammary, groin, medial thighs), causing recurrent painful draining cysts and scarring. It can be associated with severe scarring acne and cysts; also abscesses and scarring of scalp. The goal is control and  prevention of flares, as it is not curable. Scars are permanent and can be  thickened. Treatment may include daily use of topical medication and oral antibiotics.  Oral isotretinoin may also be helpful.  For some cases, Humira or Cosentyx (biologic injections) may be prescribed to decrease the inflammatory process and prevent flares.  When indicated, inflamed cysts may also be treated surgically.  Treatment Plan: Cont otc Tumeric  Recommend Zeasorb powder every day prn  SCHAMBERG'S PURPURA Bil feet Exam: yellow brown macules bil feet dorsum, steroid cream didn't help  Chronic and persistent condition with duration or expected duration over one year. Condition is not symptomatic / bothersome to patient.   Treatment Plan: Benign, observe, not treatment at this time  NEOPLASM OF SKIN Right Axilla Epidermal / dermal shaving  Lesion diameter (cm):  0.7 Informed consent: discussed and consent obtained   Patient was prepped and draped in usual sterile fashion: area prepped with alcohol. Anesthesia: the lesion was anesthetized in a standard fashion   Anesthetic:  1% lidocaine w/ epinephrine 1-100,000 buffered w/ 8.4% NaHCO3 Instrument used: flexible razor blade   Hemostasis achieved with: pressure, aluminum chloride and electrodesiccation   Outcome: patient tolerated procedure well   Post-procedure details: wound care instructions given   Post-procedure details comment:  Ointment and small bandage applied Irritated nevus r/o Atypia vs Fibrolipoma vs Cyst  Related Procedures Anatomic Pathology Report ROSACEA   Related Medications Azelaic Acid 15 % gel After skin is thoroughly washed and patted dry, gently but thoroughly massage a thin film of azelaic acid cream into the affected area daily to twice daily, in the morning Ivermectin (SOOLANTRA) 1 % CREA Apply to the face QHS for Rosacea Return in about 1 year (around 09/29/2024).  I, Ardis Rowan, RMA, am acting as scribe for Willeen Niece, MD .   Documentation: I have reviewed the above documentation for  accuracy and completeness, and I agree with the above.  Willeen Niece, MD

## 2023-09-30 NOTE — Patient Instructions (Addendum)
 Wound Care Instructions  Cleanse wound gently with soap and water once a day then pat dry with clean gauze. Apply a thin coat of Petrolatum (petroleum jelly, "Vaseline") over the wound (unless you have an allergy to this). We recommend that you use a new, sterile tube of Vaseline. Do not pick or remove scabs. Do not remove the yellow or white "healing tissue" from the base of the wound.  Cover the wound with fresh, clean, nonstick gauze and secure with paper tape. You may use Band-Aids in place of gauze and tape if the wound is small enough, but would recommend trimming much of the tape off as there is often too much. Sometimes Band-Aids can irritate the skin.  You should call the office for your biopsy report after 1 week if you have not already been contacted.  If you experience any problems, such as abnormal amounts of bleeding, swelling, significant bruising, significant pain, or evidence of infection, please call the office immediately.  FOR ADULT SURGERY PATIENTS: If you need something for pain relief you may take 1 extra strength Tylenol (acetaminophen) AND 2 Ibuprofen (200mg  each) together every 4 hours as needed for pain. (do not take these if you are allergic to them or if you have a reason you should not take them.) Typically, you may only need pain medication for 1 to 3 days.   Recommend OTC Zeasorb AF powder to body folds daily after shower.  It is often found in the athlete's foot section in the pharmacy.  Avoid using powders that contain cornstarch.   Due to recent changes in healthcare laws, you may see results of your pathology and/or laboratory studies on MyChart before the doctors have had a chance to review them. We understand that in some cases there may be results that are confusing or concerning to you. Please understand that not all results are received at the same time and often the doctors may need to interpret multiple results in order to provide you with the best plan of  care or course of treatment. Therefore, we ask that you please give [place] 2 business days to thoroughly review all your results before contacting the office for clarification. Should we see a critical lab result, you will be contacted sooner.   If You Need Anything After Your Visit  If you have any questions or concerns for your doctor, please call our main line at [phone] and press option 4 to reach your doctor's medical assistant. If no one answers, please leave a voicemail as directed and we will return your call as soon as possible. Messages left after 4 pm will be answered the following business day.   You may also send [place] a message via MyChart. We typically respond to MyChart messages within 1-2 business days.  For prescription refills, please ask your pharmacy to contact our office. Our fax number is [phone].  If you have an urgent issue when the clinic is closed that cannot wait until the next business day, you can page your doctor at the number below.    Please note that while we do our best to be available for urgent issues outside of office hours, we are not available 24/7.   If you have an urgent issue and are unable to reach [place], you may choose to seek medical care at your doctor's office, retail clinic, urgent care center, or emergency room.  If you have a medical emergency, please immediately call 911 or go to the emergency department.  Pager Numbers  - Dr. Gwen Pounds: [phone]  - Dr. Roseanne Reno: [phone]  - Dr. Katrinka Blazing: [phone]   In the event of inclement weather, please call our main line at [phone] for an update on the status of any delays or closures.  Dermatology Medication Tips: Please keep the boxes that topical medications come in in order to help keep track of the instructions about where and how to use these. Pharmacies typically print the medication instructions only on the boxes and not directly on the medication tubes.   If your medication  is too expensive, please contact our office at [phone] option 4 or send [place] a message through MyChart.   We are unable to tell what your co-pay for medications will be in advance as this is different depending on your insurance coverage. However, we may be able to find a substitute medication at lower cost or fill out paperwork to get insurance to cover a needed medication.   If a prior authorization is required to get your medication covered by your insurance company, please allow [place] 1-2 business days to complete this process.  Drug prices often vary depending on where the prescription is filled and some pharmacies may offer cheaper prices.  The website www.goodrx.com contains coupons for medications through different pharmacies. The prices here do not account for what the cost may be with help from insurance (it may be cheaper with your insurance), but the website can give you the price if you did not use any insurance.  - You can print the associated coupon and take it with your prescription to the pharmacy.  - You may also stop by our office during regular business hours and pick up a GoodRx coupon card.  - If you need your prescription sent electronically to a different pharmacy, notify our office through [facility] or by phone at [phone] option 4.     Si Usted Necesita Algo Despus de Su Visita  Tambin puede enviarnos un mensaje a travs de Clinical cytogeneticist. Por lo general respondemos a los mensajes de MyChart en el transcurso de 1 a 2 das hbiles.  Para renovar recetas, por favor pida a su farmacia que se ponga en contacto con nuestra oficina. Annie Sable de fax es [place] [phone].  Si tiene un asunto urgente cuando la clnica est cerrada y que no puede esperar hasta el siguiente da hbil, puede llamar/localizar a su doctor(a) al nmero que aparece a continuacin.   Por favor, tenga en cuenta que aunque hacemos todo lo posible para estar disponibles para asuntos  urgentes fuera del horario de [place], no estamos disponibles las 24 horas del da, los 7 [address]  [address] de la [place].   Si tiene un problema urgente y no puede comunicarse con nosotros, puede optar por buscar atencin mdica  en el consultorio de su doctor(a), en una clnica privada, en un centro de atencin urgente o en una sala de emergencias.  Si tiene Engineer, drilling, por favor llame inmediatamente al 911 o vaya a la sala de emergencias.  Nmeros de bper  - Dr. Gwen Pounds: [phone]  - Dra. Roseanne Reno: [phone]  - Dr. Katrinka Blazing: [phone]   En caso de inclemencias del tiempo, por favor llame a Lacy Duverney principal al [phone] para una actualizacin sobre el [place] de cualquier retraso o cierre.  Consejos para la medicacin en dermatologa: Por favor, guarde las cajas en las que vienen los medicamentos de uso tpico para ayudarle a seguir las instrucciones sobre dnde y cmo usarlos. [employer]  generalmente imprimen las instrucciones del medicamento slo en las cajas y no directamente en los tubos del [place].   Si su medicamento es muy caro, por favor, pngase en contacto con Rolm Gala llamando al [phone] y presione la opcin 4 o envenos un mensaje a travs de Clinical cytogeneticist.   No podemos decirle cul ser su copago por los medicamentos por adelantado ya que esto es diferente dependiendo de la cobertura de su seguro. Sin embargo, es posible que podamos encontrar un medicamento sustituto a Audiological scientist un formulario para que el seguro cubra el medicamento que se considera necesario.   Si se requiere una autorizacin previa para que su compaa de seguros [place] su medicamento, por favor permtanos de 1 a 2 das hbiles para completar [address]  Los precios de los medicamentos varan con frecuencia dependiendo del Environmental consultant de dnde se surte la receta y alguna farmacias pueden ofrecer precios ms baratos.  El sitio web www.goodrx.com tiene cupones para  medicamentos de Health and safety inspector. Los precios aqu no tienen en cuenta lo que podra costar con la ayuda del seguro (puede ser ms barato con su seguro), pero el sitio web puede darle el precio si no utiliz Tourist information centre manager.  - Puede imprimir el cupn correspondiente y llevarlo con su receta a la farmacia.  - Tambin puede pasar por nuestra oficina durante el horario de atencin regular y Education officer, museum una tarjeta de cupones de GoodRx.  - Si necesita que su receta se enve electrnicamente a una farmacia diferente, informe a nuestra oficina a travs de MyChart de [place] o por telfono llamando al [phone] y presione la opcin 4.

## 2023-10-01 DIAGNOSIS — D492 Neoplasm of unspecified behavior of bone, soft tissue, and skin: Secondary | ICD-10-CM | POA: Diagnosis not present

## 2023-10-01 DIAGNOSIS — E882 Lipomatosis, not elsewhere classified: Secondary | ICD-10-CM | POA: Diagnosis not present

## 2023-10-07 LAB — ANATOMIC PATHOLOGY REPORT

## 2023-10-08 ENCOUNTER — Telehealth: Payer: Self-pay

## 2023-10-08 NOTE — Telephone Encounter (Signed)
-----   Message from Willeen Niece sent at 10/07/2023  6:59 PM EDT ----- Part A-Right axilla,Skin Biopsy: NEVUS LIPOMATOSIS SUPERFICIALIS.   Benign fatty growth - please call patient

## 2023-10-08 NOTE — Telephone Encounter (Signed)
 Advised pt of bx results/sh ?

## 2023-11-02 ENCOUNTER — Other Ambulatory Visit: Payer: Self-pay | Admitting: Family Medicine

## 2023-11-11 ENCOUNTER — Ambulatory Visit (INDEPENDENT_AMBULATORY_CARE_PROVIDER_SITE_OTHER): Admitting: Obstetrics and Gynecology

## 2023-11-11 ENCOUNTER — Encounter: Payer: Self-pay | Admitting: Obstetrics and Gynecology

## 2023-11-11 VITALS — BP 173/79 | HR 69 | Ht 64.0 in | Wt 206.0 lb

## 2023-11-11 DIAGNOSIS — Z1151 Encounter for screening for human papillomavirus (HPV): Secondary | ICD-10-CM

## 2023-11-11 DIAGNOSIS — Z1231 Encounter for screening mammogram for malignant neoplasm of breast: Secondary | ICD-10-CM

## 2023-11-11 DIAGNOSIS — Z124 Encounter for screening for malignant neoplasm of cervix: Secondary | ICD-10-CM

## 2023-11-11 DIAGNOSIS — Z01419 Encounter for gynecological examination (general) (routine) without abnormal findings: Secondary | ICD-10-CM

## 2023-11-11 DIAGNOSIS — Z79890 Hormone replacement therapy (postmenopausal): Secondary | ICD-10-CM

## 2023-11-11 DIAGNOSIS — Z1211 Encounter for screening for malignant neoplasm of colon: Secondary | ICD-10-CM

## 2023-11-11 DIAGNOSIS — N951 Menopausal and female climacteric states: Secondary | ICD-10-CM

## 2023-11-11 MED ORDER — NORETHINDRONE-ETH ESTRADIOL 0.5-2.5 MG-MCG PO TABS
ORAL_TABLET | ORAL | 3 refills | Status: AC
Start: 2023-11-11 — End: ?

## 2023-11-11 NOTE — Progress Notes (Signed)
 PCP: Mazie Speed, MD   Chief Complaint  Patient presents with   Gynecologic Exam    No concerns    HPI:      [name] is a 53 y.o. G0P0000 whose LMP was No LMP recorded. Patient is perimenopausal., presents today for her annual examination.  Her menses are infrequent now on HRT, no bleeding for 5 months. Were monthly on HRT (never went a year without bleeding, started on Lo Loestrin for VS sx), lasting 5-7 days, spotting with 1 heavy day of mod flow; does have mid cycle spotting (normal for pt), mild dysmen. On Femhrt for HRT, doing cyclically.  Pt's vasomotor sx well controlled with Femhrt. Occas has LLQ discomfort with ovulation.   Sex activity: single partner. No pain/bleeding/dryness   Last Pap: 10/31/22  Results were: no abnormalities /neg HPV DNA. Pt works at [employer] and wants pap.   Last mammogram: pt has researched and declines There is no FH of breast cancer. There is no FH of ovarian cancer. The patient does do self-breast exams.  Colonoscopy: never; neg Cologuard 9/22;  Repeat due after 3 years; will do order through PCP.   Tobacco use: The patient denies current or previous tobacco use. Alcohol use: none No drug use Exercise: min active  She does get adequate calcium and Vitamin D  in her diet.  Labs with PCP.   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   Elevated cancer antigen 125 (CA-125) 01/18/2015   Fibroids 01/18/2015    Past Surgical History:  Procedure Laterality Date   CHOLECYSTECTOMY  1991   LAPAROSCOPIC OVARIAN CYSTECTOMY Left 1998   LASIK  2001   ROBOTIC ASSISTED LAPAROSCOPIC OVARIAN CYSTECTOMY Left 02/14/2015   Procedure: ROBOTIC ASSISTED DRAINAGE LEFT OVARIAN CYST;  FULAGRATION ON PERITONEAL ENDOMETRIOSIS;  Surgeon: Andra Kava, MD;  Location: [facility];  Service: Gynecology;  Laterality: Left;   ROBOTIC ASSISTED SALPINGO OOPHERECTOMY Right 02/14/2015   Procedure: ROBOTIC ASSISTED RIGHT SALPINGO OOPHORECTOMY;  Surgeon: Andra Kava, MD;  Location: [facility];  Service: Gynecology;  Laterality: Right;   TONSILLECTOMY  1981   WISDOM TOOTH EXTRACTION      Family History  Problem Relation Age of Onset   Thyroid disease Mother    Hypertension Mother    Stroke Mother    Hyperlipidemia Mother        with atherosclerosis   COPD Father        former smoker   Hypertension Father    Heart failure Father        former smoker    Colon cancer Paternal Uncle    Lung cancer Maternal Grandmother    COPD Maternal Grandfather    Breast cancer Neg Hx    Ovarian cancer Neg Hx    Cervical cancer Neg Hx     Social History   Socioeconomic History   Marital status: Married    Spouse name: Joyice Nodal   Number of children: 0   Years of education: masters x 2   Highest education level: Master's degree (e.g., MA, MS, MEng, MEd, MSW, MBA)  Occupational History    Employer: [employer]  Tobacco Use   Smoking status: Never   Smokeless  tobacco: Never  Vaping Use   Vaping status: Never Used  Substance and Sexual Activity   Alcohol use: No   Drug use: No   Sexual activity: Not Currently    Birth control/protection: Pill  Other Topics Concern   Not on file  Social History Narrative   Not on file   Social Drivers of Health   Financial Resource Strain: Low Risk  (08/29/2023)   Overall Financial Resource Strain (CARDIA)    Difficulty of Paying Living Expenses: Not hard at all  Food Insecurity: No Food Insecurity (08/29/2023)   Hunger Vital Sign    Worried About Running Out of Food in the Last Year: Never true    Ran Out of Food in the Last Year: Never true  Transportation Needs: No Transportation Needs (08/29/2023)   PRAPARE - Scientist, research (physical sciences) (Medical): No    Lack of Transportation (Non-Medical): No  Physical Activity: Insufficiently Active (08/29/2023)   Exercise Vital Sign    Days of Exercise per Week: 2 days    Minutes of Exercise per Session: 20 min  Stress: Stress Concern Present (08/29/2023)   [employer] of Occupational Health - Occupational Stress Questionnaire    Feeling of Stress : Rather much  Social Connections: Moderately Isolated (08/29/2023)   Social Connection and Isolation Panel [NHANES]    Frequency of Communication with Friends and Family: Twice a week    Frequency of Social Gatherings with Friends and Family: Once a week    Attends Religious Services: Never    Database administrator or Organizations: No    Attends Engineer, structural: Not on file    Marital Status: Married  Catering manager Violence: Not on file     Current Outpatient Medications:    Azelaic Acid  15 % gel, After skin is thoroughly washed and patted dry, gently but thoroughly massage a thin film of azelaic acid  cream into the affected area daily to twice daily, in the morning, Disp: 30 g, Rfl: 11   cetirizine  (ZYRTEC ) 10 MG tablet, Take 1 tablet (10 mg total) by mouth daily., Disp: 30 tablet, Rfl: 0   Chaste Tree (VITEX EXTRACT PO), Take 2 capsules by mouth daily. , Disp: , Rfl:    Cholecalciferol (VITAMIN D ) 2000 units CAPS, Take 1 capsule by mouth daily. , Disp: , Rfl:    clonazePAM  (KLONOPIN ) 0.5 MG tablet, Take 0.5-1 tablets (0.25-0.5 mg total) by mouth daily as needed for anxiety., Disp: 15 tablet, Rfl: 2   doxylamine, Sleep, (UNISOM) 25 MG tablet, Take 25 mg by mouth at bedtime as needed., Disp: , Rfl:    fluticasone  (FLONASE ) 50 MCG/ACT nasal spray, Place 2 sprays into both nostrils daily. prn, Disp: 16 mL, Rfl: 1   GARLIC PO, Take by mouth., Disp: , Rfl:    hydrochlorothiazide  (HYDRODIURIL ) 25 MG tablet, TAKE 1 TABLET(25 MG) BY MOUTH DAILY, Disp: 90 tablet, Rfl: 1   Ivermectin  (SOOLANTRA ) 1 % CREA,  Apply to the face QHS for Rosacea, Disp: 45 g, Rfl: 11   Multiple Vitamin (MULTIVITAMIN) capsule, Take 1 capsule by mouth daily. , Disp: , Rfl:    Omega-3 Fatty Acids (OMEGA 3 500) 500 MG CAPS, Take 525 mg by mouth. VEGAN, Disp: , Rfl:    PASSION FLOWER-VALERIAN PO, Take by mouth. , Disp: , Rfl:    Sodium Hyaluronate, oral, (HYALURONIC ACID) 100 MG CAPS, Take by mouth., Disp: , Rfl:    TURMERIC PO, Take 2 tablets by mouth  daily. , Disp: , Rfl:    norethindrone -ethinyl estradiol  (FYAVOLV) 0.5-2.5 MG-MCG tablet, TAKE 1 TABLET BY MOUTH Daily, Disp: 84 tablet, Rfl: 3     ROS:  Review of Systems  Constitutional:  Negative for fatigue, fever and unexpected weight change.  Respiratory:  Negative for cough, shortness of breath and wheezing.   Cardiovascular:  Negative for chest pain, palpitations and leg swelling.  Gastrointestinal:  Negative for blood in stool, constipation, diarrhea, nausea and vomiting.  Endocrine: Negative for cold intolerance, heat intolerance and polyuria.  Genitourinary:  Negative for dyspareunia, dysuria, flank pain, frequency, genital sores, hematuria, menstrual problem, pelvic pain, urgency, vaginal bleeding, vaginal discharge and vaginal pain.  Musculoskeletal:  Negative for back pain, joint swelling and myalgias.  Skin:  Negative for rash.  Neurological:  Negative for dizziness, syncope, light-headedness, numbness and headaches.  Hematological:  Negative for adenopathy.  Psychiatric/Behavioral:  Negative for agitation, confusion, sleep disturbance and suicidal ideas. The patient is not nervous/anxious.    BREAST: No symptoms    Objective: BP (!) 173/79   Pulse 69   Ht 5\' 4"  (1.626 m)   Wt 206 lb (93.4 kg)   BMI 35.36 kg/m  On BP meds with PCP; had to wait for appt today  Physical Exam Constitutional:      Appearance: She is well-developed.  Genitourinary:     Vulva normal.     Right Labia: No rash, tenderness or lesions.    Left Labia: No tenderness,  lesions or rash.    No vaginal discharge, erythema or tenderness.      Right Adnexa: not tender and no mass present.    Left Adnexa: not tender and no mass present.    No cervical friability or polyp.     Uterus is not enlarged or tender.  Breasts:    Right: No mass, nipple discharge, skin change or tenderness.     Left: No mass, nipple discharge, skin change or tenderness.  Neck:     Thyroid: No thyromegaly.  Cardiovascular:     Rate and Rhythm: Normal rate and regular rhythm.     Heart sounds: Normal heart sounds. No murmur heard. Pulmonary:     Effort: Pulmonary effort is normal.     Breath sounds: Normal breath sounds.  Abdominal:     Palpations: Abdomen is soft.     Tenderness: There is no abdominal tenderness. There is no guarding or rebound.  Musculoskeletal:        General: Normal range of motion.     Cervical back: Normal range of motion.  Lymphadenopathy:     Cervical: No cervical adenopathy.  Neurological:     General: No focal deficit present.     Mental Status: She is alert and oriented to person, place, and time.     Cranial Nerves: No cranial nerve deficit.  Skin:    General: Skin is warm and dry.  Psychiatric:        Mood and Affect: Mood normal.        Behavior: Behavior normal.        Thought Content: Thought content normal.        Judgment: Judgment normal.  Vitals reviewed.    Assessment/Plan:  Encounter for annual routine gynecological examination  Cervical cancer screening - Plan: IGP, Aptima HPV  Screening for HPV (human papillomavirus) - Plan: IGP, Aptima HPV; pt pref  Encounter for screening mammogram for malignant neoplasm of breast; pt declines  Screening for colon cancer--will do order with  PCP  Perimenopausal symptoms - Plan: norethindrone -ethinyl estradiol  (FYAVOLV) 0.5-2.5 MG-MCG tablet; doing well, Rx eRxd. F/u prn AUB  Hormone replacement therapy (HRT) - Plan: norethindrone -ethinyl estradiol  (FYAVOLV) 0.5-2.5 MG-MCG  tablet    Meds ordered this encounter  Medications   norethindrone -ethinyl estradiol  (FYAVOLV) 0.5-2.5 MG-MCG tablet    Sig: TAKE 1 TABLET BY MOUTH Daily    Dispense:  84 tablet    Refill:  3    Supervising Provider:   ROBY, MICIA [[id number]]            GYN counsel breast self exam, mammography screening, adequate intake of calcium and vitamin D , diet and exercise    F/U  Return in about 1 year (around 11/10/2024).  Esti Demello B. Havoc Sanluis, PA-C 11/11/2023 5:21 PM

## 2023-11-11 NOTE — Patient Instructions (Signed)
 I value your feedback and you entrusting [place] with your care. If you get a [facility] patient survey, I would appreciate you taking the time to let us know about your experience today. Thank you!  [facility] ([place])--[phone]

## 2023-11-20 LAB — IGP, APTIMA HPV: HPV Aptima: NEGATIVE

## 2023-12-13 ENCOUNTER — Emergency Department
Admission: EM | Admit: 2023-12-13 | Discharge: 2023-12-13 | Disposition: A | Discharge status: Home / Self Care | Attending: Emergency Medicine | Admitting: Emergency Medicine

## 2023-12-13 ENCOUNTER — Emergency Department

## 2023-12-13 ENCOUNTER — Other Ambulatory Visit: Payer: Self-pay

## 2023-12-13 DIAGNOSIS — I493 Ventricular premature depolarization: Secondary | ICD-10-CM

## 2023-12-13 DIAGNOSIS — E876 Hypokalemia: Secondary | ICD-10-CM | POA: Diagnosis not present

## 2023-12-13 DIAGNOSIS — R002 Palpitations: Secondary | ICD-10-CM | POA: Diagnosis not present

## 2023-12-13 DIAGNOSIS — E871 Hypo-osmolality and hyponatremia: Secondary | ICD-10-CM

## 2023-12-13 DIAGNOSIS — I10 Essential (primary) hypertension: Secondary | ICD-10-CM | POA: Insufficient documentation

## 2023-12-13 LAB — BASIC METABOLIC PANEL WITH GFR
Anion gap: 10 (ref 5–15)
BUN: 11 mg/dL (ref 6–20)
CO2: 25 mmol/L (ref 22–32)
Calcium: 9 mg/dL (ref 8.9–10.3)
Chloride: 95 mmol/L — ABNORMAL LOW (ref 98–111)
Creatinine, Ser: 0.69 mg/dL (ref 0.44–1.00)
GFR, Estimated: >60 mL/min (ref >60)
Glucose, Bld: 168 mg/dL — ABNORMAL HIGH (ref 70–99)
Potassium: 3.3 mmol/L — ABNORMAL LOW (ref 3.5–5.1)
Sodium: 130 mmol/L — ABNORMAL LOW (ref 135–145)

## 2023-12-13 LAB — CBC
HCT: 40.8 % (ref 36.0–46.0)
Hemoglobin: 14.4 g/dL (ref 12.0–15.0)
MCH: 32.2 pg (ref 26.0–34.0)
MCHC: 35.3 g/dL (ref 30.0–36.0)
MCV: 91.3 fL (ref 80.0–100.0)
Platelets: 341 10*3/uL (ref 150–400)
RBC: 4.47 MIL/uL (ref 3.87–5.11)
RDW: 11.7 % (ref 11.5–15.5)
WBC: 8.8 10*3/uL (ref 4.0–10.5)
nRBC: 0 % (ref 0.0–0.2)

## 2023-12-13 LAB — POC URINE PREG, ED: Preg Test, Ur: NEGATIVE

## 2023-12-13 LAB — TROPONIN I (HIGH SENSITIVITY): Troponin I (High Sensitivity): 3 ng/L (ref <18)

## 2023-12-13 MED ORDER — SODIUM CHLORIDE 0.9 % IV BOLUS
1000.0000 mL | Freq: Once | INTRAVENOUS | Status: AC
  Administered 2023-12-13: 1000 mL via INTRAVENOUS

## 2023-12-13 MED ORDER — POTASSIUM CHLORIDE 10 MEQ/100ML IV SOLN
10.0000 meq | Freq: Once | INTRAVENOUS | Status: AC
  Administered 2023-12-13: 10 meq via INTRAVENOUS
  Filled 2023-12-13: qty 100

## 2023-12-13 MED ORDER — LOSARTAN POTASSIUM 50 MG PO TABS: 50.0000 mg | ORAL_TABLET | Freq: Every day | ORAL | 11 refills | Status: DC

## 2023-12-13 NOTE — ED Provider Notes (Signed)
 [facility] Provider Note   Event Date/Time   First MD Initiated Contact with Patient 12/13/23 2110     (approximate) History  Palpitations  HPI Dana Livingston is a 53 y.o. female who presents for palpitations.  Patient states she has a history of difficulty control hypertension, anxiety with panic attacks, and recurrent cramping in the right lower extremity.  Patient states that she was having a presyncopal episode which caused her to present to the emergency department however she has not had any further episodes.  Patient states that she took her prescribed Klonopin  for anxiety and the palpitations did not improve.  Patient denies any symptoms similar to this in the past.  Patient does endorse new blood pressure medication of hydrochlorothiazide . ROS: Patient currently denies any vision changes, tinnitus, difficulty speaking, facial droop, sore throat, chest pain, shortness of breath, abdominal pain, nausea/vomiting/diarrhea, dysuria, or weakness/numbness/paresthesias in any extremity   Physical Exam  Triage Vital Signs: ED Triage Vitals  Encounter Vitals Group     BP 12/13/23 1938 (!) 188/100     Systolic BP Percentile --      Diastolic BP Percentile --      Pulse Rate 12/13/23 1938 79     Resp 12/13/23 1938 20     Temp 12/13/23 1938 98.8 F (37.1 C)     Temp Source 12/13/23 1938 Oral     SpO2 12/13/23 1938 99 %     Weight 12/13/23 1938 204 lb (92.5 kg)     Height 12/13/23 1938 5\' 4"  (1.626 m)     Head Circumference --      Peak Flow --      Pain Score 12/13/23 1958 0     Pain Loc --      Pain Education --      Exclude from Growth Chart --    Most recent vital signs: Vitals:   12/13/23 1938 12/13/23 2339  BP: (!) 188/100 (!) 159/86  Pulse: 79 68  Resp: 20 14  Temp: 98.8 F (37.1 C)   SpO2: 99% 98%   General: Awake, oriented x4. CV:  Good peripheral perfusion. Resp:  Normal effort. Abd:  No distention. Other:  Middle-aged obese Caucasian  female resting comfortably in no acute distress ED Results / Procedures / Treatments  Labs (all labs ordered are listed, but only abnormal results are displayed) Labs Reviewed  BASIC METABOLIC PANEL WITH GFR - Abnormal; Notable for the following components:      Result Value   Sodium 130 (*)    Potassium 3.3 (*)    Chloride 95 (*)    Glucose, Bld 168 (*)    All other components within normal limits  CBC  POC URINE PREG, ED  TROPONIN I (HIGH SENSITIVITY)   EKG ED ECG REPORT I, Charleen Conn, the attending physician, personally viewed and interpreted this ECG. Date: 12/13/2023 EKG Time: 1943 Rate: 82 Rhythm: normal sinus rhythm QRS Axis: normal Intervals: normal ST/T Wave abnormalities: normal Narrative Interpretation: no evidence of acute ischemia RADIOLOGY ED MD interpretation: 2 view chest x-ray interpreted by me shows no evidence of acute abnormalities including no pneumonia, pneumothorax, or widened mediastinum - All radiology independently interpreted and agree with radiology assessment Official radiology report(s): No results found.  PROCEDURES: Critical Care performed: No .1-3 Lead EKG Interpretation  Performed by: Charleen Conn, MD Authorized by: Charleen Conn, MD     Interpretation: normal     ECG rate:  71  ECG rate assessment: normal     Rhythm: sinus rhythm     Ectopy: none     Conduction: normal    MEDICATIONS ORDERED IN ED: Medications  sodium chloride  0.9 % bolus 1,000 mL (0 mLs Intravenous Stopped 12/13/23 2339)  potassium chloride 10 mEq in 100 mL IVPB (0 mEq Intravenous Stopped 12/13/23 2339)   IMPRESSION / MDM / ASSESSMENT AND PLAN / ED COURSE  I reviewed the triage vital signs and the nursing notes.                             The patient is on the cardiac monitor to evaluate for evidence of arrhythmia and/or significant heart rate changes. Patient's presentation is most consistent with acute presentation with potential threat to life or  bodily function. 52 year old female presents with palpitations. EKG: No STEMI and no evidence of Brugada's sign, delta wave, epsilon wave, significantly prolonged QTc, or malignant arrhythmia. Based on H&P and testing, this patient appears to be low risk for emergent causes of palpitations such as, but not limited to, a malignant cardiac arrhythmia, ACS, pulmonary embolism, thyrotoxicosis, PNA, PTX. Patient does show signs of hyponatremia and mild hypokalemia.  Patient has received 1 L normal saline as well as 10 mill equivalents of potassium chloride for replacement.  Patient was also encouraged to continue oral electrolyte replacement at home. Patient also request to change her home blood pressure medication and therefore we will recommend discontinuing hydrochlorothiazide  and start losartan as patient has had multiple side effects to other blood pressure medications. The patient has been given strict return precautions and understands the need for further outpatient testing and treatment.   FINAL CLINICAL IMPRESSION(S) / ED DIAGNOSES   Final diagnoses:  Palpitations  PVC (premature ventricular contraction)  Hypokalemia  Hyponatremia   Rx / DC Orders   ED Discharge Orders          Ordered    losartan (COZAAR) 50 MG tablet  Daily        12/13/23 2346           Note:  This document was prepared using Dragon voice recognition software and may include unintentional dictation errors.   Ariah Mower K, MD 12/14/23 [id number]

## 2023-12-13 NOTE — ED Triage Notes (Signed)
 Pt reports she was having a "charlie horse"and as she was recovering from it and started to have palpitations and felt like passing out. Pt took some Klonopin  for anxiety and palpitations did not improve. Pt talks in complete sentences no respiratory distress noted

## 2023-12-15 ENCOUNTER — Telehealth: Payer: Self-pay

## 2023-12-15 NOTE — Telephone Encounter (Signed)
 Copied from CRM [id number]. Topic: Clinical - Medical Advice >> Dec 15, 2023 11:51 AM Corin V wrote: Reason for CRM: Patient was seen in the [facility] emergency room Friday. She was switched to losartan and they stopped the hydrochlorothiazide . She wanted to verify that this was okay with her. She is set to pick them up today from the pharmacy. She does not think an emergency room is necessary but will come in if [facility] requests.

## 2023-12-15 NOTE — Telephone Encounter (Signed)
 Ok to switch to losartan from hydrochlorothiazide  as directed and follow up with PCP for HTN

## 2023-12-16 NOTE — Telephone Encounter (Signed)
 Spoke with pt and made aware. Patient requested she would like to come in to speak with Dr. Geraldene Kleine. Scheduled pt for 6/12 at 1:20pm

## 2023-12-25 ENCOUNTER — Encounter: Payer: Self-pay | Admitting: Family Medicine

## 2023-12-25 ENCOUNTER — Ambulatory Visit: Admitting: Family Medicine

## 2023-12-25 VITALS — BP 132/75 | HR 87 | Ht 64.0 in | Wt 202.8 lb

## 2023-12-25 DIAGNOSIS — I10 Essential (primary) hypertension: Secondary | ICD-10-CM

## 2023-12-25 DIAGNOSIS — R002 Palpitations: Secondary | ICD-10-CM | POA: Diagnosis not present

## 2023-12-25 DIAGNOSIS — E871 Hypo-osmolality and hyponatremia: Secondary | ICD-10-CM | POA: Diagnosis not present

## 2023-12-25 DIAGNOSIS — E876 Hypokalemia: Secondary | ICD-10-CM | POA: Diagnosis not present

## 2023-12-25 MED ORDER — LOSARTAN POTASSIUM 50 MG PO TABS: 50.0000 mg | ORAL_TABLET | Freq: Every day | ORAL | 3 refills | Status: AC

## 2023-12-25 NOTE — Progress Notes (Signed)
 Established patient visit   Patient: Dana Livingston   DOB: 1971/03/17   53 y.o. Female  MRN: [id number] Visit Date: 12/25/2023  Today's healthcare provider: Aden Agreste, MD   Chief Complaint  Patient presents with   Medical Management of Chronic Issues    Patient is present for blood pressure follow-up and to follow-up in regards to ER visit on 12/13/23 due to palpitations.    Hypertension    Pt hydrochlorothiazide  d/c at ER visit and started taking losartan  50 mg starting 3 days after visit on a Tuesday. She reports monitoring at home and no smoking. She reports no palpitations since visit. Patient reports three different home reading since starting new rx 12/18/23 132/80 P 59, 12/19/23 133/85 P 61, 12/24/23 130/82 63P   Care Management    Mammogram - declined   Subjective    Hypertension   HPI     Medical Management of Chronic Issues    Additional comments: Patient is present for blood pressure follow-up and to follow-up in regards to ER visit on 12/13/23 due to palpitations.         Hypertension    Additional comments: Pt hydrochlorothiazide  d/c at ER visit and started taking losartan  50 mg starting 3 days after visit on a Tuesday. She reports monitoring at home and no smoking. She reports no palpitations since visit. Patient reports three different home reading since starting new rx 12/18/23 132/80 P 59, 12/19/23 133/85 P 61, 12/24/23 130/82 63P        Care Management    Additional comments: Mammogram - declined      Last edited by Pasty Bongo, CMA on 12/25/2023  1:13 PM.       Discussed the use of AI scribe software for clinical note transcription with the patient, who gave verbal consent to proceed.  History of Present Illness   Dana Livingston is a 53 year old female with hypertension who presents with palpitations and near syncope.  She experienced a sudden onset of a charley horse in her leg followed by near syncope while preparing to attend a  protest and dinner. She felt dizzy and had to lie down to prevent passing out. Her palpitations were different from her usual experience, with clearly felt skipped beats. Her husband confirmed the irregularity, prompting a visit to the ER.  In the ER, blood work revealed low potassium and sodium levels. She received IV fluids and potassium supplementation, resolving her symptoms within 24 hours. Hydrochlorothiazide  was discontinued due to its contribution to her electrolyte imbalance. She was started on losartan  and is currently monitoring her blood pressure at home, which has been stable in the 130s.  A previous EKG anomaly noted in 2023 was mentioned as a slight anomaly but within some people's normal range. During her recent ER visit, a similar anomaly was noted. She has started aerobic exercise, aiming for 150 minutes per week, and is gradually increasing her activity level.  She is actively trying to lose weight, aiming for a waist circumference goal of 35 inches. She is seven months post-menopausal and has gained weight, which she attributes to menopause. A recent x-ray indicated slight kyphoscoliosis, which she attributes to slouching during the procedure. She is concerned about her posture and is considering core exercises to improve it.         Medications: Outpatient Medications Prior to Visit  Medication Sig   Azelaic Acid  15 % gel After skin is thoroughly washed and patted dry, gently but  thoroughly massage a thin film of azelaic acid  cream into the affected area daily to twice daily, in the morning   cetirizine  (ZYRTEC ) 10 MG tablet Take 1 tablet (10 mg total) by mouth daily.   Chaste Tree (VITEX EXTRACT PO) Take 2 capsules by mouth daily.    Cholecalciferol (VITAMIN D ) 2000 units CAPS Take 1 capsule by mouth daily.    clonazePAM  (KLONOPIN ) 0.5 MG tablet Take 0.5-1 tablets (0.25-0.5 mg total) by mouth daily as needed for anxiety.   doxylamine, Sleep, (UNISOM) 25 MG tablet Take 25 mg  by mouth at bedtime as needed.   fluticasone  (FLONASE ) 50 MCG/ACT nasal spray Place 2 sprays into both nostrils daily. prn   GARLIC PO Take by mouth.   Ivermectin  (SOOLANTRA ) 1 % CREA Apply to the face QHS for Rosacea   Multiple Vitamin (MULTIVITAMIN) capsule Take 1 capsule by mouth daily.    norethindrone -ethinyl estradiol  (FYAVOLV) 0.5-2.5 MG-MCG tablet TAKE 1 TABLET BY MOUTH Daily   Omega-3 Fatty Acids (OMEGA 3 500) 500 MG CAPS Take 525 mg by mouth. VEGAN   PASSION FLOWER-VALERIAN PO Take by mouth.    Sodium Hyaluronate, oral, (HYALURONIC ACID) 100 MG CAPS Take by mouth.   TURMERIC PO Take 2 tablets by mouth daily.    [DISCONTINUED] losartan  (COZAAR ) 50 MG tablet Take 1 tablet (50 mg total) by mouth daily.   No facility-administered medications prior to visit.    Review of Systems     Objective    BP 132/75 Comment: home readings  Pulse 87   Ht 5' 4 (1.626 m)   Wt 202 lb 12.8 oz (92 kg)   SpO2 99%   BMI 34.81 kg/m    Physical Exam Vitals reviewed.  Constitutional:      General: She is not in acute distress.    Appearance: Normal appearance. She is well-developed. She is not diaphoretic.  HENT:     Head: Normocephalic and atraumatic.   Eyes:     General: No scleral icterus.    Conjunctiva/sclera: Conjunctivae normal.   Neck:     Thyroid: No thyromegaly.   Cardiovascular:     Rate and Rhythm: Normal rate and regular rhythm.     Heart sounds: Normal heart sounds. No murmur heard. Pulmonary:     Effort: Pulmonary effort is normal. No respiratory distress.     Breath sounds: Normal breath sounds. No wheezing, rhonchi or rales.   Musculoskeletal:     Cervical back: Neck supple.     Right lower leg: No edema.     Left lower leg: No edema.  Lymphadenopathy:     Cervical: No cervical adenopathy.   Skin:    General: Skin is warm and dry.     Findings: No rash.   Neurological:     Mental Status: She is alert and oriented to person, place, and time. Mental  status is at baseline.   Psychiatric:        Mood and Affect: Mood normal.        Behavior: Behavior normal.      Results for orders placed or performed in visit on 12/25/23  Basic Metabolic Panel (BMET)  Result Value Ref Range   Glucose 106 (H) 70 - 99 mg/dL   BUN 10 6 - 24 mg/dL   Creatinine, Ser 1.61 0.57 - 1.00 mg/dL   eGFR 98 >09 UE/AVW/0.98   BUN/Creatinine Ratio 14 9 - 23   Sodium 136 134 - 144 mmol/L   Potassium 4.4 3.5 -  5.2 mmol/L   Chloride 100 96 - 106 mmol/L   CO2 22 20 - 29 mmol/L   Calcium 9.6 8.7 - 10.2 mg/dL    Assessment & Plan     Problem List Items Addressed This Visit       Cardiovascular and Mediastinum   Hypertension - Primary   Hypertension previously managed with hydrochlorothiazide , which led to electrolyte imbalance. Switched to losartan , currently maintaining blood pressure in the 130s. She is concerned about potential hyperkalemia with losartan , but reassured about its rarity. Encouraged to continue home blood pressure monitoring and maintain a healthy lifestyle to aid in blood pressure management. - Continue losartan  with monitoring of blood pressure and electrolytes. - Encourage weight loss and exercise to aid in blood pressure management.      Relevant Medications   losartan  (COZAAR ) 50 MG tablet   Other Relevant Orders   Basic Metabolic Panel (BMET) (Completed)   Other Visit Diagnoses       Hypokalemia       Relevant Orders   Basic Metabolic Panel (BMET) (Completed)     Hyponatremia       Relevant Orders   Basic Metabolic Panel (BMET) (Completed)     Palpitation               Electrolyte imbalance Recent episode of electrolyte imbalance with hypokalemia and hyponatremia, leading to palpitations and near syncope. Resolved after IV fluids and potassium supplementation. Currently monitoring electrolytes due to recent switch from hydrochlorothiazide  to losartan , which can cause hyperkalemia, although this is rare and usually  associated with renal tubular acidosis. She is aware of the potential risk of hyperkalemia with losartan  and is concerned, but reassured about its rarity. - Check electrolytes today to ensure normalization post-hydrochlorothiazide  cessation. - Recheck electrolytes in 2-4 weeks after starting losartan . - Monitor electrolytes every 6 months.  Palpitations Palpitations associated with recent electrolyte imbalance, now resolved after treatment. No evidence of myocardial infarction despite EKG anomaly suggesting septal infarct, attributed to electrolyte abnormalities and lead placement issues. She is reassured about EKG findings and understands the importance of monitoring for any recurrence of symptoms. - Monitor for recurrence of palpitations. - Reassure regarding EKG findings and continue regular monitoring.        Return in about 2 months (around 02/24/2024) for as scheduled.       Aden Agreste, MD  [facility] [phone] (phone) [phone] (fax)  [facility]

## 2023-12-26 ENCOUNTER — Ambulatory Visit: Payer: Self-pay | Admitting: Family Medicine

## 2023-12-26 LAB — BASIC METABOLIC PANEL WITH GFR
BUN/Creatinine Ratio: 14 (ref 9–23)
BUN: 10 mg/dL (ref 6–24)
CO2: 22 mmol/L (ref 20–29)
Calcium: 9.6 mg/dL (ref 8.7–10.2)
Chloride: 100 mmol/L (ref 96–106)
Creatinine, Ser: 0.73 mg/dL (ref 0.57–1.00)
Glucose: 106 mg/dL — ABNORMAL HIGH (ref 70–99)
Potassium: 4.4 mmol/L (ref 3.5–5.2)
Sodium: 136 mmol/L (ref 134–144)
eGFR: 98 mL/min/{1.73_m2} (ref >59)

## 2023-12-26 NOTE — Assessment & Plan Note (Signed)
 Hypertension previously managed with hydrochlorothiazide , which led to electrolyte imbalance. Switched to losartan , currently maintaining blood pressure in the 130s. She is concerned about potential hyperkalemia with losartan , but reassured about its rarity. Encouraged to continue home blood pressure monitoring and maintain a healthy lifestyle to aid in blood pressure management. - Continue losartan  with monitoring of blood pressure and electrolytes. - Encourage weight loss and exercise to aid in blood pressure management.

## 2023-12-30 ENCOUNTER — Ambulatory Visit: Payer: 59 | Admitting: Dermatology

## 2024-02-09 ENCOUNTER — Ambulatory Visit: Payer: Self-pay

## 2024-02-09 ENCOUNTER — Ambulatory Visit: Attending: Family Medicine

## 2024-02-09 ENCOUNTER — Ambulatory Visit: Admitting: Family Medicine

## 2024-02-09 VITALS — BP 120/80 | HR 65 | Ht 64.0 in | Wt 201.1 lb

## 2024-02-09 DIAGNOSIS — F419 Anxiety disorder, unspecified: Secondary | ICD-10-CM | POA: Diagnosis not present

## 2024-02-09 DIAGNOSIS — R002 Palpitations: Secondary | ICD-10-CM | POA: Diagnosis not present

## 2024-02-09 DIAGNOSIS — I10 Essential (primary) hypertension: Secondary | ICD-10-CM

## 2024-02-09 MED ORDER — SERTRALINE HCL 50 MG PO TABS: 50.0000 mg | ORAL_TABLET | Freq: Every day | ORAL | 3 refills | Status: DC

## 2024-02-09 NOTE — Telephone Encounter (Signed)
 FYI Only or Action Required?: FYI only for provider.  Patient was last seen in primary care on 12/25/2023 by Dana Jon HERO, MD.  Called Nurse Triage reporting Palpitations.  Symptoms began today.  Interventions attempted: Nothing.  Symptoms are: gradually improving.  Triage Disposition: See PCP Within 2 Weeks  Patient/caregiver understands and will follow disposition?: yes           Copied from CRM #[id number]. Topic: Clinical - Red Word Triage >> Feb 09, 2024  8:02 AM [name] wrote: [facility] that prompted transfer to Nurse Triage: Patient states the EMS came to her house this morning due to her having skipped heart beats. States they ran a few tests but she is still experiencing the symptom. Reason for Disposition  Palpitations are a chronic symptom (recurrent or ongoing AND present > 4 weeks)  Answer Assessment - Initial Assessment Questions 1. DESCRIPTION: Please describe your heart rate or heartbeat that you are having (e.g., fast/slow, regular/irregular, skipped or extra beats, palpitations)     Skipped beats  2. ONSET: When did it start? (e.g., minutes, hours, days)      This am  Sat had episode - pt attributed to spicy foods 3. DURATION: How long does it last (e.g., seconds, minutes, hours)     15-20 minutes  4. PATTERN Does it come and go, or has it been constant since it started?  Does it get worse with exertion?   Are you feeling it now?     Comes and goes   6. HEART RATE: Can you tell me your heart rate? How many beats in 15 seconds?  Note: Not all patients can do this.       64 bpm 7. RECURRENT SYMPTOM: Have you ever had this before? If Yes, ask: When was the last time? and What happened that time?      Yes  8. CAUSE: What do you think is causing the palpitations?     K too high  9. CARDIAC HISTORY: Do you have any history of heart disease? (e.g., heart attack, angina, bypass surgery, angioplasty, arrhythmia)      2 mo  skipped HR, occasional dizzy spells 10. OTHER SYMPTOMS: Do you have any other symptoms? (e.g., dizziness, chest pain, sweating, difficulty breathing)       anxiety  Protocols used: Heart Rate and Heartbeat Questions-A-AH

## 2024-02-09 NOTE — Telephone Encounter (Signed)
 Noted

## 2024-02-09 NOTE — Assessment & Plan Note (Addendum)
 Currently not well managed with medication regimen. Patient is on clonazepam  as needed with more frequent use recently due to stressors. Reports that her anxiety has been worse with concerns about palpitations and life stressors. States that her normal level is just below panic. Open to trying daily treatment for better control of anxiety in conjunction with therapy. Previously tried Prozac years ago, but did not like side effects. GAD7 is 8 today. - Start Zoloft  50 mg daily - Continue clonazepam  0.25-0.50 mg daily prn - Reassess in 4-6 weeks for efficacy
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
## 2024-02-09 NOTE — Assessment & Plan Note (Signed)
 Current well-managed with losartan . Home readings are in the 120s-130s/80s. Denies any symptoms or side effects, but is concerned about hyperkalemia from the medication (after experiencing medication side effects from hydrochlorothiazide ). Last BMP was wnl. - Continue Losartan  50 mg daily - Order CMP to check electrolyte status

## 2024-02-10 ENCOUNTER — Ambulatory Visit: Payer: Self-pay | Admitting: Family Medicine

## 2024-02-10 LAB — COMPREHENSIVE METABOLIC PANEL WITH GFR
ALT: 24 IU/L (ref 0–32)
AST: 22 IU/L (ref 0–40)
Albumin: 4.5 g/dL (ref 3.8–4.9)
Alkaline Phosphatase: 80 IU/L (ref 44–121)
BUN/Creatinine Ratio: 17 (ref 9–23)
BUN: 13 mg/dL (ref 6–24)
Bilirubin Total: 0.3 mg/dL (ref 0.0–1.2)
CO2: 21 mmol/L (ref 20–29)
Calcium: 9.8 mg/dL (ref 8.7–10.2)
Chloride: 102 mmol/L (ref 96–106)
Creatinine, Ser: 0.76 mg/dL (ref 0.57–1.00)
Globulin, Total: 2.8 g/dL (ref 1.5–4.5)
Glucose: 96 mg/dL (ref 70–99)
Potassium: 4.4 mmol/L (ref 3.5–5.2)
Sodium: 138 mmol/L (ref 134–144)
Total Protein: 7.3 g/dL (ref 6.0–8.5)
eGFR: 93 mL/min/1.73 (ref >59)

## 2024-02-10 LAB — CBC
Hematocrit: 43.5 % (ref 34.0–46.6)
Hemoglobin: 14.1 g/dL (ref 11.1–15.9)
MCH: 32.4 pg (ref 26.6–33.0)
MCHC: 32.4 g/dL (ref 31.5–35.7)
MCV: 100 fL — ABNORMAL HIGH (ref 79–97)
Platelets: 331 x10E3/uL (ref 150–450)
RBC: 4.35 x10E6/uL (ref 3.77–5.28)
RDW: 12 % (ref 11.7–15.4)
WBC: 7.8 x10E3/uL (ref 3.4–10.8)

## 2024-02-10 LAB — MAGNESIUM: Magnesium: 2.2 mg/dL (ref 1.6–2.3)

## 2024-02-10 LAB — TSH: TSH: 2.05 u[IU]/mL (ref 0.450–4.500)

## 2024-02-18 LAB — BASIC METABOLIC PANEL WITH GFR
Creatinine: 0.8 (ref 0.5–1.1)
Glucose: 86

## 2024-02-18 LAB — LIPID PANEL
Cholesterol: 151 (ref 0–200)
HDL: 38 (ref 35–70)
LDL Cholesterol: 97
Triglycerides: 86 (ref 40–160)

## 2024-02-18 LAB — COMPREHENSIVE METABOLIC PANEL WITH GFR: eGFR: 95

## 2024-02-18 LAB — HEMOGLOBIN A1C: Hemoglobin A1C: 5.5

## 2024-03-02 ENCOUNTER — Encounter: Payer: Self-pay | Admitting: Family Medicine

## 2024-03-02 ENCOUNTER — Ambulatory Visit (INDEPENDENT_AMBULATORY_CARE_PROVIDER_SITE_OTHER): Payer: BC Managed Care – PPO | Admitting: Family Medicine

## 2024-03-02 VITALS — BP 124/82 | HR 71 | Ht 64.0 in | Wt 203.0 lb

## 2024-03-02 DIAGNOSIS — F410 Panic disorder [episodic paroxysmal anxiety] without agoraphobia: Secondary | ICD-10-CM

## 2024-03-02 DIAGNOSIS — Z0000 Encounter for general adult medical examination without abnormal findings: Secondary | ICD-10-CM

## 2024-03-02 DIAGNOSIS — R002 Palpitations: Secondary | ICD-10-CM

## 2024-03-02 DIAGNOSIS — E66811 Obesity, class 1: Secondary | ICD-10-CM

## 2024-03-02 DIAGNOSIS — Z6834 Body mass index (BMI) 34.0-34.9, adult: Secondary | ICD-10-CM

## 2024-03-02 DIAGNOSIS — F419 Anxiety disorder, unspecified: Secondary | ICD-10-CM

## 2024-03-02 DIAGNOSIS — I10 Essential (primary) hypertension: Secondary | ICD-10-CM

## 2024-03-02 MED ORDER — CLONAZEPAM 0.5 MG PO TABS: 0.2500 mg | ORAL_TABLET | Freq: Every day | ORAL | 2 refills | Status: AC | PRN

## 2024-03-02 NOTE — Assessment & Plan Note (Signed)
 Blood pressure is generally well-controlled with a recent reading of 140/85 mmHg. - Continue current antihypertensive regimen - Monitor blood pressure at home

## 2024-03-02 NOTE — Progress Notes (Signed)
 Complete physical exam   Patient: Dana Livingston   DOB: 1970/09/15   53 y.o. Female  MRN: [id number] Visit Date: 03/02/2024  Today's healthcare provider: Jon Eva, MD   Chief Complaint  Patient presents with   Annual Exam   Care Management    Pattern of eating:Vegan   Are you exercising:No    Vaccine: denied ( she will have them all done at the pharmacy)       Subjective    Dana Livingston is a 53 y.o. female who presents today for a complete physical exam.   Discussed the use of AI scribe software for clinical note transcription with the patient, who gave verbal consent to proceed.  History of Present Illness   Dana Livingston is a 53 year old female who presents for a routine physical exam and follow-up on hypertension and heart palpitations.  Her blood pressure readings are around 140/85 mmHg, which she considers satisfactory. She attributes a previous high reading to stress. She monitors her blood pressure at home.  She experiences heart palpitations, with four episodes on the seventh of the month and one on the tenth, which resolved spontaneously. No palpitations have occurred since last Sunday. She uses vagus nerve stimulation techniques to manage these episodes.  She recently discontinued sertraline  due to side effects, including dry mouth, sleep disturbances, nausea, and dizziness. Nausea and dizziness have resolved, but sleep disturbances persist. She occasionally uses Unisom for sleep. She is currently taking clonazepam  and has reduced its use as her anxiety has decreased.  She is 300 days without a period and is hopeful she is nearing menopause. She uses birth control as hormone replacement therapy, prescribed by her gynecologist.        Last depression screening scores    02/09/2024    8:54 AM 12/25/2023    1:07 PM 09/01/2023    3:42 PM  PHQ 2/9 Scores  PHQ - 2 Score 0 0 1  PHQ- 9 Score 1  2   Last fall risk screening    12/25/2023    1:07  PM  Fall Risk   Falls in the past year? 0  Number falls in past yr: 0  Injury with Fall? 0  Risk for fall due to : No Fall Risks  Follow up Falls evaluation completed        Medications: Outpatient Medications Prior to Visit  Medication Sig   Azelaic Acid  15 % gel After skin is thoroughly washed and patted dry, gently but thoroughly massage a thin film of azelaic acid  cream into the affected area daily to twice daily, in the morning   cetirizine  (ZYRTEC ) 10 MG tablet Take 1 tablet (10 mg total) by mouth daily.   Chaste Tree (VITEX EXTRACT PO) Take 2 capsules by mouth daily.    Cholecalciferol (VITAMIN D ) 2000 units CAPS Take 1 capsule by mouth daily.    doxylamine, Sleep, (UNISOM) 25 MG tablet Take 25 mg by mouth at bedtime as needed.   fluticasone  (FLONASE ) 50 MCG/ACT nasal spray Place 2 sprays into both nostrils daily. prn   GARLIC PO Take by mouth.   Ivermectin  (SOOLANTRA ) 1 % CREA Apply to the face QHS for Rosacea   losartan  (COZAAR ) 50 MG tablet Take 1 tablet (50 mg total) by mouth daily.   Multiple Vitamin (MULTIVITAMIN) capsule Take 1 capsule by mouth daily.    norethindrone -ethinyl estradiol  (FYAVOLV) 0.5-2.5 MG-MCG tablet TAKE 1 TABLET BY MOUTH Daily   Omega-3 Fatty Acids (OMEGA 3  500) 500 MG CAPS Take 525 mg by mouth. VEGAN   PASSION FLOWER-VALERIAN PO Take by mouth.    Sodium Hyaluronate, oral, (HYALURONIC ACID) 100 MG CAPS Take by mouth.   TURMERIC PO Take 2 tablets by mouth daily.    [DISCONTINUED] clonazePAM  (KLONOPIN ) 0.5 MG tablet Take 0.5-1 tablets (0.25-0.5 mg total) by mouth daily as needed for anxiety.   [DISCONTINUED] sertraline  (ZOLOFT ) 50 MG tablet Take 1 tablet (50 mg total) by mouth daily.   No facility-administered medications prior to visit.    Review of Systems    Objective    BP 124/82 Comment: home readings  Pulse 71   Ht 5' 4 (1.626 m)   Wt 203 lb (92.1 kg)   SpO2 98%   BMI 34.84 kg/m    Physical Exam Vitals reviewed.   Constitutional:      General: She is not in acute distress.    Appearance: Normal appearance. She is well-developed. She is not diaphoretic.  HENT:     Head: Normocephalic and atraumatic.     Right Ear: Tympanic membrane, ear canal and external ear normal.     Left Ear: Tympanic membrane, ear canal and external ear normal.     Nose: Nose normal.     Mouth/Throat:     Mouth: Mucous membranes are moist.     Pharynx: Oropharynx is clear. No oropharyngeal exudate.  Eyes:     General: No scleral icterus.    Conjunctiva/sclera: Conjunctivae normal.     Pupils: Pupils are equal, round, and reactive to light.  Neck:     Thyroid: No thyromegaly.  Cardiovascular:     Rate and Rhythm: Normal rate and regular rhythm.     Heart sounds: Normal heart sounds. No murmur heard. Pulmonary:     Effort: Pulmonary effort is normal. No respiratory distress.     Breath sounds: Normal breath sounds. No wheezing or rales.  Abdominal:     General: There is no distension.     Palpations: Abdomen is soft.     Tenderness: There is no abdominal tenderness.  Musculoskeletal:        General: No deformity.     Cervical back: Neck supple.     Right lower leg: No edema.     Left lower leg: No edema.  Lymphadenopathy:     Cervical: No cervical adenopathy.  Skin:    General: Skin is warm and dry.     Findings: No rash.  Neurological:     Mental Status: She is alert and oriented to person, place, and time. Mental status is at baseline.     Gait: Gait normal.  Psychiatric:        Mood and Affect: Mood is anxious.        Behavior: Behavior normal.        Thought Content: Thought content normal.      No results found for any visits on 03/02/24.  Assessment & Plan    Routine Health Maintenance and Physical Exam  Exercise Activities and Dietary recommendations  Goals   None     Immunization History  Administered Date(s) Administered   Influenza,inj,Quad PF,6+ Mos 03/11/2019, 03/13/2020, 05/24/2022    Moderna Covid-19 Vaccine Bivalent Booster 65yrs & up 05/04/2021, 05/24/2022   PFIZER(Purple Top)SARS-COV-2 Vaccination 08/26/2019, 09/16/2019, 04/28/2020   Tdap 03/04/2018   Zoster Recombinant(Shingrix) 03/15/2021, 09/13/2021    Health Maintenance  Topic Date Due   Hepatitis B Vaccines 19-59 Average Risk (1 of 3 - 19+ 3-dose series)  Never done   Pneumococcal Vaccine: 50+ Years (1 of 1 - PCV) Never done   MAMMOGRAM  Never done   COVID-19 Vaccine (6 - 2024-25 season) 03/16/2023   INFLUENZA VACCINE  10/12/2024 (Originally 02/13/2024)   Fecal DNA (Cologuard)  04/11/2024   DTaP/Tdap/Td (2 - Td or Tdap) 03/04/2028   Cervical Cancer Screening (HPV/Pap Cotest)  11/10/2028   Hepatitis C Screening  Completed   HIV Screening  Completed   Zoster Vaccines- Shingrix  Completed   HPV VACCINES  Aged Out   Meningococcal B Vaccine  Aged Out    Discussed health benefits of physical activity, and encouraged her to engage in regular exercise appropriate for her age and condition.  Problem List Items Addressed This Visit       Cardiovascular and Mediastinum   Hypertension   Blood pressure is generally well-controlled with a recent reading of 140/85 mmHg. - Continue current antihypertensive regimen - Monitor blood pressure at home        Other   Anxiety   Panic attacks   Obesity   Other Visit Diagnoses       Encounter for annual physical exam    -  Primary     Palpitations               Adult Wellness Visit Routine adult wellness visit with satisfactory blood work results. Discussed vaccination updates and general health maintenance. Hepatitis B immunity status uncertain. - Perform physical exam - Discuss flu shot availability in early October - Discuss pneumonia vaccination eligibility for age 70+ - Discuss COVID vaccination availability - Review tetanus vaccination status, next due in 2029 - Consider hepatitis B immunity testing during next lab work  Heart  palpitations Intermittent heart palpitations with episodes captured during monitoring. No recent episodes since last Sunday. Discussed potential vagus nerve involvement and non-pharmacological interventions. - Review monitoring results with Dr. Cindie - Consider treatment based on findings - Educate on carotid massage for vagus nerve stimulation  Insomnia following sertraline  discontinuation Insomnia persists following sertraline  discontinuation. Previous side effects included dry mouth, nausea, dizziness, and sleep disturbances. Symptoms are gradually resolving. - Consider magnesium supplementation for sleep - Consider half a Unisom for sleep disturbances as needed - Avoid starting new medications for insomnia at this time  Perimenopausal symptoms on hormone replacement therapy Perimenopausal symptoms managed with hormone replacement therapy. No recent menstrual period for 300 days, approaching menopause. Discussed potential for menstrual return before 35-month mark. - Continue current hormone replacement therapy - Monitor for any menstrual changes        Return in about 6 months (around 09/02/2024) for chronic disease f/u.     Jon Eva, MD  [facility] [phone] (phone) [phone] (fax)  [facility]

## 2024-03-06 DIAGNOSIS — R002 Palpitations: Secondary | ICD-10-CM | POA: Diagnosis not present

## 2024-03-13 DIAGNOSIS — R002 Palpitations: Secondary | ICD-10-CM | POA: Diagnosis not present

## 2024-03-31 DIAGNOSIS — H0288A Meibomian gland dysfunction right eye, upper and lower eyelids: Secondary | ICD-10-CM | POA: Diagnosis not present

## 2024-03-31 DIAGNOSIS — H16223 Keratoconjunctivitis sicca, not specified as Sjogren's, bilateral: Secondary | ICD-10-CM | POA: Diagnosis not present

## 2024-03-31 DIAGNOSIS — H0288B Meibomian gland dysfunction left eye, upper and lower eyelids: Secondary | ICD-10-CM | POA: Diagnosis not present

## 2024-09-06 ENCOUNTER — Ambulatory Visit: Admitting: Family Medicine

## 2024-10-05 ENCOUNTER — Encounter: Admitting: Dermatology

## 2024-10-26 ENCOUNTER — Encounter: Admitting: Dermatology
